# Patient Record
Sex: Female | Born: 1990 | Race: White | Hispanic: No | Marital: Married | Smoking: Never smoker
Health system: Southern US, Community
[De-identification: ages and names within clinical notes are randomized; demographics above are authoritative.]

## PROBLEM LIST (undated history)

## (undated) DIAGNOSIS — E079 Disorder of thyroid, unspecified: Secondary | ICD-10-CM

## (undated) DIAGNOSIS — K219 Gastro-esophageal reflux disease without esophagitis: Secondary | ICD-10-CM

## (undated) DIAGNOSIS — I10 Essential (primary) hypertension: Secondary | ICD-10-CM

## (undated) DIAGNOSIS — J309 Allergic rhinitis, unspecified: Secondary | ICD-10-CM

## (undated) DIAGNOSIS — E039 Hypothyroidism, unspecified: Secondary | ICD-10-CM

## (undated) DIAGNOSIS — O24419 Gestational diabetes mellitus in pregnancy, unspecified control: Secondary | ICD-10-CM

## (undated) HISTORY — PX: NO PAST SURGERIES: SHX2092

## (undated) HISTORY — DX: Gastro-esophageal reflux disease without esophagitis: K21.9

## (undated) HISTORY — DX: Disorder of thyroid, unspecified: E07.9

## (undated) HISTORY — DX: Gestational diabetes mellitus in pregnancy, unspecified control: O24.419

## (undated) HISTORY — DX: Allergic rhinitis, unspecified: J30.9

## (undated) HISTORY — DX: Hypothyroidism, unspecified: E03.9

## (undated) HISTORY — DX: Essential (primary) hypertension: I10

---
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
## 2012-04-29 ENCOUNTER — Emergency Department (HOSPITAL_COMMUNITY): Payer: 59

## 2012-04-29 ENCOUNTER — Encounter (HOSPITAL_COMMUNITY): Payer: Self-pay | Admitting: *Deleted

## 2012-04-29 ENCOUNTER — Emergency Department (HOSPITAL_COMMUNITY)
Admission: EM | Admit: 2012-04-29 | Discharge: 2012-04-29 | Disposition: A | Discharge status: Home / Self Care | Payer: 59 | Attending: Emergency Medicine | Admitting: Emergency Medicine

## 2012-04-29 DIAGNOSIS — J329 Chronic sinusitis, unspecified: Secondary | ICD-10-CM | POA: Insufficient documentation

## 2012-04-29 DIAGNOSIS — J069 Acute upper respiratory infection, unspecified: Secondary | ICD-10-CM | POA: Insufficient documentation

## 2012-04-29 MED ORDER — PHENYLEPHRINE-CHLORPHEN-DM 12.5-4-15 MG/5ML PO SYRP: 5.0000 mL | ORAL_SOLUTION | ORAL | Status: DC

## 2012-04-29 MED ORDER — PREDNISONE 10 MG PO TABS: 20.0000 mg | ORAL_TABLET | Freq: Every day | ORAL | Status: DC

## 2012-04-29 MED ORDER — PSEUDOEPHEDRINE HCL 60 MG PO TABS
60.0000 mg | ORAL_TABLET | Freq: Once | ORAL | Status: AC
  Administered 2012-04-29: 60 mg via ORAL
  Filled 2012-04-29: qty 1

## 2012-04-29 MED ORDER — PREDNISONE 50 MG PO TABS
60.0000 mg | ORAL_TABLET | Freq: Once | ORAL | Status: AC
  Administered 2012-04-29: 60 mg via ORAL
  Filled 2012-04-29: qty 1

## 2012-04-29 NOTE — ED Notes (Signed)
Pt states a cough for about a month but last 2 weeks cough productive of green and brown phlegm, possible fever yesterday per pt., lungs clear

## 2012-04-29 NOTE — ED Provider Notes (Signed)
History     CSN: [id number]  Arrival date & time 04/29/12  1339   First MD Initiated Contact with Patient 04/29/12 1346      Chief Complaint  Patient presents with  . Cough    (Consider location/radiation/quality/duration/timing/severity/associated sxs/prior treatment) Patient is a 22 y.o. female presenting with cough. The history is provided by the patient.  Cough Cough characteristics:  Productive Sputum characteristics:  Nondescript Severity:  Moderate Onset quality:  Gradual   History reviewed. No pertinent past medical history.  History reviewed. No pertinent past surgical history.  No family history on file.  History  Substance Use Topics  . Smoking status: Never Smoker   . Smokeless tobacco: Not on file  . Alcohol Use: No    OB History   Grav Para Term Preterm Abortions TAB SAB Ect Mult Living                  Review of Systems  Respiratory: Positive for cough.     Allergies  Review of patient's allergies indicates no known allergies.  Home Medications   Current Outpatient Rx  Name  Route  Sig  Dispense  Refill  . guaifenesin (ROBITUSSIN) 100 MG/5ML syrup   Oral   Take 200 mg by mouth 3 (three) times daily as needed for cough.         Marland Kitchen OVER THE COUNTER MEDICATION   Oral   Take 1 tablet by mouth daily as needed (for allergies). OTC allergy medication         . chlorpheniramine-phenylephrine-dextromethorphan (RONDEC DM) 12.05-20-13 MG/5ML SYRP   Oral   Take 5 mLs by mouth every 4 (four) hours.   100 mL   0   . predniSONE (DELTASONE) 10 MG tablet   Oral   Take 2 tablets (20 mg total) by mouth daily.   15 tablet   0     BP 123/73  Pulse 95  Temp(Src) 97.8 F (36.6 C) (Oral)  Resp 16  Ht 5\' 6"  (1.676 m)  Wt 250 lb (113.399 kg)  BMI 40.37 kg/m2  SpO2 99%  LMP 04/26/2012  Physical Exam  Nursing note and vitals reviewed. Constitutional: She is oriented to person, place, and time. She appears well-developed and well-nourished.   Non-toxic appearance.  HENT:  Head: Normocephalic.  Right Ear: Tympanic membrane and external ear normal.  Left Ear: Tympanic membrane and external ear normal.  Nasal congestion present. Airway patent.  Eyes: EOM and lids are normal. Pupils are equal, round, and reactive to light.  Neck: Normal range of motion. Neck supple. Carotid bruit is not present.  Cardiovascular: Normal rate, regular rhythm, normal heart sounds, intact distal pulses and normal pulses.   Pulmonary/Chest: Breath sounds normal. No respiratory distress.  Abdominal: Soft. Bowel sounds are normal. There is no tenderness. There is no guarding.  Musculoskeletal: Normal range of motion.  Lymphadenopathy:       Head (right side): No submandibular adenopathy present.       Head (left side): No submandibular adenopathy present.    She has no cervical adenopathy.  Neurological: She is alert and oriented to person, place, and time. She has normal strength. No cranial nerve deficit or sensory deficit.  Skin: Skin is warm and dry.  Psychiatric: She has a normal mood and affect. Her speech is normal.    ED Course  Procedures (including critical care time)  Labs Reviewed - No data to display Dg Chest 2 View  04/29/2012  *RADIOLOGY REPORT*  Clinical Data: Cough and congestion.  CHEST - 2 VIEW  Comparison:  None.  Findings:  The heart size and mediastinal contours are within normal limits.  Both lungs are clear.  The visualized skeletal structures are unremarkable.  IMPRESSION: No active cardiopulmonary disease.   Original Report Authenticated By: Richarda Overlie, M.D.      1. Sinusitis   2. URI (upper respiratory infection)       MDM  I have reviewed nursing notes, vital signs, and all appropriate lab and imaging results for this patient. Chest xray is negative for acute event. Pulse ox 99% on room air. Pt able to speak in complete sentences. Plan- Rx for Rondec DM and prednisone. Pt to return if not improving.  04/30/12 -- Pt  return to ED because pharmacy no longer carries Rondec DM. Rx for sudafed and Promethazine-codeine given to the patient.       Kathie Dike, PA-C 04/30/12 1949

## 2012-04-29 NOTE — ED Notes (Signed)
Cough x ~ 1 mo. Recently seen by PMD.

## 2012-05-01 NOTE — ED Provider Notes (Signed)
Medical screening examination/treatment/procedure(s) were performed by non-physician practitioner and as supervising physician I was immediately available for consultation/collaboration.   Bradley Bostelman W Jakaria Lavergne, MD 05/01/12 0724 

## 2012-05-19 ENCOUNTER — Emergency Department (HOSPITAL_COMMUNITY): Payer: 59

## 2012-05-19 ENCOUNTER — Emergency Department (HOSPITAL_COMMUNITY)
Admission: EM | Admit: 2012-05-19 | Discharge: 2012-05-19 | Disposition: A | Discharge status: Home / Self Care | Payer: 59 | Attending: Emergency Medicine | Admitting: Emergency Medicine

## 2012-05-19 ENCOUNTER — Encounter (HOSPITAL_COMMUNITY): Payer: Self-pay | Admitting: Emergency Medicine

## 2012-05-19 DIAGNOSIS — S2341XA Sprain of ribs, initial encounter: Secondary | ICD-10-CM | POA: Insufficient documentation

## 2012-05-19 DIAGNOSIS — R05 Cough: Secondary | ICD-10-CM | POA: Insufficient documentation

## 2012-05-19 DIAGNOSIS — IMO0002 Reserved for concepts with insufficient information to code with codable children: Secondary | ICD-10-CM | POA: Insufficient documentation

## 2012-05-19 DIAGNOSIS — S46912A Strain of unspecified muscle, fascia and tendon at shoulder and upper arm level, left arm, initial encounter: Secondary | ICD-10-CM

## 2012-05-19 DIAGNOSIS — S29011A Strain of muscle and tendon of front wall of thorax, initial encounter: Secondary | ICD-10-CM

## 2012-05-19 DIAGNOSIS — Y9289 Other specified places as the place of occurrence of the external cause: Secondary | ICD-10-CM | POA: Insufficient documentation

## 2012-05-19 DIAGNOSIS — X503XXA Overexertion from repetitive movements, initial encounter: Secondary | ICD-10-CM | POA: Insufficient documentation

## 2012-05-19 DIAGNOSIS — Y9389 Activity, other specified: Secondary | ICD-10-CM | POA: Insufficient documentation

## 2012-05-19 DIAGNOSIS — R059 Cough, unspecified: Secondary | ICD-10-CM | POA: Insufficient documentation

## 2012-05-19 DIAGNOSIS — X500XXA Overexertion from strenuous movement or load, initial encounter: Secondary | ICD-10-CM | POA: Insufficient documentation

## 2012-05-19 MED ORDER — IBUPROFEN 600 MG PO TABS: 600.0000 mg | ORAL_TABLET | Freq: Four times a day (QID) | ORAL | Status: DC | PRN

## 2012-05-19 NOTE — ED Notes (Signed)
Patient states that she is having left shoulder pain that started 2 days ago.  States that she did have a URI about 2 weeks ago, states that she has had an increased cough over the last several days.

## 2012-05-19 NOTE — ED Notes (Addendum)
Pt c/o left shoulder pain the radiates to upper back and left arm since yesterday.Pt denies injury. No deformity present.

## 2012-05-19 NOTE — ED Provider Notes (Signed)
History     CSN: [id number]  Arrival date & time 05/19/12  1227   First MD Initiated Contact with Patient 05/19/12 1252      Chief Complaint  Patient presents with  . Shoulder Pain    (Consider location/radiation/quality/duration/timing/severity/associated sxs/prior treatment) HPI Comments: Cheyenne Moore is a 22 y.o. Female presenting with a 2 day history of left shoulder pain which is intermittently worse with range of motion.  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  She does report having a URI several weeks ago with significant cough and upper respiratory congestion which had resolved, but she noticed a mild nonproductive cough which returned 2 days ago.  She denies shortness of breath and current nasal congestion, has had no sore throat, facial pain or pressure.  Her cough the past 2 days has been mild and nonproductive.  She has taken ibuprofen with  transient relief of her symptoms.   The history is provided by the patient.    History reviewed. No pertinent past medical history.  History reviewed. No pertinent past surgical history.  No family history on file.  History  Substance Use Topics  . Smoking status: Never Smoker   . Smokeless tobacco: Not on file  . Alcohol Use: No    OB History   Grav Para Term Preterm Abortions TAB SAB Ect Mult Living                  Review of Systems  Constitutional: Negative for fever.  HENT: Negative for congestion, sore throat, rhinorrhea, neck pain and sinus pressure.   Eyes: Negative.   Respiratory: Positive for cough. Negative for chest tightness and shortness of breath.   Cardiovascular: Negative for chest pain.  Gastrointestinal: Negative for nausea and abdominal pain.  Genitourinary: Negative.   Musculoskeletal: Positive for arthralgias. Negative for joint swelling.  Skin: Negative.  Negative for color change, rash and wound.    Neurological: Negative for dizziness, weakness, light-headedness, numbness and headaches.  Psychiatric/Behavioral: Negative.     Allergies  Review of patient's allergies indicates no known allergies.  Home Medications   Current Outpatient Rx  Name  Route  Sig  Dispense  Refill  . chlorpheniramine-phenylephrine-dextromethorphan (RONDEC DM) 12.05-20-13 MG/5ML SYRP   Oral   Take 5 mLs by mouth every 4 (four) hours.   100 mL   0   . ibuprofen (ADVIL,MOTRIN) 200 MG tablet   Oral   Take 400 mg by mouth every 6 (six) hours as needed for pain.         Marland Kitchen ibuprofen (ADVIL,MOTRIN) 600 MG tablet   Oral   Take 1 tablet (600 mg total) by mouth every 6 (six) hours as needed for pain.   30 tablet   0     BP 128/77  Pulse 78  Temp(Src) 97.9 F (36.6 C) (Oral)  Resp 20  Ht 5\' 6"  (1.676 m)  Wt 250 lb (113.399 kg)  BMI 40.37 kg/m2  SpO2 100%  LMP 04/26/2012  Physical Exam  Nursing note and vitals reviewed. Constitutional: She appears well-developed and well-nourished.  HENT:  Head: Normocephalic and atraumatic.  Eyes: Conjunctivae are normal.  Neck: Normal range of motion.  Cardiovascular: Normal rate, regular rhythm, normal heart sounds and intact distal pulses.   Pulses:      Radial pulses are 2+ on the right side, and 2+ on the left side.  Pulmonary/Chest: Effort  normal and breath sounds normal. No respiratory distress. She has no wheezes. She has no rales.    Mild reproducible  left upper chest wall and left humeral head tenderness to palpation.  No crepitus with range of motion of left shoulder which also reproduces pain.    Abdominal: Soft. Bowel sounds are normal. There is no tenderness.  Musculoskeletal: Normal range of motion.       Left shoulder: She exhibits tenderness and pain. She exhibits no swelling, no effusion, no crepitus, no spasm, normal pulse and normal strength.  Neurological: She is alert.  Skin: Skin is warm and dry.  Psychiatric: She has a normal mood  and affect.    ED Course  Procedures (including critical care time)  Labs Reviewed - No data to display Dg Chest 2 View  05/19/2012  *RADIOLOGY REPORT*  Clinical Data: Pain, cough  CHEST - 2 VIEW  Comparison: 04/29/2012  Findings: Cardiomediastinal silhouette is stable.  No acute infiltrate or pleural effusion.  No pulmonary edema.  Bony thorax is stable.  IMPRESSION: No active disease.  No significant change.   Original Report Authenticated By: Natasha Mead, M.D.      1. Strain of chest wall, initial encounter   2. Shoulder strain, left, initial encounter       MDM  Patient was encouraged to continue ibuprofen she was prescribed prescription strength 600 mg strength.  Also encouraged heating pad applied 20 minutes several times daily.  Rest the left shoulder joint.  Followup with PCP for recheck if not improving over the next week.  Patient is PERC negative and also meets Wells criteria to r/o dvt.  There is no exam findings consistent with DVT, doubt PE.  Patients labs and/or radiological studies were viewed and considered during the medical decision making and disposition process.         Burgess Amor, PA-C 05/19/12 1457

## 2012-05-20 NOTE — ED Provider Notes (Signed)
Medical screening examination/treatment/procedure(s) were performed by non-physician practitioner and as supervising physician I was immediately available for consultation/collaboration.  Flint Melter, MD 05/20/12 1105

## 2015-02-18 DIAGNOSIS — H52223 Regular astigmatism, bilateral: Secondary | ICD-10-CM | POA: Diagnosis not present

## 2015-05-27 ENCOUNTER — Encounter (HOSPITAL_COMMUNITY): Payer: Self-pay

## 2015-05-27 ENCOUNTER — Emergency Department (HOSPITAL_COMMUNITY): Payer: 59

## 2015-05-27 ENCOUNTER — Emergency Department (HOSPITAL_COMMUNITY)
Admission: EM | Admit: 2015-05-27 | Discharge: 2015-05-27 | Disposition: A | Discharge status: Home / Self Care | Payer: 59 | Attending: Emergency Medicine | Admitting: Emergency Medicine

## 2015-05-27 DIAGNOSIS — S5002XA Contusion of left elbow, initial encounter: Secondary | ICD-10-CM | POA: Diagnosis not present

## 2015-05-27 DIAGNOSIS — Y999 Unspecified external cause status: Secondary | ICD-10-CM | POA: Diagnosis not present

## 2015-05-27 DIAGNOSIS — Y939 Activity, unspecified: Secondary | ICD-10-CM | POA: Diagnosis not present

## 2015-05-27 DIAGNOSIS — Z79899 Other long term (current) drug therapy: Secondary | ICD-10-CM | POA: Diagnosis not present

## 2015-05-27 DIAGNOSIS — S50312A Abrasion of left elbow, initial encounter: Secondary | ICD-10-CM | POA: Diagnosis not present

## 2015-05-27 DIAGNOSIS — M7989 Other specified soft tissue disorders: Secondary | ICD-10-CM | POA: Diagnosis not present

## 2015-05-27 DIAGNOSIS — S59902A Unspecified injury of left elbow, initial encounter: Secondary | ICD-10-CM | POA: Diagnosis not present

## 2015-05-27 DIAGNOSIS — M79602 Pain in left arm: Secondary | ICD-10-CM | POA: Diagnosis present

## 2015-05-27 DIAGNOSIS — S59912A Unspecified injury of left forearm, initial encounter: Secondary | ICD-10-CM | POA: Diagnosis not present

## 2015-05-27 DIAGNOSIS — Y929 Unspecified place or not applicable: Secondary | ICD-10-CM | POA: Diagnosis not present

## 2015-05-27 DIAGNOSIS — W1839XA Other fall on same level, initial encounter: Secondary | ICD-10-CM | POA: Diagnosis not present

## 2015-05-27 NOTE — ED Notes (Signed)
Pt reports she slipped on a wet ramp today and hit left elbow on deck.

## 2015-05-27 NOTE — ED Provider Notes (Signed)
CSN: [id number]     Arrival date & time 05/27/15  1658 History   First MD Initiated Contact with Patient 05/27/15 1807     Chief Complaint  Patient presents with  . Arm Pain     (Consider location/radiation/quality/duration/timing/severity/associated sxs/prior Treatment) Patient is a 25 y.o. female presenting with arm pain. The history is provided by the patient. No language interpreter was used.  Arm Pain This is a new problem. The current episode started today. The problem occurs constantly. The problem has been unchanged. Nothing aggravates the symptoms. She has tried nothing for the symptoms. The treatment provided moderate relief.  Pt complains of falling on a wet ramp and hitting her elbow this am.  Pt reports some pain relief with tylenol.    History reviewed. No pertinent past medical history. History reviewed. No pertinent past surgical history. No family history on file. Social History  Substance Use Topics  . Smoking status: Never Smoker   . Smokeless tobacco: None  . Alcohol Use: No   OB History    No data available     Review of Systems  All other systems reviewed and are negative.     Allergies  Review of patient's allergies indicates no known allergies.  Home Medications   Prior to Admission medications   Medication Sig Start Date End Date Taking? Authorizing Provider  acetaminophen (TYLENOL) 500 MG tablet Take 500 mg by mouth every 6 (six) hours as needed for mild pain or moderate pain.   Yes Historical Provider, MD   BP 131/87 mmHg  Pulse 71  Temp(Src) 98.1 F (36.7 C) (Oral)  Resp 20  Ht 5\' 6"  (1.676 m)  Wt 113.399 kg  BMI 40.37 kg/m2  SpO2 100%  LMP 05/17/2015 Physical Exam  Constitutional: She is oriented to person, place, and time. She appears well-developed and well-nourished.  HENT:  Head: Normocephalic.  Eyes: EOM are normal.  Neck: Normal range of motion.  Abdominal: She exhibits no distension.  Musculoskeletal: She exhibits  tenderness.  Abrasion left elbow,  Tender diffusely to palpation,  nv and ns intact  Neurological: She is alert and oriented to person, place, and time.  Skin: Skin is warm.  Psychiatric: She has a normal mood and affect.  Nursing note and vitals reviewed.   ED Course  Procedures (including critical care time) Labs Review Labs Reviewed - No data to display  Imaging Review Dg Elbow Complete Left  05/27/2015  CLINICAL DATA:  The patient suffered a slip and fall today on a wet ramp resulting in a blow to the left elbow. Pain and swelling. Initial encounter. EXAM: LEFT ELBOW - COMPLETE 3+ VIEW COMPARISON:  None. FINDINGS: There is no evidence of fracture, dislocation, or joint effusion. There is no evidence of arthropathy or other focal bone abnormality. Soft tissues are unremarkable. IMPRESSION: Negative exam. Electronically Signed   By: Drusilla Kannerhomas  Dalessio M.D.   On: 05/27/2015 17:27   Dg Forearm Left  05/27/2015  CLINICAL DATA:  The patient suffered a slip and fall on a wet ramp today with a blow to the left forearm. Pain and swelling. Initial encounter. EXAM: LEFT FOREARM - 2 VIEW COMPARISON:  None. FINDINGS: There is no evidence of fracture or other focal bone lesions. Soft tissues are unremarkable. IMPRESSION: Negative exam. Electronically Signed   By: Drusilla Kannerhomas  Dalessio M.D.   On: 05/27/2015 17:28   I have personally reviewed and evaluated these images and lab results as part of my medical decision-making.   EKG  Interpretation None      MDM   Final diagnoses:  Contusion of left elbow, initial encounter    An After Visit Summary was printed and given to the patient. Meds ordered this encounter  Medications  . acetaminophen (TYLENOL) 500 MG tablet    Sig: Take 500 mg by mouth every 6 (six) hours as needed for mild pain or moderate pain.     Elson Areas, PA-C 05/27/15 1911  Elson Areas, PA-C 05/27/15 1943  Elson Areas, PA-C 05/27/15 1943  Lonia Skinner Drain,  PA-C 05/27/15 1944

## 2015-05-27 NOTE — Discharge Instructions (Signed)
Elbow Contusion °An elbow contusion is a deep bruise of the elbow. Contusions are the result of an injury that caused bleeding under the skin. The contusion may turn blue, purple, or yellow. Minor injuries will give you a painless contusion, but more severe contusions may stay painful and swollen for a few weeks.  °CAUSES  °An elbow contusion comes from a direct force to that area, such as falling on the elbow. °SYMPTOMS  °· Swelling and redness of the elbow. °· Bruising of the elbow area. °· Tenderness or soreness of the elbow. °DIAGNOSIS  °You will have a physical exam and will be asked about your history. You may need an X-ray of your elbow to look for a broken bone (fracture).  °TREATMENT  °A sling or splint may be needed to support your injury. Resting, elevating, and applying cold compresses to the elbow area are often the best treatments for an elbow contusion. Over-the-counter medicines may also be recommended for pain control. °HOME CARE INSTRUCTIONS  °· Put ice on the injured area. °¨ Put ice in a plastic bag. °¨ Place a towel between your skin and the bag. °¨ Leave the ice on for 15-20 minutes, 03-04 times a day. °· Only take over-the-counter or prescription medicines for pain, discomfort, or fever as directed by your caregiver. °· Rest your injured elbow until the pain and swelling are better. °· Elevate your elbow to reduce swelling. °· Apply a compression wrap as directed by your caregiver. This can help reduce swelling and motion. You may remove the wrap for sleeping, showers, and baths. If your fingers become numb, cold, or blue, take the wrap off and reapply it more loosely. °· Use your elbow only as directed by your caregiver. You may be asked to do range of motion exercises. Do them as directed. °· See your caregiver as directed. It is very important to keep all follow-up appointments in order to avoid any long-term problems with your elbow, including chronic pain or inability to move your elbow  normally. °SEEK IMMEDIATE MEDICAL CARE IF:  °· You have increased redness, swelling, or pain in your elbow. °· Your swelling or pain is not relieved with medicines. °· You have swelling of the hand and fingers. °· You are unable to move your fingers or wrist. °· You begin to lose feeling in your hand or fingers. °· Your fingers or hand become cold or blue. °MAKE SURE YOU:  °· Understand these instructions. °· Will watch your condition. °· Will get help right away if you are not doing well or get worse. °  °This information is not intended to replace advice given to you by your health care provider. Make sure you discuss any questions you have with your health care provider. °  °Document Released: 12/12/2005 Document Revised: 03/28/2011 Document Reviewed: 08/18/2014 °Elsevier Interactive Patient Education ©2016 [employer] ° °

## 2015-08-05 NOTE — ED Provider Notes (Signed)
Medical screening examination/treatment/procedure(s) were performed by non-physician practitioner and as supervising physician I was immediately available for consultation/collaboration.   EKG Interpretation None       Felicia Bloomquist, MD 08/05/15 1604 

## 2016-06-15 ENCOUNTER — Encounter: Payer: Self-pay | Admitting: *Deleted

## 2016-06-15 DIAGNOSIS — K219 Gastro-esophageal reflux disease without esophagitis: Secondary | ICD-10-CM | POA: Diagnosis not present

## 2016-06-15 DIAGNOSIS — Z0000 Encounter for general adult medical examination without abnormal findings: Secondary | ICD-10-CM | POA: Diagnosis not present

## 2016-06-15 DIAGNOSIS — R5383 Other fatigue: Secondary | ICD-10-CM | POA: Diagnosis not present

## 2016-06-15 DIAGNOSIS — Z0389 Encounter for observation for other suspected diseases and conditions ruled out: Secondary | ICD-10-CM | POA: Diagnosis not present

## 2016-06-15 DIAGNOSIS — Z13220 Encounter for screening for lipoid disorders: Secondary | ICD-10-CM | POA: Diagnosis not present

## 2016-06-15 DIAGNOSIS — E559 Vitamin D deficiency, unspecified: Secondary | ICD-10-CM | POA: Diagnosis not present

## 2016-06-15 DIAGNOSIS — J301 Allergic rhinitis due to pollen: Secondary | ICD-10-CM | POA: Diagnosis not present

## 2016-07-04 ENCOUNTER — Encounter: Payer: Self-pay | Admitting: Adult Health

## 2016-07-04 DIAGNOSIS — R5383 Other fatigue: Secondary | ICD-10-CM | POA: Diagnosis not present

## 2016-07-04 DIAGNOSIS — Z713 Dietary counseling and surveillance: Secondary | ICD-10-CM | POA: Diagnosis not present

## 2016-07-04 DIAGNOSIS — H66009 Acute suppurative otitis media without spontaneous rupture of ear drum, unspecified ear: Secondary | ICD-10-CM | POA: Diagnosis not present

## 2016-07-04 DIAGNOSIS — E559 Vitamin D deficiency, unspecified: Secondary | ICD-10-CM | POA: Diagnosis not present

## 2016-07-05 DIAGNOSIS — L940 Localized scleroderma [morphea]: Secondary | ICD-10-CM | POA: Diagnosis not present

## 2016-07-14 ENCOUNTER — Encounter: Payer: Self-pay | Admitting: Adult Health

## 2016-08-04 DIAGNOSIS — R5383 Other fatigue: Secondary | ICD-10-CM | POA: Diagnosis not present

## 2016-08-04 DIAGNOSIS — E559 Vitamin D deficiency, unspecified: Secondary | ICD-10-CM | POA: Diagnosis not present

## 2016-10-26 ENCOUNTER — Encounter (HOSPITAL_COMMUNITY): Payer: Self-pay

## 2016-10-26 ENCOUNTER — Emergency Department (HOSPITAL_COMMUNITY): Payer: No Typology Code available for payment source

## 2016-10-26 ENCOUNTER — Emergency Department (HOSPITAL_COMMUNITY)
Admission: EM | Admit: 2016-10-26 | Discharge: 2016-10-26 | Disposition: A | Discharge status: Home / Self Care | Payer: No Typology Code available for payment source | Attending: Emergency Medicine | Admitting: Emergency Medicine

## 2016-10-26 DIAGNOSIS — S0990XA Unspecified injury of head, initial encounter: Secondary | ICD-10-CM | POA: Diagnosis present

## 2016-10-26 DIAGNOSIS — S3981XA Other specified injuries of abdomen, initial encounter: Secondary | ICD-10-CM | POA: Diagnosis not present

## 2016-10-26 DIAGNOSIS — Y92410 Unspecified street and highway as the place of occurrence of the external cause: Secondary | ICD-10-CM | POA: Insufficient documentation

## 2016-10-26 DIAGNOSIS — Y999 Unspecified external cause status: Secondary | ICD-10-CM | POA: Insufficient documentation

## 2016-10-26 DIAGNOSIS — S0083XA Contusion of other part of head, initial encounter: Secondary | ICD-10-CM | POA: Insufficient documentation

## 2016-10-26 DIAGNOSIS — Y9389 Activity, other specified: Secondary | ICD-10-CM | POA: Insufficient documentation

## 2016-10-26 DIAGNOSIS — S3991XA Unspecified injury of abdomen, initial encounter: Secondary | ICD-10-CM

## 2016-10-26 LAB — PREGNANCY, URINE: Preg Test, Ur: NEGATIVE

## 2016-10-26 MED ORDER — IOPAMIDOL (ISOVUE-300) INJECTION 61%
100.0000 mL | Freq: Once | INTRAVENOUS | Status: AC | PRN
  Administered 2016-10-26: 100 mL via INTRAVENOUS

## 2016-10-26 MED ORDER — SODIUM CHLORIDE 0.9 % IV BOLUS (SEPSIS)
500.0000 mL | Freq: Once | INTRAVENOUS | Status: AC
  Administered 2016-10-26: 500 mL via INTRAVENOUS
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
## 2016-12-09 ENCOUNTER — Emergency Department (HOSPITAL_COMMUNITY)
Admission: EM | Admit: 2016-12-09 | Discharge: 2016-12-09 | Disposition: A | Discharge status: Home / Self Care | Payer: Self-pay | Attending: Emergency Medicine | Admitting: Emergency Medicine

## 2016-12-09 ENCOUNTER — Emergency Department (HOSPITAL_COMMUNITY): Payer: Self-pay

## 2016-12-09 ENCOUNTER — Other Ambulatory Visit: Payer: Self-pay

## 2016-12-09 ENCOUNTER — Encounter (HOSPITAL_COMMUNITY): Payer: Self-pay | Admitting: Emergency Medicine

## 2016-12-09 DIAGNOSIS — Z79899 Other long term (current) drug therapy: Secondary | ICD-10-CM | POA: Insufficient documentation

## 2016-12-09 DIAGNOSIS — M25562 Pain in left knee: Secondary | ICD-10-CM | POA: Insufficient documentation

## 2016-12-09 DIAGNOSIS — X501XXA Overexertion from prolonged static or awkward postures, initial encounter: Secondary | ICD-10-CM | POA: Insufficient documentation

## 2016-12-09 MED ORDER — IBUPROFEN 600 MG PO TABS: 600.0000 mg | ORAL_TABLET | Freq: Four times a day (QID) | ORAL | 0 refills | Status: DC

## 2016-12-09 NOTE — Discharge Instructions (Signed)
As discussed, your x-rays are negative today.  I suspect you may have a mild patellar tendon strain, but cannot rule out the possibility of cartilage injury of your meniscus.  Avoid any activities that worsen your pain, especially kneeling, prolonged standing and avoid flexing your knee more than 90 degrees.  Wear the Ace wrap for comfort.  Apply heat 20 minutes 3 times daily or as needed for symptom relief.  Follow-up with Dr. Romeo AppleHarrison for further management if these simple treatments do not improve your symptoms.

## 2016-12-09 NOTE — ED Triage Notes (Signed)
Pt c/o left knee pain.denies injury. Took ibuprofen with some relief. Pain x 2 weeks. Nad. Pt has mild limp due to pain.
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
## 2016-12-10 NOTE — ED Provider Notes (Signed)
[facility] EMERGENCY DEPARTMENT Provider Note   CSN: [id number] Arrival date & time: 12/09/16  1219     History   Chief Complaint Chief Complaint  Patient presents with  . Knee Pain    HPI Cheyenne Moore is a 26 y.o. female with a history of GERD and allergic rhinitis who is morbidly obese presenting with a 2-week history of left knee pain without injury.  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  She does endorse rather sudden onset of left anterior knee pain which has been persistent despite using ice, rest and ibuprofen.  Her pain is better at rest, worse with movement and especially with extreme flexion.  She has occasional popping in the knee joint.  She denies weakness or collapse of her knee.  She denies prior injury and also denies fevers, swelling, rash or other recent illness.  The history is provided by the patient.    Past Medical History:  Diagnosis Date  . Allergic rhinitis   . GERD (gastroesophageal reflux disease)     There are no active problems to display for this patient.   History reviewed. No pertinent surgical history.  OB History    No data available       Home Medications    Prior to Admission medications   Medication Sig Start Date End Date Taking? Authorizing Provider  cholecalciferol (VITAMIN D) 1000 units tablet Take 1,000 Units by mouth daily.   Yes [provider]  levothyroxine (SYNTHROID, LEVOTHROID) 25 MCG tablet Take 25 mcg by mouth daily. 09/07/16  Yes [provider]  ibuprofen (ADVIL,MOTRIN) 600 MG tablet Take 1 tablet (600 mg total) by mouth every 6 (six) hours. 12/09/16   Burgess AmorIdol, Muriah Harsha, PA-C    Family History Family History  Problem Relation Age of Onset  . Diabetes Father   . Hypertension Father   . Heart failure Father   . Diabetes Mother   . Hypertension Mother      Social History Social History   Tobacco Use  . Smoking status: Never Smoker  . Smokeless tobacco: Never Used  Substance Use Topics  . Alcohol use: No  . Drug use: No     Allergies   Red dye   Review of Systems Review of Systems  Constitutional: Negative for fever.  Musculoskeletal: Positive for arthralgias and joint swelling. Negative for myalgias.  Neurological: Negative for weakness and numbness.     Physical Exam Updated Vital Signs BP 108/67 (BP Location: Right Arm)   Pulse 67   Temp 98.1 F (36.7 C) (Oral)   Resp 18   LMP 12/03/2016   SpO2 100%   Physical Exam  Constitutional: She appears well-developed and well-nourished.  HENT:  Head: Atraumatic.  Neck: Normal range of motion.  Cardiovascular:  Pulses equal bilaterally  Musculoskeletal: She exhibits tenderness.       Left knee: She exhibits abnormal meniscus. She exhibits normal range of motion, no effusion, no ecchymosis, no deformity, no erythema, normal alignment, no LCL laxity, normal patellar mobility and no MCL laxity. Tenderness found. Patellar tendon tenderness noted.  Tenderness along anterior tibial plateau.  There is no deformity.  There is no crepitus with range of motion.  Patient can straight leg raise without increasing knee pain.  There is no tenderness along her quadriceps tendon and there is no defects are no defects to palpation  along her patellar tendon.  Negative drawer test, negative Lachman test, no increased pain with varus and valgus strain.  Neurological: She is alert. She has normal strength. She displays normal reflexes. No sensory deficit.  Skin: Skin is warm and dry.  Psychiatric: She has a normal mood and affect.     ED Treatments / Results  Labs (all labs ordered are listed, but only abnormal results are displayed) Labs Reviewed - No data to display  EKG  EKG Interpretation None       Radiology Dg Knee Complete 4 Views Left  Result Date: 12/09/2016 CLINICAL  DATA:  Left knee pain for 2 weeks without known injury. EXAM: LEFT KNEE - COMPLETE 4+ VIEW COMPARISON:  None. FINDINGS: No evidence of fracture, dislocation, or joint effusion. No evidence of arthropathy or other focal bone abnormality. Soft tissues are unremarkable. IMPRESSION: Normal left knee. Electronically Signed   By: Lupita RaiderJames  Green Jr, M.D.   On: 12/09/2016 13:11    Procedures Procedures (including critical care time)  Medications Ordered in ED Medications - No data to display   Initial Impression / Assessment and Plan / ED Course  I have reviewed the triage vital signs and the nursing notes.  Pertinent labs & imaging results that were available during my care of the patient were reviewed by me and considered in my medical decision making (see chart for details).     Imaging reviewed and discussed with patient.  I suspect she may have either a patellar tendon strain or possible meniscal injury, however she denies any specific injury occurring at the onset of pain.  She was encouraged rest, ice, elevation and anti-inflammatories.  She was given an Ace wrap for compression and referred to orthopedics for further evaluation of her knee pain if it persists.  Final Clinical Impressions(s) / ED Diagnoses   Final diagnoses:  Acute pain of left knee    ED Discharge Orders        Ordered    ibuprofen (ADVIL,MOTRIN) 600 MG tablet  Every 6 hours     12/09/16 1518       Burgess Amordol, Datron Brakebill, PA-C 12/10/16 [id number]    Lavera GuiseLiu, Dana Duo, MD 12/10/16 [phone]

## 2017-06-22 DIAGNOSIS — R5383 Other fatigue: Secondary | ICD-10-CM | POA: Diagnosis not present

## 2017-06-22 DIAGNOSIS — K219 Gastro-esophageal reflux disease without esophagitis: Secondary | ICD-10-CM | POA: Diagnosis not present

## 2017-07-06 ENCOUNTER — Encounter (HOSPITAL_COMMUNITY): Payer: Self-pay | Admitting: Emergency Medicine

## 2017-07-06 ENCOUNTER — Emergency Department (HOSPITAL_COMMUNITY)
Admission: EM | Admit: 2017-07-06 | Discharge: 2017-07-06 | Disposition: A | Discharge status: Home / Self Care | Payer: 59 | Attending: Emergency Medicine | Admitting: Emergency Medicine

## 2017-07-06 DIAGNOSIS — Z79899 Other long term (current) drug therapy: Secondary | ICD-10-CM | POA: Diagnosis not present

## 2017-07-06 DIAGNOSIS — R05 Cough: Secondary | ICD-10-CM | POA: Diagnosis not present

## 2017-07-06 DIAGNOSIS — J40 Bronchitis, not specified as acute or chronic: Secondary | ICD-10-CM | POA: Insufficient documentation

## 2017-07-06 MED ORDER — AZITHROMYCIN 250 MG PO TABS: 250.0000 mg | ORAL_TABLET | Freq: Every day | ORAL | 0 refills | Status: DC

## 2017-07-06 MED ORDER — BENZONATATE 200 MG PO CAPS: 200.0000 mg | ORAL_CAPSULE | Freq: Three times a day (TID) | ORAL | 0 refills | Status: DC | PRN

## 2017-07-06 NOTE — ED Provider Notes (Signed)
[facility] EMERGENCY DEPARTMENT Provider Note   CSN: [id number] Arrival date & time: 07/06/17  1427     History   Chief Complaint Chief Complaint  Patient presents with  . Cough    HPI Cheyenne Moore is a 27 y.o. female who presents to the ED with a cough. The cough started one week ago. Patient reports using OTC medication without relief.   The history is provided by the patient. No language interpreter was used.  Cough  This is a new problem. The current episode started more than 2 days ago. The cough is productive of purulent sputum. The maximum temperature recorded prior to her arrival was 100 to 100.9 F. Associated symptoms include ear congestion, headaches and sore throat. Pertinent negatives include no chest pain, no wheezing and no eye redness. She has tried decongestants for the symptoms. The treatment provided no relief. She is not a smoker.    Past Medical History:  Diagnosis Date  . Allergic rhinitis   . GERD (gastroesophageal reflux disease)     There are no active problems to display for this patient.   History reviewed. No pertinent surgical history.   OB History   None      Home Medications    Prior to Admission medications   Medication Sig Start Date End Date Taking? Authorizing Provider  azithromycin (ZITHROMAX) 250 MG tablet Take 1 tablet (250 mg total) by mouth daily. Take first 2 tablets together, then 1 every day until finished. 07/06/17   Janne Napoleon, NP  benzonatate (TESSALON) 200 MG capsule Take 1 capsule (200 mg total) by mouth 3 (three) times daily as needed for cough. 07/06/17   Janne Napoleon, NP  cholecalciferol (VITAMIN D) 1000 units tablet Take 1,000 Units by mouth daily.    [provider]  ibuprofen (ADVIL,MOTRIN) 600 MG tablet Take 1 tablet (600 mg total) by mouth every 6 (six) hours. 12/09/16   Burgess Amor, PA-C  levothyroxine (SYNTHROID, LEVOTHROID) 25 MCG tablet Take 25 mcg by mouth daily. 09/07/16   [provider]    Family History Family History  Problem Relation Age of Onset  . Diabetes Father   . Hypertension Father   . Heart failure Father   . Diabetes Mother   . Hypertension Mother     Social History Social History   Tobacco Use  . Smoking status: Never Smoker  . Smokeless tobacco: Never Used  Substance Use Topics  . Alcohol use: No  . Drug use: No     Allergies   Red dye   Review of Systems Review of Systems  Constitutional: Positive for fever.  HENT: Positive for congestion, sinus pressure and sore throat.   Eyes: Negative for discharge, redness and itching.  Respiratory: Positive for cough. Negative for wheezing.   Cardiovascular: Negative for chest pain.  Gastrointestinal: Negative for abdominal pain, nausea and vomiting.  Genitourinary: Negative for decreased urine volume, dysuria and urgency.  Musculoskeletal: Negative for back pain and neck stiffness.  Skin: Negative for rash.  Neurological: Positive for headaches. Negative for dizziness.  Hematological: Negative for adenopathy.  Psychiatric/Behavioral: Negative for confusion.     Physical Exam Updated Vital Signs BP 139/90 (BP Location: Right Arm)   Pulse 84   Temp 97.9 F (36.6 C) (Oral)   Resp 18   Ht 5\' 6"  (1.676 m)   Wt 129.3 kg (285 lb)   LMP 06/22/2017   SpO2 100%   BMI 46.00 kg/m   Physical  Exam  Constitutional: She is oriented to person, place, and time. She appears well-developed and well-nourished. No distress.  HENT:  Head: Normocephalic and atraumatic.  Right Ear: Ear canal normal. Tympanic membrane is retracted.  Left Ear: Ear canal normal. Tympanic membrane is retracted.  Nose: Rhinorrhea present.  Mouth/Throat: Uvula is midline and mucous membranes are normal. Posterior oropharyngeal erythema present. No posterior oropharyngeal edema.  Eyes: Pupils are equal, round, and reactive to light. Conjunctivae and EOM are normal.  Neck: Neck supple.  Cardiovascular: Normal  rate and regular rhythm.  Pulmonary/Chest: Effort normal and breath sounds normal. She has no wheezes. She has no rales.  Musculoskeletal: Normal range of motion.  Neurological: She is alert and oriented to person, place, and time. No cranial nerve deficit.  Skin: Skin is warm and dry.  Psychiatric: She has a normal mood and affect. Her behavior is normal.  Nursing note and vitals reviewed.    ED Treatments / Results  Labs (all labs ordered are listed, but only abnormal results are displayed) Labs Reviewed - No data to display Radiology No results found.  Procedures Procedures (including critical care time)  Medications Ordered in ED Medications - No data to display   Initial Impression / Assessment and Plan / ED Course  I have reviewed the triage vital signs and the nursing notes. Patients symptoms are consistent with bronchitis. Will start cough medicationand Z-pak. Patient to use decongestants and cool mist vaporizer. Patient verbalizes understanding and is agreeable with plan. Pt is hemodynamically stable & in NAD prior to dc.  Final Clinical Impressions(s) / ED Diagnoses   Final diagnoses:  Bronchitis    ED Discharge Orders        Ordered    benzonatate (TESSALON) 200 MG capsule  3 times daily PRN     07/06/17 1552    azithromycin (ZITHROMAX) 250 MG tablet  Daily     07/06/17 1553       Kerrie Buffaloeese, Dixie Coppa [place], NP 07/06/17 1556    Donnetta Hutchingook, Brian, MD 07/07/17 [phone]

## 2017-07-06 NOTE — ED Triage Notes (Signed)
Patient complaining of cough and congestion x 1 week.  

## 2017-08-31 MED FILL — NP THYROID 30 MG TABLET: 30 | 30 days supply | Qty: 30 | Fill #0

## 2017-10-18 DIAGNOSIS — R5383 Other fatigue: Secondary | ICD-10-CM | POA: Diagnosis not present

## 2017-10-18 DIAGNOSIS — R590 Localized enlarged lymph nodes: Secondary | ICD-10-CM | POA: Diagnosis not present

## 2017-10-18 MED FILL — NP THYROID 60 MG TABLET: 60 | 30 days supply | Qty: 30 | Fill #0

## 2017-10-26 ENCOUNTER — Other Ambulatory Visit (HOSPITAL_COMMUNITY): Payer: Self-pay | Admitting: Internal Medicine

## 2017-10-26 DIAGNOSIS — R1031 Right lower quadrant pain: Secondary | ICD-10-CM | POA: Diagnosis not present

## 2017-10-26 DIAGNOSIS — R590 Localized enlarged lymph nodes: Secondary | ICD-10-CM | POA: Diagnosis not present

## 2017-10-31 ENCOUNTER — Other Ambulatory Visit: Payer: Self-pay

## 2017-10-31 ENCOUNTER — Encounter: Payer: Self-pay | Admitting: Obstetrics & Gynecology

## 2017-10-31 ENCOUNTER — Other Ambulatory Visit (HOSPITAL_COMMUNITY): Payer: Self-pay | Admitting: Internal Medicine

## 2017-10-31 ENCOUNTER — Other Ambulatory Visit (HOSPITAL_COMMUNITY)
Admission: RE | Admit: 2017-10-31 | Discharge: 2017-10-31 | Disposition: A | Discharge status: Home / Self Care | Payer: 59 | Source: Ambulatory Visit | Attending: Obstetrics & Gynecology | Admitting: Obstetrics & Gynecology

## 2017-10-31 ENCOUNTER — Ambulatory Visit (INDEPENDENT_AMBULATORY_CARE_PROVIDER_SITE_OTHER): Payer: 59 | Admitting: Obstetrics & Gynecology

## 2017-10-31 VITALS — BP 127/79 | HR 64 | Ht 66.0 in | Wt 272.0 lb

## 2017-10-31 DIAGNOSIS — Z3202 Encounter for pregnancy test, result negative: Secondary | ICD-10-CM | POA: Diagnosis not present

## 2017-10-31 DIAGNOSIS — Z01419 Encounter for gynecological examination (general) (routine) without abnormal findings: Secondary | ICD-10-CM

## 2017-10-31 LAB — POCT URINE PREGNANCY: Preg Test, Ur: NEGATIVE

## 2017-10-31 MED ORDER — DESOGESTREL-ETHINYL ESTRADIOL 0.15-30 MG-MCG PO TABS: 1.0000 | ORAL_TABLET | Freq: Every day | ORAL | 11 refills | Status: DC

## 2017-10-31 MED ORDER — DESOGESTREL-ETHINYL ESTRADIOL 0.15-30 MG-MCG PO TABS: 1.0000 | ORAL_TABLET | Freq: Every day | ORAL | 4 refills | Status: DC

## 2017-10-31 NOTE — Progress Notes (Signed)
Subjective:     Cheyenne Moore is a 27 y.o. female here for a routine exam.  Patient's last menstrual period was 10/11/2017. G0P0000 Birth Control Method:  None at present Menstrual Calendar(currently): regular 6-7 days of bleeding  Current complaints: none.   Current acute medical issues:  none   Recent Gynecologic History Patient's last menstrual period was 10/11/2017. Last Pap: never had one before,   Last mammogram: ,    Past Medical History:  Diagnosis Date  . Allergic rhinitis   . GERD (gastroesophageal reflux disease)   . Thyroid disease    hypothyroid    History reviewed. No pertinent surgical history.  OB History    Gravida  0   Para  0   Term  0   Preterm  0   AB  0   Living  0     SAB  0   TAB  0   Ectopic  0   Multiple  0   Live Births  0           Social History   Socioeconomic History  . Marital status: Single    Spouse name: Not on file  . Number of children: Not on file  . Years of education: Not on file  . Highest education level: Not on file  Occupational History  . Not on file  Social Needs  . Financial resource strain: Not on file  . Food insecurity:    Worry: Not on file    Inability: Not on file  . Transportation needs:    Medical: Not on file    Non-medical: Not on file  Tobacco Use  . Smoking status: Never Smoker  . Smokeless tobacco: Never Used  Substance and Sexual Activity  . Alcohol use: No  . Drug use: No  . Sexual activity: Yes    Birth control/protection: None, Condom  Lifestyle  . Physical activity:    Days per week: Not on file    Minutes per session: Not on file  . Stress: Not on file  Relationships  . Social connections:    Talks on phone: Not on file    Gets together: Not on file    Attends religious service: Not on file    Active member of club or organization: Not on file    Attends meetings of clubs or organizations: Not on file    Relationship status: Not on file  Other Topics Concern   . Not on file  Social History Narrative  . Not on file    Family History  Problem Relation Age of Onset  . Diabetes Father   . Hypertension Father   . Heart failure Father   . Diabetes Mother   . Hypertension Mother   . Kidney failure Mother   . Cancer Paternal Grandfather        lung  . Cancer Maternal Grandmother        kidney     Current Outpatient Medications:  .  cholecalciferol (VITAMIN D) 1000 units tablet, Take 1,000 Units by mouth daily., Disp: , Rfl:  .  ciprofloxacin (CIPRO) 500 MG tablet, TK 1 T PO BID, Disp: , Rfl: 0 .  ibuprofen (ADVIL,MOTRIN) 600 MG tablet, Take 1 tablet (600 mg total) by mouth every 6 (six) hours., Disp: 30 tablet, Rfl: 0 .  desogestrel-ethinyl estradiol (APRI,EMOQUETTE,SOLIA) 0.15-30 MG-MCG tablet, Take 1 tablet by mouth daily., Disp: 1 Package, Rfl: 11 .  NP THYROID 60 MG tablet, Take 60  mg by mouth daily., Disp: , Rfl: 3  Review of Systems  Review of Systems  Constitutional: Negative for fever, chills, weight loss, malaise/fatigue and diaphoresis.  HENT: Negative for hearing loss, ear pain, nosebleeds, congestion, sore throat, neck pain, tinnitus and ear discharge.   Eyes: Negative for blurred vision, double vision, photophobia, pain, discharge and redness.  Respiratory: Negative for cough, hemoptysis, sputum production, shortness of breath, wheezing and stridor.   Cardiovascular: Negative for chest pain, palpitations, orthopnea, claudication, leg swelling and PND.  Gastrointestinal: negative for abdominal pain. Negative for heartburn, nausea, vomiting, diarrhea, constipation, blood in stool and melena.  Genitourinary: Negative for dysuria, urgency, frequency, hematuria and flank pain.  Musculoskeletal: Negative for myalgias, back pain, joint pain and falls.  Skin: Negative for itching and rash.  Neurological: Negative for dizziness, tingling, tremors, sensory change, speech change, focal weakness, seizures, loss of consciousness, weakness  and headaches.  Endo/Heme/Allergies: Negative for environmental allergies and polydipsia. Does not bruise/bleed easily.  Psychiatric/Behavioral: Negative for depression, suicidal ideas, hallucinations, memory loss and substance abuse. The patient is not nervous/anxious and does not have insomnia.        Objective:  Blood pressure 127/79, pulse 64, height 5\' 6"  (1.676 m), weight 272 lb (123.4 kg), last menstrual period 10/11/2017.   Physical Exam  Vitals reviewed. Constitutional: She is oriented to person, place, and time. She appears well-developed and well-nourished.  HENT:  Head: Normocephalic and atraumatic.        Right Ear: External ear normal.  Left Ear: External ear normal.  Nose: Nose normal.  Mouth/Throat: Oropharynx is clear and moist.  Eyes: Conjunctivae and EOM are normal. Pupils are equal, round, and reactive to light. Right eye exhibits no discharge. Left eye exhibits no discharge. No scleral icterus.  Neck: Normal range of motion. Neck supple. No tracheal deviation present. No thyromegaly present.  Cardiovascular: Normal rate, regular rhythm, normal heart sounds and intact distal pulses.  Exam reveals no gallop and no friction rub.   No murmur heard. Respiratory: Effort normal and breath sounds normal. No respiratory distress. She has no wheezes. She has no rales. She exhibits no tenderness.  GI: Soft. Bowel sounds are normal. She exhibits no distension and no mass. There is no tenderness. There is no rebound and no guarding.  Genitourinary:  Breasts no masses skin changes or nipple changes bilaterally      Vulva is normal without lesions Vagina is pink moist without discharge Cervix normal in appearance and pap is done Uterus is normal size shape and contour Adnexa is negative with normal sized ovaries   Musculoskeletal: Normal range of motion. She exhibits no edema and no tenderness.  Neurological: She is alert and oriented to person, place, and time. She has normal  reflexes. She displays normal reflexes. No cranial nerve deficit. She exhibits normal muscle tone. Coordination normal.  Skin: Skin is warm and dry. No rash noted. No erythema. No pallor.  Psychiatric: She has a normal mood and affect. Her behavior is normal. Judgment and thought content normal.       Medications Ordered at today's visit: Meds ordered this encounter  Medications  . desogestrel-ethinyl estradiol (APRI,EMOQUETTE,SOLIA) 0.15-30 MG-MCG tablet    Sig: Take 1 tablet by mouth daily.    Dispense:  1 Package    Refill:  11    Other orders placed at today's visit: Orders Placed This Encounter  Procedures  . POCT urine pregnancy      Assessment:    Healthy female exam.  Plan:    Contraception: OCP (estrogen/progesterone). Follow up in: 1 year.     Return in about 1 year (around 11/01/2018) for yearly.

## 2017-11-02 LAB — CYTOLOGY - PAP
Diagnosis: NEGATIVE
HPV: NOT DETECTED

## 2017-11-14 ENCOUNTER — Ambulatory Visit (HOSPITAL_COMMUNITY)
Admission: RE | Admit: 2017-11-14 | Discharge: 2017-11-14 | Disposition: A | Discharge status: Home / Self Care | Payer: 59 | Source: Ambulatory Visit | Attending: Internal Medicine | Admitting: Internal Medicine

## 2017-11-14 DIAGNOSIS — R1031 Right lower quadrant pain: Secondary | ICD-10-CM | POA: Insufficient documentation

## 2017-11-14 DIAGNOSIS — K439 Ventral hernia without obstruction or gangrene: Secondary | ICD-10-CM | POA: Insufficient documentation

## 2017-11-14 DIAGNOSIS — R590 Localized enlarged lymph nodes: Secondary | ICD-10-CM | POA: Diagnosis not present

## 2017-11-14 MED ORDER — IOPAMIDOL (ISOVUE-300) INJECTION 61%
100.0000 mL | Freq: Once | INTRAVENOUS | Status: AC | PRN
  Administered 2017-11-14: 100 mL via INTRAVENOUS

## 2017-11-20 DIAGNOSIS — M9903 Segmental and somatic dysfunction of lumbar region: Secondary | ICD-10-CM | POA: Diagnosis not present

## 2017-11-20 DIAGNOSIS — M545 Low back pain: Secondary | ICD-10-CM | POA: Diagnosis not present

## 2017-11-20 DIAGNOSIS — M9902 Segmental and somatic dysfunction of thoracic region: Secondary | ICD-10-CM | POA: Diagnosis not present

## 2017-11-20 DIAGNOSIS — M9905 Segmental and somatic dysfunction of pelvic region: Secondary | ICD-10-CM | POA: Diagnosis not present

## 2017-11-28 MED FILL — NP THYROID 60 MG TABLET: 60 | 30 days supply | Qty: 30 | Fill #1

## 2017-11-28 MED FILL — JULEBER 0.15-30 MG-MCG TABS: 0.15-30 | 84 days supply | Qty: 84 | Fill #0

## 2017-12-19 DIAGNOSIS — M9903 Segmental and somatic dysfunction of lumbar region: Secondary | ICD-10-CM | POA: Diagnosis not present

## 2017-12-19 DIAGNOSIS — M9905 Segmental and somatic dysfunction of pelvic region: Secondary | ICD-10-CM | POA: Diagnosis not present

## 2017-12-19 DIAGNOSIS — M9902 Segmental and somatic dysfunction of thoracic region: Secondary | ICD-10-CM | POA: Diagnosis not present

## 2017-12-19 DIAGNOSIS — M545 Low back pain: Secondary | ICD-10-CM | POA: Diagnosis not present

## 2017-12-21 DIAGNOSIS — K219 Gastro-esophageal reflux disease without esophagitis: Secondary | ICD-10-CM | POA: Diagnosis not present

## 2017-12-21 DIAGNOSIS — R590 Localized enlarged lymph nodes: Secondary | ICD-10-CM | POA: Diagnosis not present

## 2018-01-18 MED FILL — NP THYROID 60 MG TABLET: 60 | 30 days supply | Qty: 30 | Fill #2

## 2018-03-20 MED FILL — ENSKYCE 0.15-30 MG-MCG TABS: 0.15-30 | 84 days supply | Qty: 84 | Fill #1

## 2018-03-20 MED FILL — NP THYROID 60 MG TABLET: 60 | 30 days supply | Qty: 30 | Fill #3

## 2018-05-02 ENCOUNTER — Encounter: Payer: Self-pay | Admitting: *Deleted

## 2018-05-03 ENCOUNTER — Other Ambulatory Visit: Payer: Self-pay

## 2018-05-03 ENCOUNTER — Ambulatory Visit (INDEPENDENT_AMBULATORY_CARE_PROVIDER_SITE_OTHER): Payer: 59 | Admitting: Adult Health

## 2018-05-03 ENCOUNTER — Encounter: Payer: Self-pay | Admitting: Adult Health

## 2018-05-03 DIAGNOSIS — Z3201 Encounter for pregnancy test, result positive: Secondary | ICD-10-CM | POA: Diagnosis not present

## 2018-05-03 DIAGNOSIS — Z3A01 Less than 8 weeks gestation of pregnancy: Secondary | ICD-10-CM | POA: Insufficient documentation

## 2018-05-03 DIAGNOSIS — O3680X0 Pregnancy with inconclusive fetal viability, not applicable or unspecified: Secondary | ICD-10-CM

## 2018-05-03 NOTE — Progress Notes (Signed)
Patient ID: Cheyenne Moore, female   DOB: 23-Jul-1990, 28 y.o.   MRN: [id number]   TELEHEALTH VIRTUAL GYNECOLOGY VISIT ENCOUNTER NOTE  I connected with Cheyenne Moore on 05/03/18 at 10:00 AM EDT by telephone at home and verified that I am speaking with the correct person using two identifiers.   I discussed the limitations, risks, security and privacy concerns of performing an evaluation and management service by telephone and the availability of in person appointments. I also discussed with the patient that there may be a patient responsible charge related to this service. The patient expressed understanding and agreed to proceed.   History:  Cheyenne Moore is a 28 y.o. G1P0000 female,married being evaluated today for having missed a period and had 5+HPTs, so she is about 5+5 weeks by LMP with EDD 01/04/2019. She is a Psychologist, sport and exercise at [employer]. She denies any abnormal vaginal discharge, bleeding, nausea or other concerns.  She has had some cramping.      Past Medical History:  Diagnosis Date  . Allergic rhinitis   . GERD (gastroesophageal reflux disease)   . Thyroid disease    hypothyroid   History reviewed. No pertinent surgical history. The following portions of the patient's history were reviewed and updated as appropriate: allergies, current medications, past family history, past medical history, past social history, past surgical history and problem list.   Health Maintenance:  Normal pap and negative HRHPV on 10/31/17.  Review of Systems:  Pertinent items noted in HPI and remainder of comprehensive ROS otherwise negative.  Physical Exam:   General:  Alert, oriented and cooperative.   Mental Status: Normal mood and affect perceived. Normal judgment and thought content.  Physical exam deferred due to nature of the encounter Fall risk is low. PHQ 2 score 0.   Labs and Imaging No results found for this or any previous visit (from the past 336 hour(s)). No results found.     Assessment and Plan:     1. Pregnancy test positive -continue OTC PNV  2. Less than [redacted] weeks gestation of pregnancy -stay hydrated   3. Encounter to determine fetal viability of pregnancy, single or unspecified fetus - US OB Comp Less 14 Wks; Future       I discussed the assessment and treatment plan with the patient. The patient was provided an opportunity to ask questions and all were answered. The patient agreed with the plan and demonstrated an understanding of the instructions.   The patient was advised to call back or seek an in-person evaluation/go to the ED if the symptoms worsen or if the condition fails to improve as anticipated.  I provided 10 minutes of non-face-to-face time during this encounter.   Cyril Mourning, NP [facility] [employer], [facility]

## 2018-05-15 DIAGNOSIS — E559 Vitamin D deficiency, unspecified: Secondary | ICD-10-CM | POA: Diagnosis not present

## 2018-05-15 DIAGNOSIS — R5383 Other fatigue: Secondary | ICD-10-CM | POA: Diagnosis not present

## 2018-05-24 ENCOUNTER — Other Ambulatory Visit: Payer: Self-pay

## 2018-05-24 ENCOUNTER — Other Ambulatory Visit: Payer: Self-pay | Admitting: Adult Health

## 2018-05-24 ENCOUNTER — Ambulatory Visit (INDEPENDENT_AMBULATORY_CARE_PROVIDER_SITE_OTHER): Payer: 59

## 2018-05-24 DIAGNOSIS — Z3A01 Less than 8 weeks gestation of pregnancy: Secondary | ICD-10-CM

## 2018-05-24 DIAGNOSIS — O3680X0 Pregnancy with inconclusive fetal viability, not applicable or unspecified: Secondary | ICD-10-CM

## 2018-05-24 NOTE — Progress Notes (Signed)
[place] 7+6 wks,single IUP w/ys,fhr 142 bpm,normal ovaries bilat,crl 10.7 mm

## 2018-06-12 MED FILL — NP THYROID 60 MG TABLET: 60 | 30 days supply | Qty: 30 | Fill #0

## 2018-06-29 ENCOUNTER — Ambulatory Visit (INDEPENDENT_AMBULATORY_CARE_PROVIDER_SITE_OTHER): Payer: 59 | Admitting: Obstetrics & Gynecology

## 2018-06-29 ENCOUNTER — Ambulatory Visit: Payer: 59 | Admitting: *Deleted

## 2018-06-29 ENCOUNTER — Other Ambulatory Visit (INDEPENDENT_AMBULATORY_CARE_PROVIDER_SITE_OTHER): Payer: 59

## 2018-06-29 ENCOUNTER — Telehealth: Payer: Self-pay | Admitting: *Deleted

## 2018-06-29 ENCOUNTER — Other Ambulatory Visit: Payer: Self-pay | Admitting: Obstetrics & Gynecology

## 2018-06-29 ENCOUNTER — Other Ambulatory Visit: Payer: Self-pay

## 2018-06-29 DIAGNOSIS — O021 Missed abortion: Secondary | ICD-10-CM

## 2018-06-29 DIAGNOSIS — O3680X0 Pregnancy with inconclusive fetal viability, not applicable or unspecified: Secondary | ICD-10-CM

## 2018-06-29 DIAGNOSIS — Z3A09 9 weeks gestation of pregnancy: Secondary | ICD-10-CM | POA: Diagnosis not present

## 2018-06-29 MED ORDER — MISOPROSTOL 200 MCG PO TABS: ORAL_TABLET | ORAL | 1 refills | Status: DC

## 2018-06-29 MED ORDER — HYDROCODONE-ACETAMINOPHEN 5-325 MG PO TABS: 1.0000 | ORAL_TABLET | Freq: Four times a day (QID) | ORAL | 0 refills | Status: DC | PRN

## 2018-06-29 NOTE — Progress Notes (Signed)
Follow up appointment for results  No chief complaint on file.   Last menstrual period 03/30/2018.   FOLLOW UP SONOGRAM   Cheyenne Moore is in the office for a follow up sonogram for viability.  She is a 28 y.o. year old G1P0000 with Estimated Date of Delivery: 01/04/19 by LMP now at  [redacted]w[redacted]d weeks gestation. Thus far the pregnancy has been complicated by spotting.  GESTATION: SINGLETON   FETAL ACTIVITY:          Heart rate         No fht          The fetus is inactive.  AMNIOTIC FLUID: The amniotic fluid volume is  normal   CERVIX: Appears closed   ADNEXA: The ovaries are normal.   GESTATIONAL AGE AND  BIOMETRICS:  Gestational criteria: Estimated Date of Delivery: 01/04/19 by LMP now at [redacted]w[redacted]d  Previous Scans:1  GESTATIONAL SAC           53.6 mm         11+1 weeks  CROWN RUMP LENGTH           28.26 mm         9+4 weeks                                                                               AVERAGE EGA(BY THIS SCAN):  9+4 weeks                                               SUSPECTED ABNORMALITIES:  yes,no fht   QUALITY OF SCAN: satisfactory  TECHNICIAN COMMENTS:  US TV: 9+4 wks IUP,no fht,crl 28.26 mm,GS 53.6 mm,normal ovaries bilat,results discussed w/Dr.Eure    Chaperone: Amanda    A copy of this report including all images has been saved and backed up to a second source for retrieval if needed. All measures and details of the anatomical scan, placentation, fluid volume and pelvic anatomy are contained in that report.  Amber Heide Guile 06/29/2018 12:14 PM  Clinical Impression and recommendations:  I have reviewed the sonogram results above, combined with the patient's current clinical course, below are my impressions and any appropriate recommendations for management based on the sonographic findings.  Missed Ab in the first trimester, [redacted]w[redacted]d size G1P0000 Estimated Date of Delivery:   Normal general sonographic findings  Recommend routine care unless otherwise clinically indicated  [place] ordered this encounter: Meds ordered this encounter  Medications  . DISCONTD: misoprostol (CYTOTEC) 200 MCG tablet    Sig: Take 4 tablets at once orally or bucally    Dispense:  4 tablet    Refill:  1  . HYDROcodone-acetaminophen (NORCO/VICODIN) 5-325 MG tablet    Sig: Take 1 tablet by mouth every 6 (six) hours as needed.    Dispense:  10 tablet    Refill:  0    Orders for this encounter: No orders of the defined types were placed in this encounter.   Impression: 1st trimester missed AB  Plan: Conservative management  vs cytotec, will try to avoid D&C  Follow Up: No follow-ups on file.       Face to face time:  15 minutes  Greater than 50% of the visit time was spent in counseling and coordination of care with the patient.  The summary and outline of the counseling and care coordination is summarized in the note above.   All questions were answered.  Past Medical History:  Diagnosis Date  . Allergic rhinitis   . GERD (gastroesophageal reflux disease)   . Thyroid disease    hypothyroid    No past surgical history on file.  OB History    Gravida  1   Para  0   Term  0   Preterm  0   AB  0   Living  0     SAB  0   TAB  0   Ectopic  0   Multiple  0   Live Births  0           Allergies  Allergen Reactions  . Red Dye Other (See Comments)    GI upset    Social History   Socioeconomic History  . Marital status: Married    Spouse name: Not on file  . Number of children: Not on file  . Years of education: Not on file  . Highest education level: Not on file  Occupational History  . Occupation: Curatornurse tech    Employer: [employer]  Social Needs  . Financial resource strain: Not on file  . Food insecurity    Worry: Not on file    Inability: Not on file  . Transportation needs    Medical: Not on file     Non-medical: Not on file  Tobacco Use  . Smoking status: Never Smoker  . Smokeless tobacco: Never Used  Substance and Sexual Activity  . Alcohol use: No  . Drug use: No  . Sexual activity: Yes    Birth control/protection: None, Condom  Lifestyle  . Physical activity    Days per week: Not on file    Minutes per session: Not on file  . Stress: Not on file  Relationships  . Social Musicianconnections    Talks on phone: Not on file    Gets together: Not on file    Attends religious service: Not on file    Active member of club or organization: Not on file    Attends meetings of clubs or organizations: Not on file    Relationship status: Not on file  Other Topics Concern  . Not on file  Social History Narrative  . Not on file    Family History  Problem Relation Age of Onset  . Diabetes Father   . Hypertension Father   . Heart failure Father   . Diabetes Mother   . Hypertension Mother   . Kidney failure Mother   . Cancer Paternal Grandfather        lung  . Cancer Maternal Grandmother        kidney

## 2018-06-29 NOTE — Telephone Encounter (Signed)
Patient called stating she has been having some spotting for the last couple of days.  Last intercourse was last Thursday and has been spotting since.  Cramping is intermittent and mild.  U/S was normal.  Advised spotting can occur in early pregnancy which usually resolves with no problems.  Offered for patient to come for fetal heart tones, pt agreeable.  Will come in a little while.

## 2018-06-29 NOTE — Progress Notes (Signed)
US TV: 9+4 wks IUP,no fht,crl 28.26 mm,GS 53.6 mm,normal ovaries bilat,results discussed w/Dr.Eure

## 2018-07-01 ENCOUNTER — Emergency Department (HOSPITAL_COMMUNITY)
Admission: EM | Admit: 2018-07-01 | Discharge: 2018-07-01 | Disposition: A | Discharge status: Home / Self Care | Payer: 59 | Attending: Emergency Medicine | Admitting: Emergency Medicine

## 2018-07-01 ENCOUNTER — Other Ambulatory Visit: Payer: Self-pay

## 2018-07-01 DIAGNOSIS — N939 Abnormal uterine and vaginal bleeding, unspecified: Secondary | ICD-10-CM | POA: Diagnosis present

## 2018-07-01 DIAGNOSIS — E039 Hypothyroidism, unspecified: Secondary | ICD-10-CM | POA: Insufficient documentation

## 2018-07-01 DIAGNOSIS — R102 Pelvic and perineal pain: Secondary | ICD-10-CM | POA: Insufficient documentation

## 2018-07-01 DIAGNOSIS — O039 Complete or unspecified spontaneous abortion without complication: Secondary | ICD-10-CM | POA: Diagnosis not present

## 2018-07-01 LAB — BASIC METABOLIC PANEL
Anion gap: 8 (ref 5–15)
BUN: 10 mg/dL (ref 6–20)
CO2: 20 mmol/L — ABNORMAL LOW (ref 22–32)
Calcium: 9.6 mg/dL (ref 8.9–10.3)
Chloride: 107 mmol/L (ref 98–111)
Creatinine, Ser: 0.48 mg/dL (ref 0.44–1.00)
GFR calc Af Amer: >60 mL/min (ref >60)
GFR calc non Af Amer: >60 mL/min (ref >60)
Glucose, Bld: 105 mg/dL — ABNORMAL HIGH (ref 70–99)
Potassium: 3.7 mmol/L (ref 3.5–5.1)
Sodium: 135 mmol/L (ref 135–145)

## 2018-07-01 LAB — CBC WITH DIFFERENTIAL/PLATELET
Abs Immature Granulocytes: 0.03 10*3/uL (ref 0.00–0.07)
Basophils Absolute: 0 10*3/uL (ref 0.0–0.1)
Basophils Relative: 0 %
Eosinophils Absolute: 0.1 10*3/uL (ref 0.0–0.5)
Eosinophils Relative: 1 %
HCT: 36.8 % (ref 36.0–46.0)
Hemoglobin: 12.4 g/dL (ref 12.0–15.0)
Immature Granulocytes: 0 %
Lymphocytes Relative: 21 %
Lymphs Abs: 2.1 10*3/uL (ref 0.7–4.0)
MCH: 29.2 pg (ref 26.0–34.0)
MCHC: 33.7 g/dL (ref 30.0–36.0)
MCV: 86.8 fL (ref 80.0–100.0)
Monocytes Absolute: 0.6 10*3/uL (ref 0.1–1.0)
Monocytes Relative: 6 %
Neutro Abs: 7.2 10*3/uL (ref 1.7–7.7)
Neutrophils Relative %: 72 %
Platelets: 293 10*3/uL (ref 150–400)
RBC: 4.24 MIL/uL (ref 3.87–5.11)
RDW: 12.8 % (ref 11.5–15.5)
WBC: 10.2 10*3/uL (ref 4.0–10.5)
nRBC: 0 % (ref 0.0–0.2)

## 2018-07-01 LAB — HCG, QUANTITATIVE, PREGNANCY: hCG, Beta Chain, Quant, S: 2477 m[IU]/mL — ABNORMAL HIGH (ref <5)

## 2018-07-01 LAB — ABO/RH: ABO/RH(D): O POS

## 2018-07-01 MED ORDER — MISOPROSTOL 200 MCG PO TABS: 200.0000 ug | ORAL_TABLET | Freq: Three times a day (TID) | ORAL | 0 refills | Status: DC

## 2018-07-01 NOTE — ED Provider Notes (Signed)
[facility] EMERGENCY DEPARTMENT Provider Note   CSN: [id number] Arrival date & time: 07/01/18  2027     History   Chief Complaint Chief Complaint  Patient presents with  . Vaginal Bleeding    HPI Cheyenne Moore is a 28 y.o. female.     HPI   Cheyenne Moore is a 28 y.o. female who presents to the Emergency Department complaining of lower abdominal cramping and intermittent heavy vaginal bleeding.  She was evaluated by Dr. Elonda Husky on Friday and determined to have a non viable pregnancy. She was given Cytotec, but states she did not take it.  This morning she developed lower abdominal cramping and states she passed what she felt was the fetus.  She reports intermittent episodes today of severe cramping and heavy vaginal bleeding.  Symptoms improved this afternoon, but she woke from a nap around 6 pm and symptoms returned.  She reports the bleeding was heavy and she was changing her sanitary pad every hour.  She contacted the nurse line and was advised to come here for evaluation.  At present, she states the cramping and bleeding have greatly subsided.  She denies dizziness, vomiting, dysuria and fever  Past Medical History:  Diagnosis Date  . Allergic rhinitis   . GERD (gastroesophageal reflux disease)   . Thyroid disease    hypothyroid    Patient Active Problem List   Diagnosis Date Noted  . Encounter to determine fetal viability of pregnancy 05/03/2018  . Less than [redacted] weeks gestation of pregnancy 05/03/2018  . Pregnancy test positive 05/03/2018    No past surgical history on file.   OB History    Gravida  1   Para  0   Term  0   Preterm  0   AB  0   Living  0     SAB  0   TAB  0   Ectopic  0   Multiple  0   Live Births  0            Home Medications    Prior to Admission medications   Medication Sig Start Date End Date Taking? Authorizing Provider  cholecalciferol (VITAMIN D) 1000 units tablet Take 5,000 Units by mouth daily.     [provider]  HYDROcodone-acetaminophen (NORCO/VICODIN) 5-325 MG tablet Take 1 tablet by mouth every 6 (six) hours as needed. 06/29/18   Florian Buff, MD  misoprostol (CYTOTEC) 200 MCG tablet Take 4 tablets at once orally or bucally 06/29/18   Florian Buff, MD  NP THYROID 60 MG tablet Take 60 mg by mouth daily. 10/18/17   [provider]    Family History Family History  Problem Relation Age of Onset  . Diabetes Father   . Hypertension Father   . Heart failure Father   . Diabetes Mother   . Hypertension Mother   . Kidney failure Mother   . Cancer Paternal Grandfather        lung  . Cancer Maternal Grandmother        kidney    Social History Social History   Tobacco Use  . Smoking status: Never Smoker  . Smokeless tobacco: Never Used  Substance Use Topics  . Alcohol use: No  . Drug use: No     Allergies   Red dye   Review of Systems Review of Systems  Constitutional: Negative for activity change, appetite change, chills and fever.  Respiratory: Negative for chest tightness and  shortness of breath.   Cardiovascular: Negative for chest pain.  Gastrointestinal: Negative for abdominal pain, nausea and vomiting.  Genitourinary: Positive for pelvic pain and vaginal bleeding. Negative for decreased urine volume, dysuria, flank pain, frequency and hematuria.  Musculoskeletal: Negative for back pain.  Skin: Negative for rash.  Neurological: Negative for dizziness, weakness and numbness.  Hematological: Negative for adenopathy.  Psychiatric/Behavioral: Negative for confusion.     Physical Exam Updated Vital Signs BP 136/89 (BP Location: Right Arm)   Pulse 88   Temp 98.1 F (36.7 C) (Oral)   Resp 16   Ht 5\' 6"  (1.676 m)   LMP 03/30/2018   SpO2 100%   BMI 43.90 kg/m   Physical Exam Vitals signs and nursing note reviewed.  Constitutional:      Appearance: Normal appearance. She is not ill-appearing or toxic-appearing.  HENT:     Mouth/Throat:      Mouth: Mucous membranes are moist.  Cardiovascular:     Rate and Rhythm: Normal rate and regular rhythm.  Pulmonary:     Effort: Pulmonary effort is normal.  Chest:     Chest wall: No tenderness.  Abdominal:     General: There is no distension.     Palpations: Abdomen is soft.     Tenderness: There is no abdominal tenderness. There is no guarding.  Musculoskeletal: Normal range of motion.     Right lower leg: No edema.     Left lower leg: No edema.  Skin:    General: Skin is warm.     Findings: No rash.  Neurological:     General: No focal deficit present.     Mental Status: She is alert.     Sensory: Sensation is intact. No sensory deficit.     Motor: Motor function is intact. No weakness.     Comments: CN II-XII grossly intact      ED Treatments / Results  Labs (all labs ordered are listed, but only abnormal results are displayed) Labs Reviewed  BASIC METABOLIC PANEL - Abnormal; Notable for the following components:      Result Value   CO2 20 (*)    Glucose, Bld 105 (*)    All other components within normal limits  CBC WITH DIFFERENTIAL/PLATELET  HCG, QUANTITATIVE, PREGNANCY  ABO/RH    EKG    Radiology No results found.  Procedures Procedures (including critical care time)  Medications Ordered in ED Medications - No data to display   Initial Impression / Assessment and Plan / ED Course  I have reviewed the triage vital signs and the nursing notes.  Pertinent labs & imaging results that were available during my care of the patient were reviewed by me and considered in my medical decision making (see chart for details).       pt seen by Dr. Despina HiddenEure on Friday for miscarriage.  Woke this morning with abdominal cramping and heavy vaginal bleeding.  Passed products of conception this morning per patient.  Woke from a nap this evening with recurrence of bleeding and cramping prompting her to ER for evaluation.  At this time pain and bleeding have subsided. Hgb and  Hct are reassuring.    2230  Consulted Dr. Despina HiddenEure and discussed findings.  He recommends Cytotec 200 mcg TID x 3 days.  Have pt f/u in his office in 2 weeks.      Final Clinical Impressions(s) / ED Diagnoses   Final diagnoses:  Miscarriage    ED Discharge Orders  None       Pauline Ausriplett, Cheyenne Moore, Cordelia Poche-C 07/01/18 2320    Raeford RazorKohut, Stephen, MD 07/02/18 1615

## 2018-07-01 NOTE — Discharge Instructions (Addendum)
Take the medication as directed.  Call Dr. Brynda Greathouse office to arrange a follow-up appt in about 2 weeks.

## 2018-07-01 NOTE — ED Triage Notes (Addendum)
Pt with vaginal bleeding, pt states she had passed the baby earlier today. Pt states she was 13 weeks +cramping.  Spotting started last week. Had [place] 2 days ago and no heartbeat noted and baby had measured 9 weeks.  + clots per pt with continued bleeding and going through multiple pads;

## 2018-07-03 ENCOUNTER — Ambulatory Visit: Payer: 59 | Admitting: *Deleted

## 2018-07-03 ENCOUNTER — Other Ambulatory Visit: Payer: 59

## 2018-07-03 ENCOUNTER — Encounter: Payer: 59 | Admitting: Women's Health

## 2018-07-04 ENCOUNTER — Encounter: Payer: Self-pay | Admitting: *Deleted

## 2018-07-05 DIAGNOSIS — R030 Elevated blood-pressure reading, without diagnosis of hypertension: Secondary | ICD-10-CM | POA: Diagnosis not present

## 2018-07-05 DIAGNOSIS — E039 Hypothyroidism, unspecified: Secondary | ICD-10-CM | POA: Diagnosis not present

## 2018-07-05 DIAGNOSIS — E559 Vitamin D deficiency, unspecified: Secondary | ICD-10-CM | POA: Diagnosis not present

## 2018-07-10 DIAGNOSIS — Z029 Encounter for administrative examinations, unspecified: Secondary | ICD-10-CM

## 2018-07-13 ENCOUNTER — Ambulatory Visit: Payer: 59 | Admitting: Women's Health

## 2018-07-13 DIAGNOSIS — E039 Hypothyroidism, unspecified: Secondary | ICD-10-CM | POA: Diagnosis not present

## 2018-07-13 DIAGNOSIS — E559 Vitamin D deficiency, unspecified: Secondary | ICD-10-CM | POA: Diagnosis not present

## 2018-07-24 ENCOUNTER — Other Ambulatory Visit: Payer: Self-pay

## 2018-07-24 ENCOUNTER — Encounter: Payer: Self-pay | Admitting: Women's Health

## 2018-07-24 ENCOUNTER — Ambulatory Visit (INDEPENDENT_AMBULATORY_CARE_PROVIDER_SITE_OTHER): Payer: 59 | Admitting: Women's Health

## 2018-07-24 VITALS — BP 123/84 | HR 81 | Ht 66.0 in | Wt 284.0 lb

## 2018-07-24 DIAGNOSIS — O039 Complete or unspecified spontaneous abortion without complication: Secondary | ICD-10-CM | POA: Diagnosis not present

## 2018-07-24 DIAGNOSIS — Z3202 Encounter for pregnancy test, result negative: Secondary | ICD-10-CM | POA: Diagnosis not present

## 2018-07-24 LAB — POCT URINE PREGNANCY: Preg Test, Ur: NEGATIVE

## 2018-07-24 NOTE — Progress Notes (Signed)
   GYN VISIT Patient name: DERIONNA SALVADOR MRN [id number]  Date of birth: [birth year] Chief Complaint:   2 weeks follow-up (miscarriage)  History of Present Illness:   NAVEAH BRAVE is a 28 y.o. G75P0010 Caucasian female being seen today for f/u after recent completed SAB on 07/01/18. Bleeding/cramping has completely resolved. Taking pnv. Does want to try again for another pregnancy     Patient's last menstrual period was 03/30/2018. The current method of family planning is none. Last pap 10/31/17. Results were:  normal Review of Systems:   Pertinent items are noted in HPI Denies fever/chills, dizziness, headaches, visual disturbances, fatigue, shortness of breath, chest pain, abdominal pain, vomiting, abnormal vaginal discharge/itching/odor/irritation, problems with periods, bowel movements, urination, or intercourse unless otherwise stated above.  Pertinent History Reviewed:  Reviewed past medical,surgical, social, obstetrical and family history.  Reviewed problem list, medications and allergies. Physical Assessment:   Vitals:   07/24/18 1449  BP: 123/84  Pulse: 81  Weight: 284 lb (128.8 kg)  Height: 5\' 6"  (1.676 m)  Body mass index is 45.84 kg/m.       Physical Examination:   General appearance: alert, well appearing, and in no distress  Mental status: alert, oriented to person, place, and time  Skin: warm & dry   Cardiovascular: normal heart rate noted  Respiratory: normal respiratory effort, no distress  Abdomen: soft, non-tender   Pelvic: examination not indicated  Extremities: no edema   Results for orders placed or performed in visit on 07/24/18 (from the past 24 hour(s))  POCT urine pregnancy   Collection Time: 07/24/18  3:02 PM  Result Value Ref Range   Preg Test, Ur Negative Negative    Assessment & Plan:  1) Recent SAB> will check HCG today, if <5, ok to try to conceive, continue pnv daily, let us know as soon as gets +HPT  Meds: No orders of the defined  types were placed in this encounter.   Orders Placed This Encounter  Procedures  . Beta hCG quant (ref lab)  . POCT urine pregnancy    Return for as needed, after 10/15 for physical.  Roma Schanz CNM, [facility] 07/24/2018 3:21 PM

## 2018-07-25 LAB — BETA HCG QUANT (REF LAB): hCG Quant: 3 m[IU]/mL

## 2018-07-31 DIAGNOSIS — Z131 Encounter for screening for diabetes mellitus: Secondary | ICD-10-CM | POA: Diagnosis not present

## 2018-07-31 DIAGNOSIS — E559 Vitamin D deficiency, unspecified: Secondary | ICD-10-CM | POA: Diagnosis not present

## 2018-07-31 DIAGNOSIS — Z139 Encounter for screening, unspecified: Secondary | ICD-10-CM | POA: Diagnosis not present

## 2018-07-31 DIAGNOSIS — Z0000 Encounter for general adult medical examination without abnormal findings: Secondary | ICD-10-CM | POA: Diagnosis not present

## 2018-07-31 DIAGNOSIS — Z13220 Encounter for screening for lipoid disorders: Secondary | ICD-10-CM | POA: Diagnosis not present

## 2018-07-31 DIAGNOSIS — Z23 Encounter for immunization: Secondary | ICD-10-CM | POA: Diagnosis not present

## 2018-07-31 DIAGNOSIS — E039 Hypothyroidism, unspecified: Secondary | ICD-10-CM | POA: Diagnosis not present

## 2018-08-14 MED FILL — NP THYROID 90 MG TABLET: 90 | 30 days supply | Qty: 30 | Fill #0

## 2018-09-17 MED FILL — NP THYROID 90 MG TABLET: 90 | 30 days supply | Qty: 30 | Fill #1

## 2018-11-05 MED FILL — NP THYROID 90 MG TABLET: 90 | 30 days supply | Qty: 30 | Fill #0

## 2018-11-12 ENCOUNTER — Ambulatory Visit (INDEPENDENT_AMBULATORY_CARE_PROVIDER_SITE_OTHER): Payer: 59 | Admitting: Internal Medicine

## 2018-12-03 ENCOUNTER — Other Ambulatory Visit (INDEPENDENT_AMBULATORY_CARE_PROVIDER_SITE_OTHER): Payer: Self-pay | Admitting: Internal Medicine

## 2018-12-03 MED FILL — NP THYROID 90 MG TABLET: 90 | 30 days supply | Qty: 30 | Fill #0

## 2018-12-22 DIAGNOSIS — H52223 Regular astigmatism, bilateral: Secondary | ICD-10-CM | POA: Diagnosis not present

## 2019-01-18 NOTE — L&D Delivery Note (Addendum)
OB/GYN Faculty Practice Delivery Note  Cheyenne Moore is a 29 y.o. G2P0010 s/p VD after IOL for A2GDM at [redacted]w[redacted]d.  ROM: 17h 15m with clear fluid GBS Status: Positive/-- (09/16 1405) (adequate abx prophylaxis) Maximum Maternal Temperature: 100.9 F  Labor Progress: Patient presented to L&D for IOL on 10/17/19. Augmented with FB, cytotec, pitocin. Labor course was protracted, but FHT was reassuring throughout. She progressed to complete.   Delivery Date/Time: 10/21/19 @ 03:26 Delivery: Called to room and patient was complete and pushing. Head position was LOA and delivered with ease over the perineum. No nuchal cord present. Shoulder and body delivered in usual fashion. Infant with spontaneous cry, placed on mother's abdomen, dried and stimulated. Cord clamped x 2 after 1-minute delay, and cut by FOB. Cord blood drawn. Placenta delivered spontaneously with gentle cord traction. Fundus firm with massage and pitocin started. Labia, perineum, vagina, and cervix inspected and significant for 2nd degree perineal tear.  Baby Weight: per chart  Cord: central insertion, 3 vessel Placenta: Sent to L&D, intact Complications: None Lacerations: 2nd degree perineal tear, and was repaired in the standard fashion EBL: 400 cc Analgesia: Epidural   Infant: APGAR (1 MIN): 9 APGAR (5 MINS): 9  Herby Abraham MD, PGY-1 OBGYN Faculty Teaching Service  10/21/2019, 4:21 AM   I was gloved and present for delivery in its entirety. At delivery, a maternal temp was identified and ampicillin and gentamycin were ordered. Prophylactic TXA was given approximately 20 minutes prior to delivery due to long induction of labor and risk factors for PPH.  Rolm Bookbinder, CNM 4:33 AM

## 2019-01-28 ENCOUNTER — Other Ambulatory Visit (INDEPENDENT_AMBULATORY_CARE_PROVIDER_SITE_OTHER): Payer: Self-pay | Admitting: Internal Medicine

## 2019-01-30 ENCOUNTER — Other Ambulatory Visit (INDEPENDENT_AMBULATORY_CARE_PROVIDER_SITE_OTHER): Payer: Self-pay

## 2019-02-07 ENCOUNTER — Encounter (INDEPENDENT_AMBULATORY_CARE_PROVIDER_SITE_OTHER): Payer: Self-pay | Admitting: Nurse Practitioner

## 2019-02-07 ENCOUNTER — Other Ambulatory Visit: Payer: Self-pay

## 2019-02-07 ENCOUNTER — Ambulatory Visit (INDEPENDENT_AMBULATORY_CARE_PROVIDER_SITE_OTHER): Payer: 59 | Admitting: Nurse Practitioner

## 2019-02-07 VITALS — BP 134/87 | HR 67 | Temp 97.4°F | Resp 14 | Ht 67.0 in | Wt 296.0 lb

## 2019-02-07 DIAGNOSIS — M542 Cervicalgia: Secondary | ICD-10-CM | POA: Insufficient documentation

## 2019-02-07 DIAGNOSIS — E559 Vitamin D deficiency, unspecified: Secondary | ICD-10-CM | POA: Diagnosis not present

## 2019-02-07 DIAGNOSIS — E039 Hypothyroidism, unspecified: Secondary | ICD-10-CM | POA: Insufficient documentation

## 2019-02-07 DIAGNOSIS — Z6841 Body Mass Index (BMI) 40.0 and over, adult: Secondary | ICD-10-CM

## 2019-02-07 DIAGNOSIS — E669 Obesity, unspecified: Secondary | ICD-10-CM | POA: Insufficient documentation

## 2019-02-07 HISTORY — DX: Cervicalgia: M54.2

## 2019-02-07 MED ORDER — THYROID 90 MG PO TABS: 90.0000 mg | ORAL_TABLET | Freq: Every day | ORAL | 0 refills | Status: DC

## 2019-02-07 NOTE — Patient Instructions (Signed)
Thank you for choosing [facility] as your medical provider! If you have any questions or concerns regarding your health care, please do not hesitate to call our office.  Continue medications as prescribed.  Watch FORKS OVER KNIVES  Please follow-up as scheduled in 4 weeks. We look forward to seeing you again soon!   At [facility] we value your feedback. You may receive a survey about your visit today. Please share your experience as we strive to create trusting relationships with our patients to provide genuine, compassionate, quality care.  We appreciate your understanding and patience as we review any laboratory studies, imaging, and other diagnostic tests that are ordered as we care for you. We do our best to address any and all results in a timely manner. If you do not hear about test results within 1 week, please do not hesitate to contact us. If we referred you to a specialist during your visit or ordered imaging testing, contact the office if you have not been contacted to be scheduled within 1 weeks.  We also encourage the use of MyChart, which contains your medical information for your review as well. If you are not enrolled in this feature, an access code is on this after visit summary for your convenience. Thank you for allowing [place] to be involved in your care.     [place] Optimal [facility] Dietary Recommendations for Weight Loss What to Avoid . Avoid added sugars o Often added sugar can be found in processed foods such as many condiments, dry cereals, cakes, cookies, chips, crisps, crackers, candies, sweetened drinks, etc.  o Read labels and AVOID/DECREASE use of foods with the following in their ingredient list: Sugar, fructose, high fructose corn syrup, sucrose, glucose, maltose, dextrose, molasses, cane sugar, brown sugar, any type of syrup, agave nectar, etc.   . Avoid snacking in between meals . Avoid foods made with flour o If you are going to eat food  made with flour, choose those made with whole-grains; and, minimize your consumption as much as is tolerable . Avoid processed foods o These foods are generally stocked in the middle of the grocery store. Focus on shopping on the perimeter of the grocery.  . Avoid Meat  o We recommend following a plant-based diet at [facility]. Thus, we recommend avoiding meat as a general rule. Consider eating beans, legumes, eggs, and/or dairy products for regular protein sources o If you plan on eating meat limit to 4 ounces of meat at a time and choose lean options such as Fish, chicken, [place]. Avoid red meat intake such as pork and/or steak What to Include . Vegetables o GREEN LEAFY VEGETABLES: Kale, spinach, mustard greens, collard greens, cabbage, broccoli, etc. o OTHER: Asparagus, cauliflower, eggplant, carrots, peas, Brussel sprouts, tomatoes, bell peppers, zucchini, beets, cucumbers, etc. . Grains, seeds, and legumes o Beans: kidney beans, black eyed peas, garbanzo beans, black beans, pinto beans, etc. o Whole, unrefined grains: brown rice, barley, bulgur, oatmeal, etc. . Healthy fats  o Avoid highly processed fats such as vegetable oil o Examples of healthy fats: avocado, olives, virgin olive oil, dark chocolate (?72% Cocoa), nuts (peanuts, almonds, walnuts, cashews, pecans, etc.) . None to Low Intake of Animal Sources of Protein o Meat sources: chicken, [place], salmon, tuna. Limit to 4 ounces of meat at one time. o Consider limiting dairy sources, but when choosing dairy focus on: PLAIN [place] yogurt, cottage cheese, high-protein milk . Fruit o Choose berries  When to Eat .  Intermittent Fasting: o Choosing not to eat for a specific time period, but DO FOCUS ON HYDRATION when fasting o Multiple Techniques: - Time Restricted Eating: eat 3 meals in a day, each meal lasting no more than 60 minutes, no snacks between meals - 16-18 hour fast: fast for 16 to 18 hours up to 7 days a week.  Often suggested to start with 2-3 nonconsecutive days per week.  . Remember the time you sleep is counted as fasting.  . Examples of eating schedule: Fast from 7:00pm-11:00am. Eat between 11:00am-7:00pm.  - 24-hour fast: fast for 24 hours up to every other day. Often suggested to start with 1 day per week . Remember the time you sleep is counted as fasting . Examples of eating schedule:  o Eating day: eat 2-3 meals on your eating day. If doing 2 meals, each meal should last no more than 90 minutes. If doing 3 meals, each meal should last no more than 60 minutes. Finish last meal by 7:00pm. o Fasting day: Fast until 7:00pm.  o IF YOU FEEL UNWELL FOR ANY REASON/IN ANY WAY WHEN FASTING, STOP FASTING BY EATING A NUTRITIOUS SNACK OR LIGHT MEAL o ALWAYS FOCUS ON HYDRATION DURING FASTS - Acceptable Hydration sources: water, broths, tea/coffee (black tea/coffee is best but using a small amount of whole-fat dairy products in coffee/tea is acceptable).  - Poor Hydration Sources: anything with sugar or artificial sweeteners added to it  These recommendations have been developed for patients that are actively receiving medical care from either Dr. Karilyn Cota or Jiles Prows, DNP, NP-C at [facility]. These recommendations are developed for patients with specific medical conditions and are not meant to be distributed or used by others that are not actively receiving care from either provider listed above at [facility]. It is not appropriate to participate in the above eating plans without proper medical supervision.   Reference: Lawrence Santiago. The obesity code. [place]; 2016.

## 2019-02-07 NOTE — Assessment & Plan Note (Signed)
I will refill her NP thyroid today.  She will follow-up in 4 weeks for blood work for further evaluation.

## 2019-02-07 NOTE — Progress Notes (Signed)
Subjective:  Patient ID: Cheyenne Moore, female    DOB: 02/19/1990  Age: 29 y.o. MRN: [id number]  CC:  Chief Complaint  Patient presents with  . Follow-up    Hypothyroidism, vitamin D deficiency, obesity      HPI  This patient arrives today for follow-up of chronic conditions as listed above.  Hypothyroidism: She is on NP thyroid 90 mg daily.  She tells me she has been out of her medication for the past few days.  Last thyroid panel was collected in July 2020 and was within normal limits.  She is requesting a refill today.  Vitamin D deficiency: She is on 10,000 IUs of vitamin D3 daily.  Last serum level was collected in July 2020 and it was 46.  Obesity: She does continue to have challenges regarding losing weight.  She often will participate in intermittent fasting especially days that she works.  She usually does not eat breakfast on those days.  She has gained a little bit more weight and this is bothersome to her.  Neck pain: She also mentions to me that she has had left-sided neck pain intermittently for the past 2 to 3 weeks.  She tells me certain movements will make it worse.  She denies any sensation changes or weakness changes to either arm.  She is planning on seeing a chiropractor within the next couple days.   Past Medical History:  Diagnosis Date  . Allergic rhinitis   . GERD (gastroesophageal reflux disease)   . Thyroid disease    hypothyroid      Family History  Problem Relation Age of Onset  . Diabetes Father   . Hypertension Father   . Heart failure Father   . Diabetes Mother   . Hypertension Mother   . Kidney failure Mother   . Cancer Paternal Grandfather        lung  . Cancer Maternal Grandmother        kidney    Social History   Social History Narrative  . Not on file   Social History   Tobacco Use  . Smoking status: Never Smoker  . Smokeless tobacco: Never Used  Substance Use Topics  . Alcohol use: No     Current Meds    Medication Sig  . cholecalciferol (VITAMIN D) 1000 units tablet Take 10,000 Units by mouth daily.   . Prenatal Vit-Fe Fumarate-FA (PRENATAL MULTIVITAMIN) TABS tablet Take 1 tablet by mouth daily at 12 noon.  . thyroid (NP THYROID) 90 MG tablet Take 1 tablet (90 mg total) by mouth daily.  . [DISCONTINUED] NP THYROID 90 MG tablet TAKE 1 TABLET BY MOUTH ONCE A DAY    ROS:  Review of Systems  Constitutional: Negative for fever and malaise/fatigue.  Eyes: Negative for blurred vision.  Respiratory: Negative for cough, shortness of breath and wheezing.   Cardiovascular: Negative for chest pain and palpitations.  Gastrointestinal: Negative for constipation and diarrhea.  Neurological: Negative for dizziness, sensory change and weakness.  Endo/Heme/Allergies:       (-) Hair loss     Objective:   Today's Vitals: BP 134/87   Pulse 67   Temp (!) 97.4 F (36.3 C)   Resp 14   Ht 5' 7"  (1.702 m)   Wt 296 lb (134.3 kg)   LMP 01/18/2019 (Exact Date)   SpO2 99%   BMI 46.36 kg/m  Vitals with BMI 02/07/2019 07/24/2018 07/01/2018  Height 5' 7"  5' 6"  -  Weight  296 lbs 284 lbs -  BMI 82.95 62.13 -  Systolic 086 578 469  Diastolic 87 84 66  Pulse 67 81 77     Physical Exam Vitals reviewed.  Constitutional:      General: She is not in acute distress.    Appearance: Normal appearance.  HENT:     Head: Normocephalic and atraumatic.  Neck:     Vascular: No carotid bruit.  Cardiovascular:     Rate and Rhythm: Normal rate and regular rhythm.     Pulses: Normal pulses.     Heart sounds: Normal heart sounds.  Pulmonary:     Effort: Pulmonary effort is normal.     Breath sounds: Normal breath sounds.  Skin:    General: Skin is warm and dry.  Neurological:     General: No focal deficit present.     Mental Status: She is alert and oriented to person, place, and time.  Psychiatric:        Mood and Affect: Mood normal.        Behavior: Behavior normal.        Judgment: Judgment normal.           Assessment   1. Hypothyroidism, unspecified type   2. Vitamin D deficiency       Tests ordered Orders Placed This Encounter  Procedures  . TSH  . T3, Free  . T4, Free  . Vitamin D, 25-hydroxy  . CMP with eGFR([facility])     Plan: Please see assessment and plan per problem list below.   Meds ordered this encounter  Medications  . thyroid (NP THYROID) 90 MG tablet    Sig: Take 1 tablet (90 mg total) by mouth daily.    Dispense:  90 tablet    Refill:  0    Order Specific Question:   Supervising Provider    Answer:   Doree Albee [[id number]]    Patient to follow-up in 3 months.  Ailene Ards, NP

## 2019-02-07 NOTE — Assessment & Plan Note (Signed)
She will continue taking her vitamin D supplement as directed.  We will collect a vitamin D serum level at her next visit for lab draw.

## 2019-02-07 NOTE — Assessment & Plan Note (Signed)
I encouraged her to continue fasting as she is able to.  I also gave her a handout with recommendations regarding which foods to eat which foods to avoid.  I encouraged her to try a plant-based diet.  I encouraged her to watch the documentary Forks over knives to help educate her on plant-based diet as well as to hopefully help motivate her.  She is encouraged to call us with any questions or concerns.  She will follow-up in approximately 3 months.

## 2019-02-07 NOTE — Assessment & Plan Note (Signed)
No worrisome red flag signs associated with her neck pain at this time.  She has seen a chiropractor before and found this helpful.  I encouraged her to follow-up with chiropractor as needed.  I told her that if she would like a mild muscle relaxer I can also prescribe this, but she would first prefer to see the chiropractor.
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
## 2019-02-14 MED FILL — NP THYROID 90 MG TABLET: 90 | 90 days supply | Qty: 90 | Fill #0

## 2019-02-15 DIAGNOSIS — M9901 Segmental and somatic dysfunction of cervical region: Secondary | ICD-10-CM | POA: Diagnosis not present

## 2019-02-15 DIAGNOSIS — M542 Cervicalgia: Secondary | ICD-10-CM | POA: Diagnosis not present

## 2019-02-19 ENCOUNTER — Encounter (INDEPENDENT_AMBULATORY_CARE_PROVIDER_SITE_OTHER): Payer: Self-pay | Admitting: Nurse Practitioner

## 2019-02-21 ENCOUNTER — Ambulatory Visit (INDEPENDENT_AMBULATORY_CARE_PROVIDER_SITE_OTHER): Payer: 59 | Admitting: *Deleted

## 2019-02-21 ENCOUNTER — Encounter: Payer: Self-pay | Admitting: *Deleted

## 2019-02-21 ENCOUNTER — Other Ambulatory Visit: Payer: Self-pay

## 2019-02-21 VITALS — BP 138/87 | HR 71 | Ht 66.0 in | Wt 295.0 lb

## 2019-02-21 DIAGNOSIS — Z3201 Encounter for pregnancy test, result positive: Secondary | ICD-10-CM | POA: Diagnosis not present

## 2019-02-21 LAB — POCT URINE PREGNANCY: Preg Test, Ur: POSITIVE — AB

## 2019-02-21 NOTE — Progress Notes (Signed)
Chart reviewed for nurse visit. Agree with plan of care.  Adline Potter, NP 02/21/2019 11:33 AM

## 2019-02-21 NOTE — Progress Notes (Signed)
   NURSE VISIT- PREGNANCY CONFIRMATION   SUBJECTIVE:  Cheyenne Moore is a 29 y.o. G2P0010 female at [redacted]w[redacted]d by certain LMP of Patient's last menstrual period was 01/18/2019. Here for pregnancy confirmation.  Home pregnancy test: positive x 4  She reports nausea.  She is taking prenatal vitamins.    OBJECTIVE:  BP 138/87 (BP Location: Left Arm, Patient Position: Sitting, Cuff Size: Large)   Pulse 71   Ht 5\' 6"  (1.676 m)   Wt 295 lb (133.8 kg)   LMP 01/18/2019   Breastfeeding No   BMI 47.61 kg/m   Appears well, in no apparent distress OB History  Gravida Para Term Preterm AB Living  2 0 0 0 1 0  SAB TAB Ectopic Multiple Live Births  1 0 0 0 0    # Outcome Date GA Lbr Len/2nd Weight Sex Delivery Anes PTL Lv  2 Current           1 SAB 07/01/18 [redacted]w[redacted]d           Results for orders placed or performed in visit on 02/21/19 (from the past 24 hour(s))  POCT urine pregnancy   Collection Time: 02/21/19 10:40 AM  Result Value Ref Range   Preg Test, Ur Positive (A) Negative    ASSESSMENT: Positive pregnancy test, [redacted]w[redacted]d by LMP    PLAN: Schedule for dating ultrasound after labs are back.  Prenatal vitamins: continue   Nausea medicines: not currently needed   OB packet given: Yes  [redacted]w[redacted]d  02/21/2019 10:49 AM

## 2019-02-22 LAB — PROGESTERONE: Progesterone: 14.6 ng/mL

## 2019-02-22 LAB — BETA HCG QUANT (REF LAB): hCG Quant: 972 m[IU]/mL

## 2019-02-25 ENCOUNTER — Encounter: Payer: Self-pay | Admitting: *Deleted

## 2019-02-25 ENCOUNTER — Telehealth (INDEPENDENT_AMBULATORY_CARE_PROVIDER_SITE_OTHER): Payer: Self-pay | Admitting: Nurse Practitioner

## 2019-02-25 ENCOUNTER — Encounter (INDEPENDENT_AMBULATORY_CARE_PROVIDER_SITE_OTHER): Payer: Self-pay | Admitting: Nurse Practitioner

## 2019-02-25 NOTE — Progress Notes (Signed)
This individual is a patient under my care, and recently found out that she was pregnant.  According to the [facility] pregnant individuals are considered to be at increased risk for severe illness or death related to the COVID-19 virus.  In addition contracting the COVID-19 virus while pregnant has been found to increased risks of adverse outcomes in pregnancy such as preterm delivery.  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  Especially if those patients are found to be or are presumed positive for COVID-19.  The patient tells me she has discussed this with her work, but they told her that she would need a note from her doctor stating that the recommended restrictions.  Note will be written and provided to patient.

## 2019-02-25 NOTE — Telephone Encounter (Signed)
Cheyenne Moore, please go under this patient's chart review and print out the letter that I wrote for the patient today (02/25/19).  This is a work note.  Please call this patient and let her know that she can pick this up at the office at her leisure.  As far as I know, the patient is not Covid positive nor is she having symptoms of Covid.  Thus, she should be able to physically come into the office to pick it up.  It may be prudent to screen her for any Covid symptoms prior to her coming into the office.  So consider asking her the normal screenings questions you ask any patient comes into the building prior to letting her come to pick up the letter.  She also may be able to access it via her MyChart but I did send the patient a message stating that I would have you print the letter out and call her once you can have it ready for her up front.  Thank you

## 2019-03-13 ENCOUNTER — Other Ambulatory Visit: Payer: Self-pay

## 2019-03-13 ENCOUNTER — Other Ambulatory Visit (INDEPENDENT_AMBULATORY_CARE_PROVIDER_SITE_OTHER): Payer: 59

## 2019-03-13 DIAGNOSIS — E559 Vitamin D deficiency, unspecified: Secondary | ICD-10-CM | POA: Diagnosis not present

## 2019-03-13 DIAGNOSIS — E039 Hypothyroidism, unspecified: Secondary | ICD-10-CM | POA: Diagnosis not present

## 2019-03-13 LAB — COMPLETE METABOLIC PANEL WITH GFR
AG Ratio: 1.8 (calc) (ref 1.0–2.5)
ALT: 21 U/L (ref 6–29)
AST: 20 U/L (ref 10–30)
Albumin: 4.2 g/dL (ref 3.6–5.1)
Alkaline phosphatase (APISO): 57 U/L (ref 31–125)
BUN: 11 mg/dL (ref 7–25)
CO2: 23 mmol/L (ref 20–32)
Calcium: 10 mg/dL (ref 8.6–10.2)
Chloride: 105 mmol/L (ref 98–110)
Creat: 0.61 mg/dL (ref 0.50–1.10)
GFR, Est African American: 145 mL/min/{1.73_m2} (ref >=60)
GFR, Est Non African American: 125 mL/min/{1.73_m2} (ref >=60)
Globulin: 2.4 g/dL (calc) (ref 1.9–3.7)
Glucose, Bld: 131 mg/dL (ref 65–139)
Potassium: 4.3 mmol/L (ref 3.5–5.3)
Sodium: 137 mmol/L (ref 135–146)
Total Bilirubin: 0.8 mg/dL (ref 0.2–1.2)
Total Protein: 6.6 g/dL (ref 6.1–8.1)

## 2019-03-13 LAB — TSH: TSH: 3.15 mIU/L

## 2019-03-13 LAB — T3, FREE: T3, Free: 3.9 pg/mL (ref 2.3–4.2)

## 2019-03-13 LAB — T4, FREE: Free T4: 0.7 ng/dL — ABNORMAL LOW (ref 0.8–1.8)

## 2019-03-13 LAB — VITAMIN D 25 HYDROXY (VIT D DEFICIENCY, FRACTURES): Vit D, 25-Hydroxy: 35 ng/mL (ref 30–100)

## 2019-03-14 ENCOUNTER — Ambulatory Visit (INDEPENDENT_AMBULATORY_CARE_PROVIDER_SITE_OTHER): Payer: 59

## 2019-03-14 ENCOUNTER — Other Ambulatory Visit: Payer: Self-pay

## 2019-03-14 ENCOUNTER — Other Ambulatory Visit: Payer: Self-pay | Admitting: Obstetrics and Gynecology

## 2019-03-14 ENCOUNTER — Other Ambulatory Visit: Payer: 59

## 2019-03-14 DIAGNOSIS — O3680X0 Pregnancy with inconclusive fetal viability, not applicable or unspecified: Secondary | ICD-10-CM

## 2019-03-14 DIAGNOSIS — Z3A01 Less than 8 weeks gestation of pregnancy: Secondary | ICD-10-CM

## 2019-03-14 NOTE — Progress Notes (Signed)
[place] TA/TV:7+6 wks,single IUP w/ys,positive fht 164 bpm,normal left ovary,hemorrhagic right corpus luteal cyst 1.8 x 1.7 x 1.6 cm

## 2019-03-19 ENCOUNTER — Other Ambulatory Visit (INDEPENDENT_AMBULATORY_CARE_PROVIDER_SITE_OTHER): Payer: Self-pay | Admitting: Nurse Practitioner

## 2019-03-19 DIAGNOSIS — E039 Hypothyroidism, unspecified: Secondary | ICD-10-CM

## 2019-03-26 ENCOUNTER — Telehealth: Payer: Self-pay | Admitting: *Deleted

## 2019-03-26 MED ORDER — BONJESTA 20-20 MG PO TBCR: 1.0000 | EXTENDED_RELEASE_TABLET | Freq: Every day | ORAL | 8 refills | Status: DC

## 2019-03-26 NOTE — Telephone Encounter (Signed)
Pt requesting rx for nausea

## 2019-04-10 ENCOUNTER — Other Ambulatory Visit: Payer: Self-pay | Admitting: Obstetrics and Gynecology

## 2019-04-10 DIAGNOSIS — Z3682 Encounter for antenatal screening for nuchal translucency: Secondary | ICD-10-CM

## 2019-04-11 ENCOUNTER — Ambulatory Visit: Payer: 59 | Admitting: *Deleted

## 2019-04-11 ENCOUNTER — Ambulatory Visit (INDEPENDENT_AMBULATORY_CARE_PROVIDER_SITE_OTHER): Payer: 59

## 2019-04-11 ENCOUNTER — Other Ambulatory Visit: Payer: Self-pay

## 2019-04-11 ENCOUNTER — Ambulatory Visit (INDEPENDENT_AMBULATORY_CARE_PROVIDER_SITE_OTHER): Payer: 59 | Admitting: Advanced Practice Midwife

## 2019-04-11 ENCOUNTER — Encounter: Payer: Self-pay | Admitting: Advanced Practice Midwife

## 2019-04-11 VITALS — BP 128/88 | HR 102 | Wt 297.0 lb

## 2019-04-11 DIAGNOSIS — Z3A12 12 weeks gestation of pregnancy: Secondary | ICD-10-CM

## 2019-04-11 DIAGNOSIS — Z3682 Encounter for antenatal screening for nuchal translucency: Secondary | ICD-10-CM | POA: Diagnosis not present

## 2019-04-11 DIAGNOSIS — E039 Hypothyroidism, unspecified: Secondary | ICD-10-CM

## 2019-04-11 DIAGNOSIS — Z3480 Encounter for supervision of other normal pregnancy, unspecified trimester: Secondary | ICD-10-CM

## 2019-04-11 DIAGNOSIS — Z3A11 11 weeks gestation of pregnancy: Secondary | ICD-10-CM | POA: Diagnosis not present

## 2019-04-11 DIAGNOSIS — O0990 Supervision of high risk pregnancy, unspecified, unspecified trimester: Secondary | ICD-10-CM | POA: Insufficient documentation

## 2019-04-11 LAB — POCT URINALYSIS DIPSTICK OB
Blood, UA: NEGATIVE
Glucose, UA: NEGATIVE
Ketones, UA: NEGATIVE
Leukocytes, UA: NEGATIVE
Nitrite, UA: NEGATIVE
POC,PROTEIN,UA: NEGATIVE

## 2019-04-11 MED ORDER — THYROID 120 MG PO TABS: 120.0000 mg | ORAL_TABLET | Freq: Every day | ORAL | 6 refills | Status: DC

## 2019-04-11 MED ORDER — ONDANSETRON 4 MG PO TBDP: 4.0000 mg | ORAL_TABLET | Freq: Four times a day (QID) | ORAL | 2 refills | Status: DC | PRN

## 2019-04-11 NOTE — Progress Notes (Signed)
INITIAL OBSTETRICAL VISIT Patient name: Cheyenne Moore MRN [id number]  Date of birth: Sep 12, 1990 Chief Complaint:   Initial Prenatal Visit  History of Present Illness:   Cheyenne Moore is a 29 y.o. G67P0010 Caucasian female at [redacted]w[redacted]d by LMP/7 week [place] with an Estimated Date of Delivery: 10/25/19 being seen today for her initial obstetrical visit.   Her obstetrical history is significant for SAB last year (measured 9+ weeks). .   Today she reports nausea is better, but still has it sometimes.  Never got bonjesta, will rx zofran prn.  Patient's last menstrual period was 01/18/2019. Last pap 10/31/17. Results were: normal Review of Systems:   Pertinent items are noted in HPI Denies cramping/contractions, leakage of fluid, vaginal bleeding, abnormal vaginal discharge w/ itching/odor/irritation, headaches, visual changes, shortness of breath, chest pain, abdominal pain, severe nausea/vomiting, or problems with urination or bowel movements unless otherwise stated above.  Pertinent History Reviewed:  Reviewed past medical,surgical, social, obstetrical and family history.  Reviewed problem list, medications and allergies. OB History  Gravida Para Term Preterm AB Living  2 0 0 0 1 0  SAB TAB Ectopic Multiple Live Births  1 0 0 0 0    # Outcome Date GA Lbr Len/2nd Weight Sex Delivery Anes PTL Lv  2 Current           1 SAB 07/01/18 [redacted]w[redacted]d            Birth Comments: 9.4 week sized fetus   Physical Assessment:   Vitals:   04/11/19 0907  BP: 128/88  Pulse: (!) 102  Weight: 297 lb (134.7 kg)  Body mass index is 47.94 kg/m.       Physical Examination:  General appearance - well appearing, and in no distress  Mental status - alert, oriented to person, place, and time  Psych:  She has a normal mood and affect  Skin - warm and dry, normal color, no suspicious lesions noted  Chest - effort normal, all lung fields clear to auscultation bilaterally  Heart - normal rate and regular rhythm  Abdomen  - soft, nontender  Extremities:  No swelling or varicosities noted      via [place] WC:HENIDPOEUMP uterus,11+6 wks,measurements c/w dates,crl 53.84 mm,NB present,NT 1 mm,normal ovaries,fhr 171 bpm,anterior placenta  Results for orders placed or performed in visit on 04/11/19 (from the past 24 hour(s))  POC Urinalysis Dipstick OB   Collection Time: 04/11/19  9:30 AM  Result Value Ref Range   Color, UA     Clarity, UA     Glucose, UA Negative Negative   Bilirubin, UA     Ketones, UA neg    Spec Grav, UA     Blood, UA neg    pH, UA     POC,PROTEIN,UA Negative Negative, Trace, Small (1+), Moderate (2+), Large (3+), 4+   Urobilinogen, UA     Nitrite, UA neg    Leukocytes, UA Negative Negative   Appearance     Odor      Assessment & Plan:  1) Low-Risk Pregnancy G2P0010 at [redacted]w[redacted]d with an Estimated Date of Delivery: 10/25/19   2) Initial OB visit  3)  Hypothyroid--increase 90>120mg  NP thyroid   Meds:  Meds ordered this encounter  Medications  . thyroid (NP THYROID) 120 MG tablet    Sig: Take 1 tablet (120 mg total) by mouth daily before breakfast.    Dispense:  30 tablet    Refill:  6    Order Specific Question:  Supervising Provider    Answer:   Duane Lope H [[id number]]  . ondansetron (ZOFRAN ODT) 4 MG disintegrating tablet    Sig: Take 1 tablet (4 mg total) by mouth every 6 (six) hours as needed for nausea.    Dispense:  30 tablet    Refill:  2    Order Specific Question:   Supervising Provider    Answer:   Duane Lope H [[id number]]    Initial labs obtained Continue prenatal vitamins Reviewed n/v relief measures and warning s/s to report Reviewed recommended weight gain based on pre-gravid BMI Encouraged well-balanced diet Watched video for carrier screening/genetic testing:  Genetic Screening discussed First Screen and Integrated Screen: requested Cystic fibrosis screening requested SMA screening requested Fragile X screening requested Ultrasound discussed; fetal survey:  requested CCNC completed  Follow-up: Return in about 4 weeks (around 05/09/2019) for OB Mychart visit.   Orders Placed This Encounter  Procedures  . Urine Culture  . GC/Chlamydia Probe Amp  . Integrated 1  . Obstetric Panel, Including HIV  . Hepatitis C antibody  . Hgb Fractionation Cascade  . MaterniT 21 plus Core, Blood  . Inheritest Core(CF97,SMA,FraX)  . Pain Management Screening Profile (10S)  . POC Urinalysis Dipstick OB    Jacklyn Shell DNP, CNM 04/11/2019 10:19 AM

## 2019-04-11 NOTE — Progress Notes (Signed)
[place] TG:[id number] uterus,11+6 wks,measurements c/w dates,crl 53.84 mm,NB present,NT 1 mm,normal ovaries,fhr 171 bpm,anterior placenta

## 2019-04-11 NOTE — Patient Instructions (Signed)
Cheyenne Moore, I greatly value your feedback.  If you receive a survey following your visit with [place] today, we appreciate you taking the time to fill it out.  Thanks, Cathie Beams, DNP, CNM  [facility] HAS MOVED!!! It is now [facility] ([address] [place], [place] [address]) Entrance located off of [address] Free 24/7 valet parking   Nausea & Vomiting  Have saltine crackers or pretzels by your bed and eat a few bites before you raise your head out of bed in the morning  Eat small frequent meals throughout the day instead of large meals  Drink plenty of fluids throughout the day to stay hydrated, just don't drink a lot of fluids with your meals.  This can make your stomach fill up faster making you feel sick  Do not brush your teeth right after you eat  Products with real ginger are good for nausea, like ginger ale and ginger hard candy Make sure it says made with real ginger!  Sucking on sour candy like lemon heads is also good for nausea  If your prenatal vitamins make you nauseated, take them at night so you will sleep through the nausea  Sea Bands  If you feel like you need medicine for the nausea & vomiting please let us know  If you are unable to keep any fluids or food down please let us know   Constipation  Drink plenty of fluid, preferably water, throughout the day  Eat foods high in fiber such as fruits, vegetables, and grains  Exercise, such as walking, is a good way to keep your bowels regular  Drink warm fluids, especially warm prune juice, or decaf coffee  Eat a 1/2 cup of real oatmeal (not instant), 1/2 cup applesauce, and 1/2-1 cup warm prune juice every day  If needed, you may take Colace (docusate sodium) stool softener once or twice a day to help keep the stool soft.   If you still are having problems with constipation, you may take Miralax once daily as needed to help keep your bowels regular.    Home Blood Pressure Monitoring for Patients   Your provider has recommended that you check your blood pressure (BP) at least once a week at home. If you do not have a blood pressure cuff at home, one will be provided for you. Contact your provider if you have not received your monitor within 1 week.   Helpful Tips for Accurate Home Blood Pressure Checks  . Don't smoke, exercise, or drink caffeine 30 minutes before checking your BP . Use the restroom before checking your BP (a full bladder can raise your pressure) . Relax in a comfortable upright chair . Feet on the ground . Left arm resting comfortably on a flat surface at the level of your heart . Legs uncrossed . Back supported . Sit quietly and don't talk . Place the cuff on your bare arm . Adjust snuggly, so that only two fingertips can fit between your skin and the top of the cuff . Check 2 readings separated by at least one minute . Keep a log of your BP readings . For a visual, please reference this diagram: http://ccnc.care/bpdiagram  Provider Name: [facility]     Phone: [phone]  Zone 1: ALL CLEAR  Continue to monitor your symptoms:  . BP reading is less than 140 (top number) or less than 90 (bottom number)  . No right upper stomach  pain . No headaches or seeing spots . No feeling nauseated or throwing up . No swelling in face and hands  Zone 2: CAUTION Call your doctor's office for any of the following:  . BP reading is greater than 140 (top number) or greater than 90 (bottom number)  . Stomach pain under your ribs in the middle or right side . Headaches or seeing spots . Feeling nauseated or throwing up . Swelling in face and hands  Zone 3: EMERGENCY  Seek immediate medical care if you have any of the following:  . BP reading is greater than160 (top number) or greater than 110 (bottom number) . Severe headaches not improving with Tylenol . Serious difficulty catching your breath . Any worsening  symptoms from Zone 2    First Trimester of Pregnancy The first trimester of pregnancy is from week 1 until the end of week 12 (months 1 through 3). A week after a sperm fertilizes an egg, the egg will implant on the wall of the uterus. This embryo will begin to develop into a baby. Genes from you and your partner are forming the baby. The female genes determine whether the baby is a boy or a girl. At 6-8 weeks, the eyes and face are formed, and the heartbeat can be seen on ultrasound. At the end of 12 weeks, all the baby's organs are formed.  Now that you are pregnant, you will want to do everything you can to have a healthy baby. Two of the most important things are to get good prenatal care and to follow your health care provider's instructions. Prenatal care is all the medical care you receive before the baby's birth. This care will help prevent, find, and treat any problems during the pregnancy and childbirth. BODY CHANGES Your body goes through many changes during pregnancy. The changes vary from woman to woman.   You may gain or lose a couple of pounds at first.  You may feel sick to your stomach (nauseous) and throw up (vomit). If the vomiting is uncontrollable, call your health care provider.  You may tire easily.  You may develop headaches that can be relieved by medicines approved by your health care provider.  You may urinate more often. Painful urination may mean you have a bladder infection.  You may develop heartburn as a result of your pregnancy.  You may develop constipation because certain hormones are causing the muscles that push waste through your intestines to slow down.  You may develop hemorrhoids or swollen, bulging veins (varicose veins).  Your breasts may begin to grow larger and become tender. Your nipples may stick out more, and the tissue that surrounds them (areola) may become darker.  Your gums may bleed and may be sensitive to brushing and flossing.  Dark  spots or blotches (chloasma, mask of pregnancy) may develop on your face. This will likely fade after the baby is born.  Your menstrual periods will stop.  You may have a loss of appetite.  You may develop cravings for certain kinds of food.  You may have changes in your emotions from day to day, such as being excited to be pregnant or being concerned that something may go wrong with the pregnancy and baby.  You may have more vivid and strange dreams.  You may have changes in your hair. These can include thickening of your hair, rapid growth, and changes in texture. Some women also have hair loss during or after pregnancy, or hair that  feels dry or thin. Your hair will most likely return to normal after your baby is born. WHAT TO EXPECT AT YOUR PRENATAL VISITS During a routine prenatal visit:  You will be weighed to make sure you and the baby are growing normally.  Your blood pressure will be taken.  Your abdomen will be measured to track your baby's growth.  The fetal heartbeat will be listened to starting around week 10 or 12 of your pregnancy.  Test results from any previous visits will be discussed. Your health care provider may ask you:  How you are feeling.  If you are feeling the baby move.  If you have had any abnormal symptoms, such as leaking fluid, bleeding, severe headaches, or abdominal cramping.  If you have any questions. Other tests that may be performed during your first trimester include:  Blood tests to find your blood type and to check for the presence of any previous infections. They will also be used to check for low iron levels (anemia) and Rh antibodies. Later in the pregnancy, blood tests for diabetes will be done along with other tests if problems develop.  Urine tests to check for infections, diabetes, or protein in the urine.  An ultrasound to confirm the proper growth and development of the baby.  An amniocentesis to check for possible genetic  problems.  Fetal screens for spina bifida and Down syndrome.  You may need other tests to make sure you and the baby are doing well. HOME CARE INSTRUCTIONS  Medicines  Follow your health care provider's instructions regarding medicine use. Specific medicines may be either safe or unsafe to take during pregnancy.  Take your prenatal vitamins as directed.  If you develop constipation, try taking a stool softener if your health care provider approves. Diet  Eat regular, well-balanced meals. Choose a variety of foods, such as meat or vegetable-based protein, fish, milk and low-fat dairy products, vegetables, fruits, and whole grain breads and cereals. Your health care provider will help you determine the amount of weight gain that is right for you.  Avoid raw meat and uncooked cheese. These carry germs that can cause birth defects in the baby.  Eating four or five small meals rather than three large meals a day may help relieve nausea and vomiting. If you start to feel nauseous, eating a few soda crackers can be helpful. Drinking liquids between meals instead of during meals also seems to help nausea and vomiting.  If you develop constipation, eat more high-fiber foods, such as fresh vegetables or fruit and whole grains. Drink enough fluids to keep your urine clear or pale yellow. Activity and Exercise  Exercise only as directed by your health care provider. Exercising will help you:  Control your weight.  Stay in shape.  Be prepared for labor and delivery.  Experiencing pain or cramping in the lower abdomen or low back is a good sign that you should stop exercising. Check with your health care provider before continuing normal exercises.  Try to avoid standing for long periods of time. Move your legs often if you must stand in one place for a long time.  Avoid heavy lifting.  Wear low-heeled shoes, and practice good posture.  You may continue to have sex unless your health care  provider directs you otherwise. Relief of Pain or Discomfort  Wear a good support bra for breast tenderness.    Take warm sitz baths to soothe any pain or discomfort caused by hemorrhoids. Use hemorrhoid cream  if your health care provider approves.    Rest with your legs elevated if you have leg cramps or low back pain.  If you develop varicose veins in your legs, wear support hose. Elevate your feet for 15 minutes, 3-4 times a day. Limit salt in your diet. Prenatal Care  Schedule your prenatal visits by the twelfth week of pregnancy. They are usually scheduled monthly at first, then more often in the last 2 months before delivery.  Write down your questions. Take them to your prenatal visits.  Keep all your prenatal visits as directed by your health care provider. Safety  Wear your seat belt at all times when driving.  Make a list of emergency phone numbers, including numbers for family, friends, the hospital, and police and fire departments. General Tips  Ask your health care provider for a referral to a local prenatal education class. Begin classes no later than at the beginning of month 6 of your pregnancy.  Ask for help if you have counseling or nutritional needs during pregnancy. Your health care provider can offer advice or refer you to specialists for help with various needs.  Do not use hot tubs, steam rooms, or saunas.  Do not douche or use tampons or scented sanitary pads.  Do not cross your legs for long periods of time.  Avoid cat litter boxes and soil used by cats. These carry germs that can cause birth defects in the baby and possibly loss of the fetus by miscarriage or stillbirth.  Avoid all smoking, herbs, alcohol, and medicines not prescribed by your health care provider. Chemicals in these affect the formation and growth of the baby.  Schedule a dentist appointment. At home, brush your teeth with a soft toothbrush and be gentle when you floss. SEEK MEDICAL  CARE IF:   You have dizziness.  You have mild pelvic cramps, pelvic pressure, or nagging pain in the abdominal area.  You have persistent nausea, vomiting, or diarrhea.  You have a bad smelling vaginal discharge.  You have pain with urination.  You notice increased swelling in your face, hands, legs, or ankles. SEEK IMMEDIATE MEDICAL CARE IF:   You have a fever.  You are leaking fluid from your vagina.  You have spotting or bleeding from your vagina.  You have severe abdominal cramping or pain.  You have rapid weight gain or loss.  You vomit blood or material that looks like coffee grounds.  You are exposed to [place] measles and have never had them.  You are exposed to fifth disease or chickenpox.  You develop a severe headache.  You have shortness of breath.  You have any kind of trauma, such as from a fall or a car accident. Document Released: 12/28/2000 Document Revised: 05/20/2013 Document Reviewed: 11/13/2012 [facility] Patient Information 2015 [place], [place]. This information is not intended to replace advice given to you by your health care provider. Make sure you discuss any questions you have with your health care provider.  Coronavirus (COVID-19) Are you at risk?  Are you at risk for the Coronavirus (COVID-19)?  To be considered HIGH RISK for Coronavirus (COVID-19), you have to meet the following criteria:  . Traveled to [place], [place], [place], [place] or [place]; or in the [place] to [place], [place], [place], or [place]; and have fever, cough, and shortness of breath within the last 2 weeks of travel OR . Been in close contact with a person diagnosed with COVID-19 within the last 2  weeks and have fever, cough, and shortness of breath . IF YOU DO NOT MEET THESE CRITERIA, YOU ARE CONSIDERED LOW RISK FOR COVID-19.  What to do if you are HIGH RISK for COVID-19?  Marland Kitchen If you are having a medical emergency, call 911. . Seek medical care right away.  Before you go to a doctor's office, urgent care or emergency department, call ahead and tell them about your recent travel, contact with someone diagnosed with COVID-19, and your symptoms. You should receive instructions from your physician's office regarding next steps of care.  . When you arrive at healthcare provider, tell the healthcare staff immediately you have returned from visiting [place], [place], [place], [place] or [place]; or traveled in the [place] to [place], [place], [place], or [place]; in the last two weeks or you have been in close contact with a person diagnosed with COVID-19 in the last 2 weeks.   . Tell the health care staff about your symptoms: fever, cough and shortness of breath. . After you have been seen by a medical provider, you will be either: o Tested for (COVID-19) and discharged home on quarantine except to seek medical care if symptoms worsen, and asked to  - Stay home and avoid contact with others until you get your results (4-5 days)  - Avoid travel on public transportation if possible (such as bus, train, or airplane) or o Sent to the Emergency Department by EMS for evaluation, COVID-19 testing, and possible admission depending on your condition and test results.  What to do if you are LOW RISK for COVID-19?  Reduce your risk of any infection by using the same precautions used for avoiding the common cold or flu:  Marland Kitchen Wash your hands often with soap and warm water for at least 20 seconds.  If soap and water are not readily available, use an alcohol-based hand sanitizer with at least 60% alcohol.  . If coughing or sneezing, cover your mouth and nose by coughing or sneezing into the elbow areas of your shirt or coat, into a tissue or into your sleeve (not your hands). . Avoid shaking hands with others and consider head nods or verbal greetings only. . Avoid touching your eyes, nose, or mouth with unwashed hands.  . Avoid close contact with people who are  sick. . Avoid places or events with large numbers of people in one location, like concerts or sporting events. . Carefully consider travel plans you have or are making. . If you are planning any travel outside or inside the [place], visit the CDC's Travelers' Health webpage for the latest health notices. . If you have some symptoms but not all symptoms, continue to monitor at home and seek medical attention if your symptoms worsen. . If you are having a medical emergency, call 911.   [place] / e-Visit: [email or web address]         [facility]: [facility]: [phone]                   [facility]: [phone]     Safe Medications in Pregnancy   Acne: Benzoyl Peroxide Salicylic Acid  Backache/Headache: Tylenol: 2 regular strength every 4 hours OR              2 Extra strength every 6 hours  Colds/Coughs/Allergies: Benadryl (alcohol free) 25 mg every 6 hours as needed Breath right strips Claritin  Cepacol throat lozenges Chloraseptic throat spray Cold-Eeze- up to three times per day Cough drops, alcohol free Flonase (by prescription only) Guaifenesin Mucinex Robitussin DM (plain only, alcohol free) Saline nasal spray/drops Sudafed (pseudoephedrine) & Actifed ** use only after [redacted] weeks gestation and if you do not have high blood pressure Tylenol Vicks Vaporub Zinc lozenges Zyrtec   Constipation: Colace Ducolax suppositories Fleet enema Glycerin suppositories Metamucil Milk of magnesia Miralax Senokot Smooth move tea  Diarrhea: Kaopectate Imodium A-D  *NO pepto Bismol  Hemorrhoids: Anusol Anusol HC Preparation H Tucks  Indigestion: Tums Maalox Mylanta Zantac  Pepcid  Insomnia: Benadryl (alcohol free) 69m every 6 hours as needed Tylenol PM Unisom, no Gelcaps  Leg  Cramps: Tums MagGel  Nausea/Vomiting:  Bonine Dramamine Emetrol Ginger extract Sea bands Meclizine  Nausea medication to take during pregnancy:  Unisom (doxylamine succinate 25 mg tablets) Take one tablet daily at bedtime. If symptoms are not adequately controlled, the dose can be increased to a maximum recommended dose of two tablets daily (1/2 tablet in the morning, 1/2 tablet mid-afternoon and one at bedtime). Vitamin B6 1035mtablets. Take one tablet twice a day (up to 200 mg per day).  Skin Rashes: Aveeno products Benadryl cream or 2582mvery 6 hours as needed Calamine Lotion 1% cortisone cream  Yeast infection: Gyne-lotrimin 7 Monistat 7   **If taking multiple medications, please check labels to avoid duplicating the same active ingredients **take medication as directed on the label ** Do not exceed 4000 mg of tylenol in 24 hours **Do not take medications that contain aspirin or ibuprofen

## 2019-04-12 LAB — PMP SCREEN PROFILE (10S), URINE
Amphetamine Scrn, Ur: NEGATIVE ng/mL
BARBITURATE SCREEN URINE: NEGATIVE ng/mL
BENZODIAZEPINE SCREEN, URINE: NEGATIVE ng/mL
CANNABINOIDS UR QL SCN: NEGATIVE ng/mL
Cocaine (Metab) Scrn, Ur: NEGATIVE ng/mL
Creatinine(Crt), U: 185.9 mg/dL (ref 20.0–300.0)
Methadone Screen, Urine: NEGATIVE ng/mL
OXYCODONE+OXYMORPHONE UR QL SCN: NEGATIVE ng/mL
Opiate Scrn, Ur: NEGATIVE ng/mL
Ph of Urine: 5.3 (ref 4.5–8.9)
Phencyclidine Qn, Ur: NEGATIVE ng/mL
Propoxyphene Scrn, Ur: NEGATIVE ng/mL

## 2019-04-13 LAB — URINE CULTURE: Organism ID, Bacteria: NO GROWTH

## 2019-04-14 LAB — GC/CHLAMYDIA PROBE AMP
Chlamydia trachomatis, NAA: NEGATIVE
Neisseria Gonorrhoeae by PCR: NEGATIVE

## 2019-04-18 ENCOUNTER — Telehealth: Payer: Self-pay | Admitting: *Deleted

## 2019-04-18 ENCOUNTER — Telehealth: Payer: Self-pay | Admitting: Obstetrics & Gynecology

## 2019-04-18 NOTE — Telephone Encounter (Signed)
Patient called the after hours nurse line last night, and she let them know that she has been having pressure. Pt is 12 weeks and 6 days pregnant. The after hours nurse has advised her to try and get an appointment today. Please contact pt

## 2019-04-18 NOTE — Telephone Encounter (Signed)
Patient states she is continuing to have intermittent pressure in her pelvis when she walks.  Denies bleeding, burning with urination, urgency or frequency and only occasional cramping. Informed the pressure is most likely due to her growing uterus but to continue to monitor.  Encouraged to push fluids and if she noticed any bleeding or urinary changes to let us know or [facility]  Pt verbalized understanding with no further questions.

## 2019-04-19 LAB — MATERNIT 21 PLUS CORE, BLOOD
Fetal Fraction: 5
Result (T21): NEGATIVE
Trisomy 13 (Patau syndrome): NEGATIVE
Trisomy 18 (Edwards syndrome): NEGATIVE
Trisomy 21 (Down syndrome): NEGATIVE

## 2019-04-19 LAB — OBSTETRIC PANEL, INCLUDING HIV
Antibody Screen: NEGATIVE
Basophils Absolute: 0 10*3/uL (ref 0.0–0.2)
Basos: 0 %
EOS (ABSOLUTE): 0.1 10*3/uL (ref 0.0–0.4)
Eos: 1 %
HIV Screen 4th Generation wRfx: NONREACTIVE
Hematocrit: 38.7 % (ref 34.0–46.6)
Hemoglobin: 13.6 g/dL (ref 11.1–15.9)
Hepatitis B Surface Ag: NEGATIVE
Immature Grans (Abs): 0 10*3/uL (ref 0.0–0.1)
Immature Granulocytes: 0 %
Lymphocytes Absolute: 1.5 10*3/uL (ref 0.7–3.1)
Lymphs: 16 %
MCH: 30.1 pg (ref 26.6–33.0)
MCHC: 35.1 g/dL (ref 31.5–35.7)
MCV: 86 fL (ref 79–97)
Monocytes Absolute: 0.5 10*3/uL (ref 0.1–0.9)
Monocytes: 5 %
Neutrophils Absolute: 7.4 10*3/uL — ABNORMAL HIGH (ref 1.4–7.0)
Neutrophils: 78 %
Platelets: 274 10*3/uL (ref 150–450)
RBC: 4.52 x10E6/uL (ref 3.77–5.28)
RDW: 12.8 % (ref 11.7–15.4)
RPR Ser Ql: NONREACTIVE
Rh Factor: POSITIVE
Rubella Antibodies, IGG: 3.32 index (ref >0.99)
WBC: 9.5 10*3/uL (ref 3.4–10.8)

## 2019-04-19 LAB — INTEGRATED 1
Crown Rump Length: 53.8 mm
Gest. Age on Collection Date: 11.9 weeks
Maternal Age at EDD: 27.6 yr
Nuchal Translucency (NT): 1 mm
Number of Fetuses: 1
PAPP-A Value: 258.3 ng/mL
Weight: 291 [lb_av]

## 2019-04-19 LAB — HGB FRACTIONATION CASCADE
Hgb A2: 2.6 % (ref 1.8–3.2)
Hgb A: 97.4 % (ref 96.4–98.8)
Hgb F: 0 % (ref 0.0–2.0)
Hgb S: 0 %

## 2019-04-19 LAB — INHERITEST CORE(CF97,SMA,FRAX)

## 2019-04-19 LAB — HEPATITIS C ANTIBODY: Hep C Virus Ab: <0.1 s/co ratio (ref 0.0–0.9)

## 2019-05-09 ENCOUNTER — Telehealth (INDEPENDENT_AMBULATORY_CARE_PROVIDER_SITE_OTHER): Payer: 59 | Admitting: Obstetrics and Gynecology

## 2019-05-09 ENCOUNTER — Encounter: Payer: Self-pay | Admitting: Obstetrics and Gynecology

## 2019-05-09 DIAGNOSIS — Z3A15 15 weeks gestation of pregnancy: Secondary | ICD-10-CM | POA: Diagnosis not present

## 2019-05-09 DIAGNOSIS — Z3482 Encounter for supervision of other normal pregnancy, second trimester: Secondary | ICD-10-CM

## 2019-05-09 MED ORDER — OMEPRAZOLE 20 MG PO CPDR: 20.0000 mg | DELAYED_RELEASE_CAPSULE | Freq: Every day | ORAL | 3 refills | Status: DC

## 2019-05-09 NOTE — Progress Notes (Signed)
Patient ID: Cheyenne Moore, female   DOB: 06-15-1990, 29 y.o.   MRN: [id number]    TELEHEALTH VIRTUAL OBSTETRICS VISIT ENCOUNTER NOTE Patient name: Cheyenne Moore MRN [id number]  Date of birth: 1990/08/22  I connected with patient on 05/09/19 at  9:50 AM EDT by telehea and verified that I am speaking with the correct person using two identifiers. Due to COVID-19 recommendations, pt is not currently in our office.    I discussed the limitations, risks, security and privacy concerns of performing an evaluation and management service by telephone and the availability of in person appointments. I also discussed with the patient that there may be a patient responsible charge related to this service. The patient expressed understanding and agreed to proceed.  Chief Complaint:   No chief complaint on file.  History of Present Illness:   Cheyenne Moore is a 29 y.o. G2P0010 female at [redacted]w[redacted]d with an Estimated Date of Delivery: 10/25/19 being evaluated today for ongoing management of a low-risk pregnancy.   She declines any medicate to treat acid reflux. She is only experiencing occasional heartburn at this point.   Depression screen [facility] 2/9 04/11/2019 05/03/2018 10/31/2017  Decreased Interest 0 0 0  Down, Depressed, Hopeless 0 0 0  PHQ - 2 Score 0 0 0  Altered sleeping 0 - -  Tired, decreased energy 0 - -  Change in appetite 0 - -  Feeling bad or failure about yourself  0 - -  Trouble concentrating 0 - -  Moving slowly or fidgety/restless 0 - -  Suicidal thoughts 0 - -  PHQ-9 Score 0 - -   Today she reports vomiting and dizziness, with reports of occasional low blood sugar in the 60's and 70's.  .  .   . denies leaking of fluid.  Review of Systems:   Pertinent items are noted in HPI Denies abnormal vaginal discharge w/ itching/odor/irritation, headaches, visual changes, shortness of breath, chest pain, abdominal pain, severe nausea/vomiting, or problems with urination or bowel movements  unless otherwise stated above.  Pertinent History Reviewed:  Reviewed past medical,surgical, social, obstetrical and family history.  Reviewed problem list, medications and allergies.  Physical Assessment:  There were no vitals filed for this visit.There is no height or weight on file to calculate BMI.        Physical Examination:   General:  Alert, oriented and cooperative.   Mental Status: Normal mood and affect perceived. Normal judgment and thought content.  Rest of physical exam deferred due to type of encounter  No results found for this or any previous visit (from the past 24 hour(s)).   Assessment & Plan:  1) Pregnancy G2P0010 at [redacted]w[redacted]d with an Estimated Date of Delivery: 10/25/19   2) U/S Anatomy as scheduled in 4 weeks  3) Recommended routine exercise , minimize weight gain in pregnancy, please snack between even sized meals.   Meds:  Meds ordered this encounter  Medications  . omeprazole (PRILOSEC) 20 MG capsule    Sig: Take 1 capsule (20 mg total) by mouth daily.    Dispense:  30 capsule    Refill:  3   Labs/procedures today: None  Plan:  Continue routine obstetrical care , has   home bp cuff.  Check bp weekly, let us know if >140/90.  Next visit: prefers in person    Reviewed:  labor symptoms and general obstetric precautions including but not limited to vaginal bleeding, contractions, leaking of fluid and fetal movement were  reviewed in detail with the patient. The patient was advised to call back or seek an in-person office evaluation/go to [facility] at [facility] for any urgent or concerning symptoms. All questions were answered. Please refer to After Visit Summary for other counseling recommendations.    I provided 15 minutes of non-face-to-face time during this encounter.  Follow-up: Return in about 4 weeks (around 06/06/2019) for As Scheduled, U/S: ANATOMY.  No orders of the defined types were placed in this encounter.  By signing my name below,  I, [employer], attest that this documentation has been prepared under the direction and in the presence of Jonnie Kind, MD. Electronically Signed: [place]. 05/09/19. 10:08 AM.  I personally performed the services described in this documentation, which was SCRIBED in my presence. The recorded information has been reviewed and considered accurate. It has been edited as necessary during review. Jonnie Kind, MD

## 2019-05-20 ENCOUNTER — Other Ambulatory Visit (INDEPENDENT_AMBULATORY_CARE_PROVIDER_SITE_OTHER): Payer: 59

## 2019-05-20 ENCOUNTER — Other Ambulatory Visit: Payer: Self-pay

## 2019-05-20 ENCOUNTER — Telehealth: Payer: Self-pay | Admitting: Advanced Practice Midwife

## 2019-05-20 ENCOUNTER — Telehealth: Payer: Self-pay | Admitting: *Deleted

## 2019-05-20 DIAGNOSIS — Z3482 Encounter for supervision of other normal pregnancy, second trimester: Secondary | ICD-10-CM

## 2019-05-20 DIAGNOSIS — R3915 Urgency of urination: Secondary | ICD-10-CM | POA: Diagnosis not present

## 2019-05-20 LAB — POCT URINALYSIS DIPSTICK OB
Blood, UA: NEGATIVE
Glucose, UA: NEGATIVE
Leukocytes, UA: NEGATIVE
Nitrite, UA: NEGATIVE
POC,PROTEIN,UA: NEGATIVE

## 2019-05-20 MED FILL — NP THYROID 120 MG TABLET: 120 | 30 days supply | Qty: 30 | Fill #0

## 2019-05-20 NOTE — Telephone Encounter (Signed)
Patient states she is having the urge to urinate but is having difficulty.  Will have patient come in for urine dip.

## 2019-05-20 NOTE — Telephone Encounter (Signed)
Patient called stating that she is pregnant and she is having a hard time urinating. Pt states that she has this urgency to pee but nothing comes out. Please contact pt

## 2019-05-20 NOTE — Progress Notes (Signed)
   NURSE VISIT- UTI SYMPTOMS   SUBJECTIVE:  Cheyenne Moore is a 29 y.o. G56P0010 female here for UTI symptoms. She is [redacted]w[redacted]d pregnant. She reports urinary urgency.  OBJECTIVE:  LMP 01/18/2019   Appears well, in no apparent distress  Results for orders placed or performed in visit on 05/20/19 (from the past 24 hour(s))  POC Urinalysis Dipstick OB   Collection Time: 05/20/19  3:33 PM  Result Value Ref Range   Color, UA     Clarity, UA     Glucose, UA Negative Negative   Bilirubin, UA     Ketones, UA large    Spec Grav, UA     Blood, UA neg    pH, UA     POC,PROTEIN,UA Negative Negative, Trace, Small (1+), Moderate (2+), Large (3+), 4+   Urobilinogen, UA     Nitrite, UA neg    Leukocytes, UA Negative Negative   Appearance     Odor      ASSESSMENT: Pregnancy [redacted]w[redacted]d with UTI symptoms and negative nitrites  PLAN: Discussed with Dr. Despina Hidden   Rx sent by provider today: No Urine culture sent Call or return to clinic prn if these symptoms worsen or fail to improve as anticipated. Follow-up: as scheduled   Annamarie Dawley  05/20/2019 3:34 PM

## 2019-05-22 ENCOUNTER — Inpatient Hospital Stay (HOSPITAL_COMMUNITY)
Admission: AD | Admit: 2019-05-22 | Discharge: 2019-05-22 | Disposition: A | Discharge status: Home / Self Care | Payer: 59 | Attending: Obstetrics and Gynecology | Admitting: Obstetrics and Gynecology

## 2019-05-22 ENCOUNTER — Encounter (HOSPITAL_COMMUNITY): Payer: Self-pay | Admitting: Obstetrics and Gynecology

## 2019-05-22 ENCOUNTER — Other Ambulatory Visit: Payer: Self-pay

## 2019-05-22 DIAGNOSIS — R1030 Lower abdominal pain, unspecified: Secondary | ICD-10-CM | POA: Insufficient documentation

## 2019-05-22 DIAGNOSIS — Z3A17 17 weeks gestation of pregnancy: Secondary | ICD-10-CM

## 2019-05-22 DIAGNOSIS — R109 Unspecified abdominal pain: Secondary | ICD-10-CM | POA: Diagnosis not present

## 2019-05-22 DIAGNOSIS — O26892 Other specified pregnancy related conditions, second trimester: Secondary | ICD-10-CM

## 2019-05-22 DIAGNOSIS — R350 Frequency of micturition: Secondary | ICD-10-CM

## 2019-05-22 LAB — URINALYSIS, ROUTINE W REFLEX MICROSCOPIC
Bilirubin Urine: NEGATIVE
Glucose, UA: NEGATIVE mg/dL
Hgb urine dipstick: NEGATIVE
Ketones, ur: NEGATIVE mg/dL
Leukocytes,Ua: NEGATIVE
Nitrite: NEGATIVE
Protein, ur: NEGATIVE mg/dL
Specific Gravity, Urine: 1.011 (ref 1.005–1.030)
pH: 6 (ref 5.0–8.0)

## 2019-05-22 LAB — URINE CULTURE

## 2019-05-22 NOTE — MAU Note (Signed)
.   Cheyenne Moore is a 29 y.o. at [redacted]w[redacted]d here in MAU reporting: urine frequency that started Saturday, went to Dr on Monday and was told her urine was ok. Pt continues to have urine frequency with lower abdominal discomfort at times LMP: 01/18/19 Onset of complaint: Saturday  Pain score: 0 Vitals:   05/22/19 1636 05/22/19 1637  BP: (!) 158/92   Pulse: (!) 105   Resp: 16   Temp: 98.7 F (37.1 C)   SpO2:  99%     FHT:  Lab orders placed from triage: UA

## 2019-05-22 NOTE — Discharge Instructions (Signed)
Abdominal Pain During Pregnancy  Abdominal pain is common during pregnancy, and has many possible causes. Some causes are more serious than others, and sometimes the cause is not known. Abdominal pain can be a sign that labor is starting. It can also be caused by normal growth and stretching of muscles and ligaments during pregnancy. Always tell your health care provider if you have any abdominal pain. Follow these instructions at home:  Do not have sex or put anything in your vagina until your pain goes away completely.  Get plenty of rest until your pain improves.  Drink enough fluid to keep your urine pale yellow.  Take over-the-counter and prescription medicines only as told by your health care provider.  Keep all follow-up visits as told by your health care provider. This is important. Contact a health care provider if:  Your pain continues or gets worse after resting.  You have lower abdominal pain that: ? Comes and goes at regular intervals. ? Spreads to your back. ? Is similar to menstrual cramps.  You have pain or burning when you urinate. Get help right away if:  You have a fever or chills.  You have vaginal bleeding.  You are leaking fluid from your vagina.  You are passing tissue from your vagina.  You have vomiting or diarrhea that lasts for more than 24 hours.  Your baby is moving less than usual.  You feel very weak or faint.  You have shortness of breath.  You develop severe pain in your upper abdomen. Summary  Abdominal pain is common during pregnancy, and has many possible causes.  If you experience abdominal pain during pregnancy, tell your health care provider right away.  Follow your health care provider's home care instructions and keep all follow-up visits as directed. This information is not intended to replace advice given to you by your health care provider. Make sure you discuss any questions you have with your health care  provider. Document Revised: 04/23/2018 Document Reviewed: 04/07/2016 Elsevier Patient Education  2020 [employer] Inc[employer]  

## 2019-05-22 NOTE — MAU Provider Note (Signed)
Chief Complaint: Urinary Frequency   First Provider Initiated Contact with Patient 05/22/19 1655     SUBJECTIVE HPI: Cheyenne Moore is a 29 y.o. G2P0010 at [redacted]w[redacted]d who presents to [facility] reporting urinary frequency & abdominal pain.  Has noticed increase urinary frequency for the last week. Had a negative urine dip in the office earlier this week. Denies dysuria, hematuria, fever, flank pain. Also has had intermittent lower abdominal pain & are sporadic & infrequent. No LOF or vaginal bleeding.    Past Medical History:  Diagnosis Date  . Allergic rhinitis   . GERD (gastroesophageal reflux disease)   . Thyroid disease    hypothyroid   OB History  Gravida Para Term Preterm AB Living  2 0 0 0 1 0  SAB TAB Ectopic Multiple Live Births  1 0 0 0 0    # Outcome Date GA Lbr Len/2nd Weight Sex Delivery Anes PTL Lv  2 Current           1 SAB 07/01/18 [redacted]w[redacted]d            Birth Comments: 9.4 week sized fetus   Past Surgical History:  Procedure Laterality Date  . NO PAST SURGERIES     Social History   Socioeconomic History  . Marital status: Married    Spouse name: Onalee Hua   . Number of children: Not on file  . Years of education: Not on file  . Highest education level: Not on file  Occupational History  . Occupation: Curator: [employer]  Tobacco Use  . Smoking status: Never Smoker  . Smokeless tobacco: Never Used  Substance and Sexual Activity  . Alcohol use: No  . Drug use: No  . Sexual activity: Yes    Birth control/protection: None  Other Topics Concern  . Not on file  Social History Narrative  . Not on file   Social Determinants of Health   Financial Resource Strain: Low Risk   . Difficulty of Paying Living Expenses: Not hard at all  Food Insecurity: No Food Insecurity  . Worried About Programme researcher, broadcasting/film/video in the Last Year: Never true  . Ran Out of Food in the Last Year: Never true  Transportation Needs: No Transportation Needs  . Lack of  Transportation (Medical): No  . Lack of Transportation (Non-Medical): No  Physical Activity: Insufficiently Active  . Days of Exercise per Week: 1 day  . Minutes of Exercise per Session: 30 min  Stress: No Stress Concern Present  . Feeling of Stress : Not at all  Social Connections: Not Isolated  . Frequency of Communication with Friends and Family: More than three times a week  . Frequency of Social Gatherings with Friends and Family: More than three times a week  . Attends Religious Services: More than 4 times per year  . Active Member of Clubs or Organizations: Yes  . Attends Banker Meetings: More than 4 times per year  . Marital Status: Married  Catering manager Violence: Not At Risk  . Fear of Current or Ex-Partner: No  . Emotionally Abused: No  . Physically Abused: No  . Sexually Abused: No   Family History  Problem Relation Age of Onset  . Diabetes Father   . Hypertension Father   . Heart failure Father   . Diabetes Mother   . Hypertension Mother   . Kidney failure Mother   . Cancer Paternal Grandfather  lung  . Cancer Maternal Grandmother        kidney   No current facility-administered medications on file prior to encounter.   Current Outpatient Medications on File Prior to Encounter  Medication Sig Dispense Refill  . cholecalciferol (VITAMIN D) 1000 units tablet Take 10,000 Units by mouth daily.     . ondansetron (ZOFRAN ODT) 4 MG disintegrating tablet Take 1 tablet (4 mg total) by mouth every 6 (six) hours as needed for nausea. 30 tablet 2  . Prenatal Vit-Fe Fumarate-FA (PRENATAL MULTIVITAMIN) TABS tablet Take 1 tablet by mouth daily at 12 noon.    . thyroid (NP THYROID) 120 MG tablet Take 1 tablet (120 mg total) by mouth daily before breakfast. 30 tablet 6   Allergies  Allergen Reactions  . Red Dye Other (See Comments)    GI upset    I have reviewed patient's Past Medical Hx, Surgical Hx, Family Hx, Social Hx, medications and allergies.    Review of Systems  Constitutional: Negative.   Gastrointestinal: Positive for abdominal pain. Negative for constipation, diarrhea, nausea, rectal pain and vomiting.  Genitourinary: Positive for frequency. Negative for dysuria, flank pain, hematuria, urgency, vaginal bleeding and vaginal discharge.    OBJECTIVE Patient Vitals for the past 24 hrs:  BP Temp Pulse Resp SpO2 Height Weight  05/22/19 1750 135/83 -- (!) 105 15 -- -- --  05/22/19 1652 135/83 -- -- -- -- -- --  05/22/19 1637 -- -- -- -- 99 % -- --  05/22/19 1636 (!) 158/92 98.7 F (37.1 C) (!) 105 16 -- 5\' 6"  (1.676 m) 134.3 kg   Constitutional: Well-developed, well-nourished female in no acute distress.  Cardiovascular: normal rate & rhythm, no murmur Respiratory: normal rate and effort. Lung sounds clear throughout GI:No CVA tenderness. Abd soft, non-tender, Pos BS x 4. No guarding or rebound tenderness MS: Extremities nontender, no edema, normal ROM Neurologic: Alert and oriented x 4.  GU:    Cervix closed/thick  LAB RESULTS Results for orders placed or performed during the hospital encounter of 05/22/19 (from the past 24 hour(s))  Urinalysis, Routine w reflex microscopic     Status: Abnormal   Collection Time: 05/22/19  4:43 PM  Result Value Ref Range   Color, Urine YELLOW YELLOW   APPearance HAZY (A) CLEAR   Specific Gravity, Urine 1.011 1.005 - 1.030   pH 6.0 5.0 - 8.0   Glucose, UA NEGATIVE NEGATIVE mg/dL   Hgb urine dipstick NEGATIVE NEGATIVE   Bilirubin Urine NEGATIVE NEGATIVE   Ketones, ur NEGATIVE NEGATIVE mg/dL   Protein, ur NEGATIVE NEGATIVE mg/dL   Nitrite NEGATIVE NEGATIVE   Leukocytes,Ua NEGATIVE NEGATIVE    IMAGING No results found.  MAU COURSE Orders Placed This Encounter  Procedures  . Urinalysis, Routine w reflex microscopic  . Discharge patient   No orders of the defined types were placed in this encounter.   MDM RN unable to doppler FHT. BSUS performed  Pt informed that the  ultrasound is considered a limited OB ultrasound and is not intended to be a complete ultrasound exam.  Patient also informed that the ultrasound is not being completed with the intent of assessing for fetal or placental anomalies or any pelvic abnormalities.  Explained that the purpose of today's ultrasound is to assess for  viability.  Patient acknowledges the purpose of the exam and the limitations of the study.  Active fetus with FHR 156 bpm  U/a negative for signs of infection. Urine culture sent due to  patient's symptoms & for reassurance.   Cervix closed/thick. No abdominal pain here in MAU. Pt reassured.    ASSESSMENT 1. Abdominal pain during pregnancy in second trimester   2. [redacted] weeks gestation of pregnancy     PLAN Discharge home in stable condition. Discussed reasons to return to MAU Urine culture pending   Allergies as of 05/22/2019      Reactions   Red Dye Other (See Comments)   GI upset      Medication List    STOP taking these medications   Bonjesta 20-20 MG Tbcr Generic drug: Doxylamine-Pyridoxine ER   omeprazole 20 MG capsule Commonly known as: PriLOSEC     TAKE these medications   cholecalciferol 1000 units tablet Commonly known as: VITAMIN D Take 10,000 Units by mouth daily.   ondansetron 4 MG disintegrating tablet Commonly known as: Zofran ODT Take 1 tablet (4 mg total) by mouth every 6 (six) hours as needed for nausea.   prenatal multivitamin Tabs tablet Take 1 tablet by mouth daily at 12 noon.   thyroid 120 MG tablet Commonly known as: NP Thyroid Take 1 tablet (120 mg total) by mouth daily before breakfast.        Judeth Horn, NP 05/22/2019  6:47 PM

## 2019-06-04 ENCOUNTER — Other Ambulatory Visit: Payer: Self-pay | Admitting: Obstetrics and Gynecology

## 2019-06-04 DIAGNOSIS — Z363 Encounter for antenatal screening for malformations: Secondary | ICD-10-CM

## 2019-06-05 ENCOUNTER — Ambulatory Visit (INDEPENDENT_AMBULATORY_CARE_PROVIDER_SITE_OTHER): Payer: 59

## 2019-06-05 ENCOUNTER — Other Ambulatory Visit: Payer: Self-pay

## 2019-06-05 ENCOUNTER — Ambulatory Visit (INDEPENDENT_AMBULATORY_CARE_PROVIDER_SITE_OTHER): Payer: 59 | Admitting: Advanced Practice Midwife

## 2019-06-05 VITALS — BP 131/91 | HR 91 | Wt 299.2 lb

## 2019-06-05 DIAGNOSIS — Z363 Encounter for antenatal screening for malformations: Secondary | ICD-10-CM | POA: Diagnosis not present

## 2019-06-05 DIAGNOSIS — Z3480 Encounter for supervision of other normal pregnancy, unspecified trimester: Secondary | ICD-10-CM

## 2019-06-05 DIAGNOSIS — E039 Hypothyroidism, unspecified: Secondary | ICD-10-CM

## 2019-06-05 DIAGNOSIS — Z331 Pregnant state, incidental: Secondary | ICD-10-CM

## 2019-06-05 DIAGNOSIS — Z3A19 19 weeks gestation of pregnancy: Secondary | ICD-10-CM

## 2019-06-05 DIAGNOSIS — O99282 Endocrine, nutritional and metabolic diseases complicating pregnancy, second trimester: Secondary | ICD-10-CM

## 2019-06-05 DIAGNOSIS — Z1379 Encounter for other screening for genetic and chromosomal anomalies: Secondary | ICD-10-CM

## 2019-06-05 DIAGNOSIS — Z1389 Encounter for screening for other disorder: Secondary | ICD-10-CM

## 2019-06-05 LAB — POCT URINALYSIS DIPSTICK OB
Blood, UA: NEGATIVE
Glucose, UA: NEGATIVE
Ketones, UA: NEGATIVE
Leukocytes, UA: NEGATIVE
Nitrite, UA: NEGATIVE
POC,PROTEIN,UA: NEGATIVE

## 2019-06-05 NOTE — Progress Notes (Signed)
LOW-RISK PREGNANCY VISIT Patient name: MARGARETANN ABATE MRN [id number]  Date of birth: 1990/02/15 Chief Complaint:   Routine Prenatal Visit (Ultrasound)  History of Present Illness:   Cheyenne Moore is a 29 y.o. G71P0010 female at [redacted]w[redacted]d with an Estimated Date of Delivery: 10/25/19 being seen today for ongoing management of a low-risk pregnancy. Today she reports doing well- may be dropping hours to prn soon; denies H/A or symptoms of elevated BP. Contractions: Not present. Vag. Bleeding: None.   . denies leaking of fluid. Review of Systems:   Pertinent items are noted in HPI Denies abnormal vaginal discharge w/ itching/odor/irritation, headaches, visual changes, shortness of breath, chest pain, abdominal pain, severe nausea/vomiting, or problems with urination or bowel movements unless otherwise stated above. Pertinent History Reviewed:  Reviewed past medical,surgical, social, obstetrical and family history.  Reviewed problem list, medications and allergies. Physical Assessment:   Vitals:   06/05/19 1002  BP: (!) 131/91  Pulse: 91  Weight: 299 lb 3.2 oz (135.7 kg)  Body mass index is 48.29 kg/m.        Physical Examination:   General appearance: Well appearing, and in no distress  Mental status: Alert, oriented to person, place, and time  Skin: Warm & dry  Cardiovascular: Normal heart rate noted  Respiratory: Normal respiratory effort, no distress  Abdomen: Soft, gravid, nontender  Pelvic: Cervical exam deferred         Extremities: Edema: Trace  Fetal Status: Fetal Heart Rate (bpm): 157 u/s         Anatomy u/s: [place] 19+6 wks,breech,anterior placenta gr 0,normal ovaries,cx 4.3 cm,svp of fluid 5.6 cm,fhr 157 bpm,efw 305 g 41%,limited view of heart because of pt body habitus,please have pt come back for additional images   Results for orders placed or performed in visit on 06/05/19 (from the past 24 hour(s))  POC Urinalysis Dipstick OB   Collection Time: 06/05/19 10:00 AM    Result Value Ref Range   Color, UA     Clarity, UA     Glucose, UA Negative Negative   Bilirubin, UA     Ketones, UA n    Spec Grav, UA     Blood, UA n    pH, UA     POC,PROTEIN,UA Negative Negative, Trace, Small (1+), Moderate (2+), Large (3+), 4+   Urobilinogen, UA     Nitrite, UA n    Leukocytes, UA Negative Negative   Appearance     Odor      Assessment & Plan:  1) Low-risk pregnancy G2P0010 at [redacted]w[redacted]d with an Estimated Date of Delivery: 10/25/19   2) Hypothyroid, check TSH today and q trimester  3) Elevated BP, will check bid x 1wk at home, then call values in; suspicious for cHTN; start bASA 162mg  daily  4) Limited heart views on fetal U/S, will repeat at next visit   Meds: No orders of the defined types were placed in this encounter.  Labs/procedures today: 2nd IT & TSH  Plan:  Continue routine obstetrical care- pt to call in BP values next week to determine need for antihypertensives  Reviewed: Preterm labor symptoms and general obstetric precautions including but not limited to vaginal bleeding, contractions, leaking of fluid and fetal movement were reviewed in detail with the patient.  All questions were answered. Has home bp cuff. Check bp weekly, let us know if >140/90.   Follow-up: Return in about 4 weeks (around 07/03/2019) for George, in person, [place]: OB F/U heart images.  Orders Placed This Encounter  Procedures  . INTEGRATED 2  . TSH  . POC Urinalysis Dipstick OB   Arabella Merles [facility] 06/05/2019 1:04 PM

## 2019-06-05 NOTE — Progress Notes (Signed)
[place] 19+6 wks,breech,anterior placenta gr 0,normal ovaries,cx 4.3 cm,svp of fluid 5.6 cm,fhr 157 bpm,efw 305 g 41%,limited view of heart because of pt body habitus,please have pt come back for additional images

## 2019-06-05 NOTE — Patient Instructions (Signed)
Cheyenne Moore, I greatly value your feedback.  If you receive a survey following your visit with [place] today, we appreciate you taking the time to fill it out.  Thanks, Philipp Deputy CNM  [facility] ([address] [place], [place] [address]) Entrance C, located off of [address] valet parking  Go to [email or web address] to register for FREE online childbirth classes  [place] Pediatricians/Family Doctors:  [facility] [phone]            [facility] [phone]                 [facility] [phone] (usually not accepting new patients unless you have family there already, you are always welcome to call and ask)       [facility] [phone]       [facility] Pediatricians/Family Doctors:   [facility]: [phone]  [facility]: [phone]  [facility]: [phone]  [facility] Doctors:   [facility]: [phone]   [facility]: [phone]  [facility] Doctors:  [facility]: [phone]    Home Blood Pressure Monitoring for Patients   Your provider has recommended that you check your blood pressure (BP) at least once a week at home. If you do not have a blood pressure cuff at home, one will be provided for you. Contact your provider if you have not received your monitor within 1 week.   Helpful Tips for Accurate Home Blood Pressure Checks  . Don't smoke, exercise, or drink caffeine 30 minutes before checking your BP . Use the restroom before checking your BP (a full bladder can raise your pressure) . Relax in a comfortable upright chair . Feet on the ground . Left arm resting comfortably on a flat surface at the level of your heart . Legs uncrossed . Back supported . Sit quietly and don't talk . Place the cuff on your bare arm . Adjust snuggly, so that only  two fingertips can fit between your skin and the top of the cuff . Check 2 readings separated by at least one minute . Keep a log of your BP readings . For a visual, please reference this diagram: http://ccnc.care/bpdiagram  Provider Name: [facility]     Phone: [phone]  Zone 1: ALL CLEAR  Continue to monitor your symptoms:  . BP reading is less than 140 (top number) or less than 90 (bottom number)  . No right upper stomach pain . No headaches or seeing spots . No feeling nauseated or throwing up . No swelling in face and hands  Zone 2: CAUTION Call your doctor's office for any of the following:  . BP reading is greater than 140 (top number) or greater than 90 (bottom number)  . Stomach pain under your ribs in the middle or right side . Headaches or seeing spots . Feeling nauseated or throwing up . Swelling in face and hands  Zone 3: EMERGENCY  Seek immediate medical care if you have any of the following:  . BP reading is greater than160 (top number) or greater than 110 (bottom number) . Severe headaches not improving with Tylenol . Serious difficulty catching your breath . Any worsening symptoms from Zone 2     Second Trimester of Pregnancy The second trimester is from week 14 through week 27 (months 4 through 6). The second trimester is often a time when you feel your best.  Your body has adjusted to being pregnant, and you begin to feel better physically. Usually, morning sickness has lessened or quit completely, you may have more energy, and you may have an increase in appetite. The second trimester is also a time when the fetus is growing rapidly. At the end of the sixth month, the fetus is about 9 inches long and weighs about 1 pounds. You will likely begin to feel the baby move (quickening) between 16 and 20 weeks of pregnancy. Body changes during your second trimester Your body continues to go through many changes during your second trimester. The changes vary  from woman to woman.  Your weight will continue to increase. You will notice your lower abdomen bulging out.  You may begin to get stretch marks on your hips, abdomen, and breasts.  You may develop headaches that can be relieved by medicines. The medicines should be approved by your health care provider.  You may urinate more often because the fetus is pressing on your bladder.  You may develop or continue to have heartburn as a result of your pregnancy.  You may develop constipation because certain hormones are causing the muscles that push waste through your intestines to slow down.  You may develop hemorrhoids or swollen, bulging veins (varicose veins).  You may have back pain. This is caused by: ? Weight gain. ? Pregnancy hormones that are relaxing the joints in your pelvis. ? A shift in weight and the muscles that support your balance.  Your breasts will continue to grow and they will continue to become tender.  Your gums may bleed and may be sensitive to brushing and flossing.  Dark spots or blotches (chloasma, mask of pregnancy) may develop on your face. This will likely fade after the baby is born.  A dark line from your belly button to the pubic area (linea nigra) may appear. This will likely fade after the baby is born.  You may have changes in your hair. These can include thickening of your hair, rapid growth, and changes in texture. Some women also have hair loss during or after pregnancy, or hair that feels dry or thin. Your hair will most likely return to normal after your baby is born.  What to expect at prenatal visits During a routine prenatal visit:  You will be weighed to make sure you and the fetus are growing normally.  Your blood pressure will be taken.  Your abdomen will be measured to track your baby's growth.  The fetal heartbeat will be listened to.  Any test results from the previous visit will be discussed.  Your health care provider may ask  you:  How you are feeling.  If you are feeling the baby move.  If you have had any abnormal symptoms, such as leaking fluid, bleeding, severe headaches, or abdominal cramping.  If you are using any tobacco products, including cigarettes, chewing tobacco, and electronic cigarettes.  If you have any questions.  Other tests that may be performed during your second trimester include:  Blood tests that check for: ? Low iron levels (anemia). ? High blood sugar that affects pregnant women (gestational diabetes) between 14 and 28 weeks. ? Rh antibodies. This is to check for a protein on red blood cells (Rh factor).  Urine tests to check for infections, diabetes, or protein in the urine.  An ultrasound to confirm the proper growth and development of the baby.  An amniocentesis to check for possible genetic problems.  Fetal screens  for spina bifida and Down syndrome.  HIV (human immunodeficiency virus) testing. Routine prenatal testing includes screening for HIV, unless you choose not to have this test.  Follow these instructions at home: Medicines  Follow your health care provider's instructions regarding medicine use. Specific medicines may be either safe or unsafe to take during pregnancy.  Take a prenatal vitamin that contains at least 600 micrograms (mcg) of folic acid.  If you develop constipation, try taking a stool softener if your health care provider approves. Eating and drinking  Eat a balanced diet that includes fresh fruits and vegetables, whole grains, good sources of protein such as meat, eggs, or tofu, and low-fat dairy. Your health care provider will help you determine the amount of weight gain that is right for you.  Avoid raw meat and uncooked cheese. These carry germs that can cause birth defects in the baby.  If you have low calcium intake from food, talk to your health care provider about whether you should take a daily calcium supplement.  Limit foods that  are high in fat and processed sugars, such as fried and sweet foods.  To prevent constipation: ? Drink enough fluid to keep your urine clear or pale yellow. ? Eat foods that are high in fiber, such as fresh fruits and vegetables, whole grains, and beans. Activity  Exercise only as directed by your health care provider. Most women can continue their usual exercise routine during pregnancy. Try to exercise for 30 minutes at least 5 days a week. Stop exercising if you experience uterine contractions.  Avoid heavy lifting, wear low heel shoes, and practice good posture.  A sexual relationship may be continued unless your health care provider directs you otherwise. Relieving pain and discomfort  Wear a good support bra to prevent discomfort from breast tenderness.  Take warm sitz baths to soothe any pain or discomfort caused by hemorrhoids. Use hemorrhoid cream if your health care provider approves.  Rest with your legs elevated if you have leg cramps or low back pain.  If you develop varicose veins, wear support hose. Elevate your feet for 15 minutes, 3-4 times a day. Limit salt in your diet. Prenatal Care  Write down your questions. Take them to your prenatal visits.  Keep all your prenatal visits as told by your health care provider. This is important. Safety  Wear your seat belt at all times when driving.  Make a list of emergency phone numbers, including numbers for family, friends, the hospital, and police and fire departments. General instructions  Ask your health care provider for a referral to a local prenatal education class. Begin classes no later than the beginning of month 6 of your pregnancy.  Ask for help if you have counseling or nutritional needs during pregnancy. Your health care provider can offer advice or refer you to specialists for help with various needs.  Do not use hot tubs, steam rooms, or saunas.  Do not douche or use tampons or scented sanitary  pads.  Do not cross your legs for long periods of time.  Avoid cat litter boxes and soil used by cats. These carry germs that can cause birth defects in the baby and possibly loss of the fetus by miscarriage or stillbirth.  Avoid all smoking, herbs, alcohol, and unprescribed drugs. Chemicals in these products can affect the formation and growth of the baby.  Do not use any products that contain nicotine or tobacco, such as cigarettes and e-cigarettes. If you need help quitting,  ask your health care provider.  Visit your dentist if you have not gone yet during your pregnancy. Use a soft toothbrush to brush your teeth and be gentle when you floss. Contact a health care provider if:  You have dizziness.  You have mild pelvic cramps, pelvic pressure, or nagging pain in the abdominal area.  You have persistent nausea, vomiting, or diarrhea.  You have a bad smelling vaginal discharge.  You have pain when you urinate. Get help right away if:  You have a fever.  You are leaking fluid from your vagina.  You have spotting or bleeding from your vagina.  You have severe abdominal cramping or pain.  You have rapid weight gain or weight loss.  You have shortness of breath with chest pain.  You notice sudden or extreme swelling of your face, hands, ankles, feet, or legs.  You have not felt your baby move in over an hour.  You have severe headaches that do not go away when you take medicine.  You have vision changes. Summary  The second trimester is from week 14 through week 27 (months 4 through 6). It is also a time when the fetus is growing rapidly.  Your body goes through many changes during pregnancy. The changes vary from woman to woman.  Avoid all smoking, herbs, alcohol, and unprescribed drugs. These chemicals affect the formation and growth your baby.  Do not use any tobacco products, such as cigarettes, chewing tobacco, and e-cigarettes. If you need help quitting, ask your  health care provider.  Contact your health care provider if you have any questions. Keep all prenatal visits as told by your health care provider. This is important. This information is not intended to replace advice given to you by your health care provider. Make sure you discuss any questions you have with your health care provider. Document Released: 12/28/2000 Document Revised: 06/11/2015 Document Reviewed: 03/06/2012 Elsevier Interactive Patient Education  2017 [place] FLU! Because you are pregnant, we at [facility], along with the Centers for Disease Control (CDC), recommend that you receive the flu vaccine to protect yourself and your baby from the flu. The flu is more likely to cause severe illness in pregnant women than in women of reproductive age who are not pregnant. Changes in the immune system, heart, and lungs during pregnancy make pregnant women (and women up to two weeks postpartum) more prone to severe illness from flu, including illness resulting in hospitalization. Flu also may be harmful for a pregnant woman's developing baby. A common flu symptom is fever, which may be associated with neural tube defects and other adverse outcomes for a developing baby. Getting vaccinated can also help protect a baby after birth from flu. (Mom passes antibodies onto the developing baby during her pregnancy.)  A Flu Vaccine is the Best Protection Against Flu Getting a flu vaccine is the first and most important step in protecting against flu. Pregnant women should get a flu shot and not the live attenuated influenza vaccine (LAIV), also known as nasal spray flu vaccine. Flu vaccines given during pregnancy help protect both the mother and her baby from flu. Vaccination has been shown to reduce the risk of flu-associated acute respiratory infection in pregnant women by up to one-half. A 2018 study showed that getting a flu shot reduced a pregnant woman's  risk of being hospitalized with flu by an average of 40 percent. Pregnant women who get a flu  vaccine are also helping to protect their babies from flu illness for the first several months after their birth, when they are too young to get vaccinated.   A Long Record of Safety for Flu Shots in Pregnant Women Flu shots have been given to millions of pregnant women over many years with a good safety record. There is a lot of evidence that flu vaccines can be given safely during pregnancy; though these data are limited for the first trimester. The CDC recommends that pregnant women get vaccinated during any trimester of their pregnancy. It is very important for pregnant women to get the flu shot.   Other Preventive Actions In addition to getting a flu shot, pregnant women should take the same everyday preventive actions the CDC recommends of everyone, including covering coughs, washing hands often, and avoiding people who are sick.  Symptoms and Treatment If you get sick with flu symptoms call your doctor right away. There are antiviral drugs that can treat flu illness and prevent serious flu complications. The CDC recommends prompt treatment for people who have influenza infection or suspected influenza infection and who are at high risk of serious flu complications, such as people with asthma, diabetes (including gestational diabetes), or heart disease. Early treatment of influenza in hospitalized pregnant women has been shown to reduce the length of the hospital stay.  Symptoms Flu symptoms include fever, cough, sore throat, runny or stuffy nose, body aches, headache, chills and fatigue. Some people may also have vomiting and diarrhea. People may be infected with the flu and have respiratory symptoms without a fever.  Early Treatment is Important for Pregnant Women Treatment should begin as soon as possible because antiviral drugs work best when started early (within 48 hours after symptoms  start). Antiviral drugs can make your flu illness milder and make you feel better faster. They may also prevent serious health problems that can result from flu illness. Oral oseltamivir (Tamiflu) is the preferred treatment for pregnant women because it has the most studies available to suggest that it is safe and beneficial. Antiviral drugs require a prescription from your provider. Having a fever caused by flu infection or other infections early in pregnancy may be linked to birth defects in a baby. In addition to taking antiviral drugs, pregnant women who get a fever should treat their fever with Tylenol (acetaminophen) and contact their provider immediately.  When to [place] If you are pregnant and have any of these signs, seek care immediately:  Difficulty breathing or shortness of breath  Pain or pressure in the chest or abdomen  Sudden dizziness  Confusion  Severe or persistent vomiting  High fever that is not responding to Tylenol (or store brand equivalent)  Decreased or no movement of your baby  [email or web address]

## 2019-06-06 LAB — TSH: TSH: 1.44 u[IU]/mL (ref 0.450–4.500)

## 2019-06-07 LAB — INTEGRATED 2
AFP MoM: 1.59
Alpha-Fetoprotein: 45.8 ng/mL
Crown Rump Length: 53.8 mm
DIA MoM: 1.3
DIA Value: 160.2 pg/mL
Estriol, Unconjugated: 1.33 ng/mL
Gest. Age on Collection Date: 11.9 weeks
Gestational Age: 19.7 weeks
Maternal Age at EDD: 27.6 yr
Nuchal Translucency (NT): 1 mm
Nuchal Translucency MoM: 0.83
Number of Fetuses: 1
PAPP-A MoM: 0.77
PAPP-A Value: 258.3 ng/mL
Test Results:: NEGATIVE
Weight: 291 [lb_av]
Weight: 291 [lb_av]
hCG MoM: 2
hCG Value: 25.3 IU/mL
uE3 MoM: 0.81

## 2019-06-10 ENCOUNTER — Encounter (INDEPENDENT_AMBULATORY_CARE_PROVIDER_SITE_OTHER): Payer: Self-pay | Admitting: Internal Medicine

## 2019-06-10 ENCOUNTER — Other Ambulatory Visit: Payer: Self-pay

## 2019-06-10 ENCOUNTER — Ambulatory Visit (INDEPENDENT_AMBULATORY_CARE_PROVIDER_SITE_OTHER): Payer: 59 | Admitting: Internal Medicine

## 2019-06-10 VITALS — BP 142/76 | HR 76 | Temp 96.7°F | Resp 18 | Ht 66.0 in | Wt 305.6 lb

## 2019-06-10 DIAGNOSIS — E039 Hypothyroidism, unspecified: Secondary | ICD-10-CM

## 2019-06-10 LAB — T3, FREE: T3, Free: 4.2 pg/mL (ref 2.3–4.2)

## 2019-06-10 NOTE — Progress Notes (Signed)
Metrics: Intervention Frequency ACO  Documented Smoking Status Yearly  Screened one or more times in 24 months  Cessation Counseling or  Active cessation medication Past 24 months  Past 24 months   Guideline developer: UpToDate (See UpToDate for funding source) Date Released: 2014       Wellness Office Visit  Subjective:  Patient ID: Cheyenne Moore, female    DOB: September 08, 1990  Age: 29 y.o. MRN: [id number]  CC: This lady comes in for follow-up of hypothyroidism. HPI  She is currently just over [redacted] weeks pregnant.  The pregnancy seems to be going well. Her NP thyroid dose was increased by OB/GYN about a month ago.  She has tolerated this higher dose.  TSH was checked post increase but the T3 was not checked. Past Medical History:  Diagnosis Date  . Allergic rhinitis   . GERD (gastroesophageal reflux disease)   . Thyroid disease    hypothyroid      Family History  Problem Relation Age of Onset  . Diabetes Father   . Hypertension Father   . Heart failure Father   . Diabetes Mother   . Hypertension Mother   . Kidney failure Mother   . Cancer Paternal Grandfather        lung  . Cancer Maternal Grandmother        kidney    Social History   Social History Narrative  . Not on file   Social History   Tobacco Use  . Smoking status: Never Smoker  . Smokeless tobacco: Never Used  Substance Use Topics  . Alcohol use: No    Current Meds  Medication Sig  . aspirin 81 MG chewable tablet Chew 162 mg by mouth daily.  . cholecalciferol (VITAMIN D) 1000 units tablet Take 10,000 Units by mouth daily.   Marland Kitchen loratadine (CLARITIN) 10 MG tablet Take 10 mg by mouth daily.  Marland Kitchen omeprazole (PRILOSEC) 20 MG capsule Take 20 mg by mouth daily.  . ondansetron (ZOFRAN ODT) 4 MG disintegrating tablet Take 1 tablet (4 mg total) by mouth every 6 (six) hours as needed for nausea.  . Prenatal Vit-Fe Fumarate-FA (PRENATAL MULTIVITAMIN) TABS tablet Take 1 tablet by mouth daily at 12 noon.  .  thyroid (NP THYROID) 120 MG tablet Take 1 tablet (120 mg total) by mouth daily before breakfast.       Depression screen [facility] 2/9 04/11/2019 05/03/2018 10/31/2017  Decreased Interest 0 0 0  Down, Depressed, Hopeless 0 0 0  PHQ - 2 Score 0 0 0  Altered sleeping 0 - -  Tired, decreased energy 0 - -  Change in appetite 0 - -  Feeling bad or failure about yourself  0 - -  Trouble concentrating 0 - -  Moving slowly or fidgety/restless 0 - -  Suicidal thoughts 0 - -  PHQ-9 Score 0 - -     Objective:   Today's Vitals: BP (!) 142/76 (BP Location: Right Arm, Patient Position: Sitting, Cuff Size: Normal)   Pulse 76   Temp (!) 96.7 F (35.9 C) (Temporal)   Resp 18   Ht 5\' 6"  (1.676 m)   Wt (!) 305 lb 9.6 oz (138.6 kg)   LMP 01/18/2019   SpO2 98%   BMI 49.33 kg/m  Vitals with BMI 06/10/2019 06/05/2019 05/22/2019  Height 5\' 6"  - -  Weight 305 lbs 10 oz 299 lbs 3 oz -  BMI 49.35 48.32 -  Systolic 142 131 07/22/2019  Diastolic 76 91 83  Pulse 76 91 105     Physical Exam  She looks systemically well.  She has probably gained appropriately weight due to the pregnancy.  Blood pressure slightly elevated compared to last time.  This will be monitored by OB/GYN.     Assessment   1. Hypothyroidism, unspecified type       Tests ordered Orders Placed This Encounter  Procedures  . T3, free     Plan: 1. I will check a free T3 to make sure the NP thyroid dose increase is appropriate.  I think it will be. 2. Follow-up in 3 months.   No orders of the defined types were placed in this encounter.   Doree Albee, MD

## 2019-07-02 ENCOUNTER — Other Ambulatory Visit: Payer: Self-pay | Admitting: Advanced Practice Midwife

## 2019-07-02 DIAGNOSIS — IMO0002 Reserved for concepts with insufficient information to code with codable children: Secondary | ICD-10-CM

## 2019-07-03 ENCOUNTER — Other Ambulatory Visit: Payer: Self-pay

## 2019-07-03 ENCOUNTER — Ambulatory Visit (INDEPENDENT_AMBULATORY_CARE_PROVIDER_SITE_OTHER): Payer: 59 | Admitting: Advanced Practice Midwife

## 2019-07-03 ENCOUNTER — Encounter: Payer: Self-pay | Admitting: Advanced Practice Midwife

## 2019-07-03 ENCOUNTER — Ambulatory Visit (INDEPENDENT_AMBULATORY_CARE_PROVIDER_SITE_OTHER): Payer: 59

## 2019-07-03 VITALS — BP 138/86 | HR 100 | Wt 309.0 lb

## 2019-07-03 DIAGNOSIS — IMO0002 Reserved for concepts with insufficient information to code with codable children: Secondary | ICD-10-CM

## 2019-07-03 DIAGNOSIS — Z3480 Encounter for supervision of other normal pregnancy, unspecified trimester: Secondary | ICD-10-CM

## 2019-07-03 DIAGNOSIS — E039 Hypothyroidism, unspecified: Secondary | ICD-10-CM

## 2019-07-03 DIAGNOSIS — Z3A23 23 weeks gestation of pregnancy: Secondary | ICD-10-CM | POA: Diagnosis not present

## 2019-07-03 DIAGNOSIS — Z0489 Encounter for examination and observation for other specified reasons: Secondary | ICD-10-CM | POA: Diagnosis not present

## 2019-07-03 DIAGNOSIS — M5431 Sciatica, right side: Secondary | ICD-10-CM

## 2019-07-03 DIAGNOSIS — O99282 Endocrine, nutritional and metabolic diseases complicating pregnancy, second trimester: Secondary | ICD-10-CM

## 2019-07-03 DIAGNOSIS — Z1389 Encounter for screening for other disorder: Secondary | ICD-10-CM

## 2019-07-03 DIAGNOSIS — Z3482 Encounter for supervision of other normal pregnancy, second trimester: Secondary | ICD-10-CM | POA: Insufficient documentation

## 2019-07-03 DIAGNOSIS — Z331 Pregnant state, incidental: Secondary | ICD-10-CM

## 2019-07-03 DIAGNOSIS — O26892 Other specified pregnancy related conditions, second trimester: Secondary | ICD-10-CM

## 2019-07-03 LAB — POCT URINALYSIS DIPSTICK OB
Glucose, UA: NEGATIVE
Leukocytes, UA: NEGATIVE
Nitrite, UA: NEGATIVE
POC,PROTEIN,UA: NEGATIVE

## 2019-07-03 NOTE — Progress Notes (Signed)
[place] 23+5 wks,frank breech,anterior placenta gr 0,normal left ovary,right ovary not visualized,svp of fluid 5.5 cm,cx 3.1 cm,fhr 148 bpm,EFW 668 g 62%,anatomy of the heart complete,limited ultrasound because of fetal position and pt body habitus,no obvious abnormalities

## 2019-07-03 NOTE — Patient Instructions (Signed)
Cheyenne Moore, I greatly value your feedback.  If you receive a survey following your visit with [place] today, we appreciate you taking the time to fill it out.  Thanks, Philipp Deputy, CNM   You will have your sugar test next visit.  Please do not eat or drink anything after midnight the night before you come, not even water.  You will be here for at least two hours.  Please make an appointment online for the bloodwork at [email or web address] for 8:30am (or as close to this as possible). Make sure you select the [facility] service center. The day of the appointment, check in with our office first, then you will go to [facility] to start the sugar test.    [facility] HAS MOVED!!! It is now [facility] at [facility] ([address] [place], [place] [address]) Entrance C, located off of [address] valet parking  Go to [email or web address] to register for FREE online childbirth classes   Call the office [phone]) or go to [facility] if:  You begin to have strong, frequent contractions  Your water breaks.  Sometimes it is a big gush of fluid, sometimes it is just a trickle that keeps getting your panties wet or running down your legs  You have vaginal bleeding.  It is normal to have a small amount of spotting if your cervix was checked.   You don't feel your baby moving like normal.  If you don't, get you something to eat and drink and lay down and focus on feeling your baby move.   If your baby is still not moving like normal, you should call the office or go to [facility]  [place] Pediatricians/Family Doctors:  [facility] [phone]            [facility] [phone]                 [facility] [phone] (usually not accepting new patients unless you have family there already, you are always welcome to call and ask)       [facility] Department [phone]       [facility] Pediatricians/Family Doctors:    [facility]: [phone]  [facility]: [phone]  [facility]: [phone]  [facility] Doctors:   [facility]: [phone]   [facility]: [phone]  [facility] Doctors:  [facility]: [phone]   Home Blood Pressure Monitoring for Patients   Your provider has recommended that you check your blood pressure (BP) at least once a week at home. If you do not have a blood pressure cuff at home, one will be provided for you. Contact your provider if you have not received your monitor within 1 week.   Helpful Tips for Accurate Home Blood Pressure Checks  . Don't smoke, exercise, or drink caffeine 30 minutes before checking your BP . Use the restroom before checking your BP (a full bladder can raise your pressure) . Relax in a comfortable upright chair . Feet on the ground . Left arm resting comfortably on a flat surface at the level of your heart . Legs uncrossed . Back supported . Sit quietly and don't talk . Place the cuff on your bare arm . Adjust snuggly, so that only two fingertips can fit between your skin and the top of the cuff . Check 2 readings separated by at least one minute . Keep a log of your BP  readings . For a visual, please reference this diagram: http://ccnc.care/bpdiagram  Provider Name: [facility]     Phone: [phone]  Zone 1: ALL CLEAR  Continue to monitor your symptoms:  . BP reading is less than 140 (top number) or less than 90 (bottom number)  . No right upper stomach pain . No headaches or seeing spots . No feeling nauseated or throwing up . No swelling in face and hands  Zone 2: CAUTION Call your doctor's office for any of the following:  . BP reading is greater than 140 (top number) or greater than 90 (bottom number)  . Stomach pain under your ribs in the middle or right side . Headaches or seeing spots . Feeling  nauseated or throwing up . Swelling in face and hands  Zone 3: EMERGENCY  Seek immediate medical care if you have any of the following:  . BP reading is greater than160 (top number) or greater than 110 (bottom number) . Severe headaches not improving with Tylenol . Serious difficulty catching your breath . Any worsening symptoms from Zone 2   Second Trimester of Pregnancy The second trimester is from week 13 through week 28, months 4 through 6. The second trimester is often a time when you feel your best. Your body has also adjusted to being pregnant, and you begin to feel better physically. Usually, morning sickness has lessened or quit completely, you may have more energy, and you may have an increase in appetite. The second trimester is also a time when the fetus is growing rapidly. At the end of the sixth month, the fetus is about 9 inches long and weighs about 1 pounds. You will likely begin to feel the baby move (quickening) between 18 and 20 weeks of the pregnancy. BODY CHANGES Your body goes through many changes during pregnancy. The changes vary from woman to woman.   Your weight will continue to increase. You will notice your lower abdomen bulging out.  You may begin to get stretch marks on your hips, abdomen, and breasts.  You may develop headaches that can be relieved by medicines approved by your health care provider.  You may urinate more often because the fetus is pressing on your bladder.  You may develop or continue to have heartburn as a result of your pregnancy.  You may develop constipation because certain hormones are causing the muscles that push waste through your intestines to slow down.  You may develop hemorrhoids or swollen, bulging veins (varicose veins).  You may have back pain because of the weight gain and pregnancy hormones relaxing your joints between the bones in your pelvis and as a result of a shift in weight and the muscles that support your  balance.  Your breasts will continue to grow and be tender.  Your gums may bleed and may be sensitive to brushing and flossing.  Dark spots or blotches (chloasma, mask of pregnancy) may develop on your face. This will likely fade after the baby is born.  A dark line from your belly button to the pubic area (linea nigra) may appear. This will likely fade after the baby is born.  You may have changes in your hair. These can include thickening of your hair, rapid growth, and changes in texture. Some women also have hair loss during or after pregnancy, or hair that feels dry or thin. Your hair will most likely return to normal after your baby is born. WHAT TO EXPECT AT YOUR PRENATAL VISITS During  a routine prenatal visit:  You will be weighed to make sure you and the fetus are growing normally.  Your blood pressure will be taken.  Your abdomen will be measured to track your baby's growth.  The fetal heartbeat will be listened to.  Any test results from the previous visit will be discussed. Your health care provider may ask you:  How you are feeling.  If you are feeling the baby move.  If you have had any abnormal symptoms, such as leaking fluid, bleeding, severe headaches, or abdominal cramping.  If you have any questions. Other tests that may be performed during your second trimester include:  Blood tests that check for:  Low iron levels (anemia).  Gestational diabetes (between 24 and 28 weeks).  Rh antibodies.  Urine tests to check for infections, diabetes, or protein in the urine.  An ultrasound to confirm the proper growth and development of the baby.  An amniocentesis to check for possible genetic problems.  Fetal screens for spina bifida and Down syndrome. HOME CARE INSTRUCTIONS   Avoid all smoking, herbs, alcohol, and unprescribed drugs. These chemicals affect the formation and growth of the baby.  Follow your health care provider's instructions regarding  medicine use. There are medicines that are either safe or unsafe to take during pregnancy.  Exercise only as directed by your health care provider. Experiencing uterine cramps is a good sign to stop exercising.  Continue to eat regular, healthy meals.  Wear a good support bra for breast tenderness.  Do not use hot tubs, steam rooms, or saunas.  Wear your seat belt at all times when driving.  Avoid raw meat, uncooked cheese, cat litter boxes, and soil used by cats. These carry germs that can cause birth defects in the baby.  Take your prenatal vitamins.  Try taking a stool softener (if your health care provider approves) if you develop constipation. Eat more high-fiber foods, such as fresh vegetables or fruit and whole grains. Drink plenty of fluids to keep your urine clear or pale yellow.  Take warm sitz baths to soothe any pain or discomfort caused by hemorrhoids. Use hemorrhoid cream if your health care provider approves.  If you develop varicose veins, wear support hose. Elevate your feet for 15 minutes, 3-4 times a day. Limit salt in your diet.  Avoid heavy lifting, wear low heel shoes, and practice good posture.  Rest with your legs elevated if you have leg cramps or low back pain.  Visit your dentist if you have not gone yet during your pregnancy. Use a soft toothbrush to brush your teeth and be gentle when you floss.  A sexual relationship may be continued unless your health care provider directs you otherwise.  Continue to go to all your prenatal visits as directed by your health care provider. SEEK MEDICAL CARE IF:   You have dizziness.  You have mild pelvic cramps, pelvic pressure, or nagging pain in the abdominal area.  You have persistent nausea, vomiting, or diarrhea.  You have a bad smelling vaginal discharge.  You have pain with urination. SEEK IMMEDIATE MEDICAL CARE IF:   You have a fever.  You are leaking fluid from your vagina.  You have spotting or  bleeding from your vagina.  You have severe abdominal cramping or pain.  You have rapid weight gain or loss.  You have shortness of breath with chest pain.  You notice sudden or extreme swelling of your face, hands, ankles, feet, or legs.  You  have not felt your baby move in over an hour.  You have severe headaches that do not go away with medicine.  You have vision changes. Document Released: 12/28/2000 Document Revised: 01/08/2013 Document Reviewed: 03/06/2012 [facility] Patient Information 2015 [place], [place]. This information is not intended to replace advice given to you by your health care provider. Make sure you discuss any questions you have with your health care provider.

## 2019-07-03 NOTE — Progress Notes (Signed)
LOW-RISK PREGNANCY VISIT Patient name: Cheyenne Moore MRN [id number]  Date of birth: 29-Apr-1990 Chief Complaint:   Routine Prenatal Visit ([place] today)  History of Present Illness:   Cheyenne Moore is a 29 y.o. G7P0010 female at [redacted]w[redacted]d with an Estimated Date of Delivery: 10/25/19 being seen today for ongoing management of a low-risk pregnancy.   Today she reports some BLE edema; R sciatic pain and would like to see chioropractor. Contractions: Not present. Vag. Bleeding: None.  Movement: Present. denies leaking of fluid. Review of Systems:   Pertinent items are noted in HPI Denies abnormal vaginal discharge w/ itching/odor/irritation, headaches, visual changes, shortness of breath, chest pain, abdominal pain, severe nausea/vomiting, or problems with urination or bowel movements unless otherwise stated above. Pertinent History Reviewed:  Reviewed past medical,surgical, social, obstetrical and family history.  Reviewed problem list, medications and allergies. Physical Assessment:   Vitals:   07/03/19 0915  BP: 138/86  Pulse: 100  Weight: (!) 309 lb (140.2 kg)  Body mass index is 49.87 kg/m.        Physical Examination:   General appearance: Well appearing, and in no distress  Mental status: Alert, oriented to person, place, and time  Skin: Warm & dry  Cardiovascular: Normal heart rate noted  Respiratory: Normal respiratory effort, no distress  Abdomen: Soft, gravid, nontender  Pelvic: Cervical exam deferred         Extremities: Edema: Trace  Fetal Status: Fetal Heart Rate (bpm): 148 u/s   Movement: Present     F/U heart anatomy: [place] 23+5 wks,frank breech,anterior placenta gr 0,normal left ovary,right ovary not visualized,svp of fluid 5.5 cm,cx 3.1 cm,fhr 148 bpm,EFW 668 g 62%,anatomy of the heart complete,limited ultrasound because of fetal position and pt body habitus,no obvious abnormalities   Results for orders placed or performed in visit on 07/03/19 (from the past 24 hour(s))   POC Urinalysis Dipstick OB   Collection Time: 07/03/19  9:16 AM  Result Value Ref Range   Color, UA     Clarity, UA     Glucose, UA Negative Negative   Bilirubin, UA     Ketones, UA trace    Spec Grav, UA     Blood, UA trace    pH, UA     POC,PROTEIN,UA Negative Negative, Trace, Small (1+), Moderate (2+), Large (3+), 4+   Urobilinogen, UA     Nitrite, UA neg    Leukocytes, UA Negative Negative   Appearance     Odor      Assessment & Plan:  1) Low-risk pregnancy G2P0010 at [redacted]w[redacted]d with an Estimated Date of Delivery: 10/25/19   2) Hypothyroid, stable on Armour 120mg   3) Borderline BPs, checking 1-2x/day at home; had 2 values >140/90 that on immediate repeat were 130/70s; to notify if remains >140/90 and she is aware that BP is likely to be an issue this preg; taking BASA  4) R leg sciatic pain, wants to see chiropractor> make sure he/she is comfortable tx women in pregnancy   Meds: No orders of the defined types were placed in this encounter.  Labs/procedures today: f/u heart anatomy u/s   Plan:  Continue routine obstetrical care   Reviewed: Preterm labor symptoms and general obstetric precautions including but not limited to vaginal bleeding, contractions, leaking of fluid and fetal movement were reviewed in detail with the patient.  All questions were answered. Has home bp cuff. Check bp weekly, let us know if >140/90.   Follow-up: Return in about  3 weeks (around 07/24/2019) for LROB, PN2.  Orders Placed This Encounter  Procedures  . POC Urinalysis Dipstick OB   Myrtis Ser [facility] 07/03/2019 9:35 AM

## 2019-07-05 MED FILL — NP THYROID 120 MG TABLET: 120 | 30 days supply | Qty: 30 | Fill #1

## 2019-07-05 MED FILL — OMEPRAZOLE DR 20 MG CAPSULE: 20 | 30 days supply | Qty: 30 | Fill #0

## 2019-07-24 ENCOUNTER — Other Ambulatory Visit: Payer: Self-pay

## 2019-07-24 ENCOUNTER — Other Ambulatory Visit: Payer: Medicaid Other

## 2019-07-24 ENCOUNTER — Ambulatory Visit (INDEPENDENT_AMBULATORY_CARE_PROVIDER_SITE_OTHER): Payer: Medicaid Other | Admitting: Women's Health

## 2019-07-24 ENCOUNTER — Encounter: Payer: Self-pay | Admitting: Women's Health

## 2019-07-24 VITALS — BP 143/100 | HR 96

## 2019-07-24 DIAGNOSIS — Z331 Pregnant state, incidental: Secondary | ICD-10-CM

## 2019-07-24 DIAGNOSIS — I10 Essential (primary) hypertension: Secondary | ICD-10-CM | POA: Insufficient documentation

## 2019-07-24 DIAGNOSIS — O99282 Endocrine, nutritional and metabolic diseases complicating pregnancy, second trimester: Secondary | ICD-10-CM

## 2019-07-24 DIAGNOSIS — O0990 Supervision of high risk pregnancy, unspecified, unspecified trimester: Secondary | ICD-10-CM

## 2019-07-24 DIAGNOSIS — O10919 Unspecified pre-existing hypertension complicating pregnancy, unspecified trimester: Secondary | ICD-10-CM

## 2019-07-24 DIAGNOSIS — E039 Hypothyroidism, unspecified: Secondary | ICD-10-CM

## 2019-07-24 DIAGNOSIS — Z3A26 26 weeks gestation of pregnancy: Secondary | ICD-10-CM

## 2019-07-24 DIAGNOSIS — O10912 Unspecified pre-existing hypertension complicating pregnancy, second trimester: Secondary | ICD-10-CM

## 2019-07-24 DIAGNOSIS — Z3480 Encounter for supervision of other normal pregnancy, unspecified trimester: Secondary | ICD-10-CM

## 2019-07-24 DIAGNOSIS — Z1389 Encounter for screening for other disorder: Secondary | ICD-10-CM

## 2019-07-24 DIAGNOSIS — Z23 Encounter for immunization: Secondary | ICD-10-CM

## 2019-07-24 DIAGNOSIS — O0992 Supervision of high risk pregnancy, unspecified, second trimester: Secondary | ICD-10-CM

## 2019-07-24 DIAGNOSIS — Z131 Encounter for screening for diabetes mellitus: Secondary | ICD-10-CM

## 2019-07-24 LAB — POCT URINALYSIS DIPSTICK OB
Blood, UA: NEGATIVE
Glucose, UA: NEGATIVE
Ketones, UA: NEGATIVE
Leukocytes, UA: NEGATIVE
Nitrite, UA: NEGATIVE
POC,PROTEIN,UA: NEGATIVE

## 2019-07-24 MED ORDER — LABETALOL HCL 200 MG PO TABS
200.0000 mg | ORAL_TABLET | Freq: Two times a day (BID) | ORAL | 3 refills | Status: DC
Start: 2019-07-24 — End: 2019-10-23

## 2019-07-24 NOTE — Progress Notes (Signed)
HIGH-RISK PREGNANCY VISIT Patient name: Cheyenne Moore MRN [id number]  Date of birth: 07-Jun-1990 Chief Complaint:   Routine Prenatal Visit (PN2)  History of Present Illness:   Cheyenne Moore is a 29 y.o. G4P0010 female at [redacted]w[redacted]d with an Estimated Date of Delivery: 10/25/19 being seen today for ongoing management of a high-risk pregnancy complicated by chronic hypertension currently on no meds.  Officially dx today.  BP borderline or mildly elevated every visit. Highest reading today. Has been checking at home and is labile, 120s-140s/80s-90s. Today she reports no complaints. Denies visual changes, ruq/epigastric pain, n/v.  Has had headaches throughout pregnancy, no recent change. Occ has to take apap to help. Has been on ASA 162mg  since 15wks.  Depression screen [facility] 2/9 07/24/2019 04/11/2019 05/03/2018 10/31/2017  Decreased Interest 0 0 0 0  Down, Depressed, Hopeless 0 0 0 0  PHQ - 2 Score 0 0 0 0  Altered sleeping 0 0 - -  Tired, decreased energy 1 0 - -  Change in appetite 0 0 - -  Feeling bad or failure about yourself  0 0 - -  Trouble concentrating 0 0 - -  Moving slowly or fidgety/restless 0 0 - -  Suicidal thoughts 0 0 - -  PHQ-9 Score 1 0 - -   Contractions: Not present. Vag. Bleeding: None.  Movement: Present. denies leaking of fluid.  Review of Systems:   Pertinent items are noted in HPI Denies abnormal vaginal discharge w/ itching/odor/irritation, headaches, visual changes, shortness of breath, chest pain, abdominal pain, severe nausea/vomiting, or problems with urination or bowel movements unless otherwise stated above. Pertinent History Reviewed:  Reviewed past medical,surgical, social, obstetrical and family history.  Reviewed problem list, medications and allergies. Physical Assessment:   Vitals:   07/24/19 0909 07/24/19 0911  BP: (!) 145/92 (!) 143/100  Pulse: (!) 103 96  There is no height or weight on file to calculate BMI.           Physical Examination:    General appearance: alert, well appearing, and in no distress  Mental status: alert, oriented to person, place, and time  Skin: warm & dry   Extremities: Edema: Trace    Cardiovascular: normal heart rate noted  Respiratory: normal respiratory effort, no distress  Abdomen: gravid, soft, non-tender  Pelvic: Cervical exam deferred         Fetal Status: Fetal Heart Rate (bpm): 140 Fundal Height: 28 cm Movement: Present    Fetal Surveillance Testing today: doppler   Chaperone: n/a    Results for orders placed or performed in visit on 07/24/19 (from the past 24 hour(s))  POC Urinalysis Dipstick OB   Collection Time: 07/24/19  9:19 AM  Result Value Ref Range   Color, UA     Clarity, UA     Glucose, UA Negative Negative   Bilirubin, UA     Ketones, UA neg    Spec Grav, UA     Blood, UA neg    pH, UA     POC,PROTEIN,UA Negative Negative, Trace, Small (1+), Moderate (2+), Large (3+), 4+   Urobilinogen, UA     Nitrite, UA neg    Leukocytes, UA Negative Negative   Appearance     Odor      Assessment & Plan:  1) High-risk pregnancy G2P0010 at [redacted]w[redacted]d with an Estimated Date of Delivery: 10/25/19   2) CHTN, officially dx today, rx Labetalol 200mg  BID, continue ASA 162mg , get pre-e labs. Reviewed pre-e s/s,  reasons to seek care. F/U in 2d for bp check, take Labetalol at least 1-2hr prior.   3) Hypothyroidism, stable on NP Thyroid 120mg  daily, last TSH 5/19 1.440, has repeat scheduled w/ PCP in August, however her insurance changed and she's not sure it will be covered there, so we will do it here  Meds:  Meds ordered this encounter  Medications  . labetalol (NORMODYNE) 200 MG tablet    Sig: Take 1 tablet (200 mg total) by mouth 2 (two) times daily.    Dispense:  60 tablet    Refill:  3    Order Specific Question:   Supervising Provider    Answer:   September H [[id number]]    Labs/procedures today: pn2, tdap  Treatment Plan:   Growth u/s q4wks    2x/wk testing nst/sono @ 32wks      Deliver 38-39wks (37wks or prn if poor control)____   Reviewed: Preterm labor symptoms and general obstetric precautions including but not limited to vaginal bleeding, contractions, leaking of fluid and fetal movement were reviewed in detail with the patient.  All questions were answered. Has home bp cuff.  Check bp BID, bring log   Follow-up: Return for 2d bp check nurse, then asap efw u/s (no visit), then 4wk from now HROB in person w/ CNM or MD.  Orders Placed This Encounter  Procedures  . [facility] Follow Up  . Tdap vaccine greater than or equal to 7yo IM  . Comprehensive metabolic panel  . Protein / creatinine ratio, urine  . POC Urinalysis Dipstick OB   US CNM, [facility] 07/24/2019 10:34 AM

## 2019-07-24 NOTE — Patient Instructions (Addendum)
Cheyenne Moore, I greatly value your feedback.  If you receive a survey following your visit with us today, we appreciate you taking the time to fill it out.  Thanks, [name], CNM, WHNP-BC   [facility] ([address] [place], [place] [address]) Entrance C, located off of [address] Free 24/7 valet parking  Go to [email or web address] to register for FREE online childbirth classes   Call the office [phone]) or go to [facility] if:  You begin to have strong, frequent contractions  Your water breaks.  Sometimes it is a big gush of fluid, sometimes it is just a trickle that keeps getting your panties wet or running down your legs  You have vaginal bleeding.  It is normal to have a small amount of spotting if your cervix was checked.   You don't feel your baby moving like normal.  If you don't, get you something to eat and drink and lay down and focus on feeling your baby move.  You should feel at least 10 movements in 2 hours.  If you don't, you should call the office or go to [facility].    Call the office [phone]) or go to [facility] for these signs of pre-eclampsia:  Severe headache that does not go away with Tylenol  Visual changes- seeing spots, double, blurred vision  Pain under your right breast or upper abdomen that does not go away with Tums or heartburn medicine  Nausea and/or vomiting  Severe swelling in your hands, feet, and face      Tdap Vaccine  It is recommended that you get the Tdap vaccine during the third trimester of EACH pregnancy to help protect your baby from getting pertussis (whooping cough)  27-36 weeks is the BEST time to do this so that you can pass the protection on to your baby. During pregnancy is better than after pregnancy, but if you are unable to get it during pregnancy it will be offered at the hospital.   You can get this vaccine with us, at the health department, your family doctor, or  some local pharmacies  Everyone who will be around your baby should also be up-to-date on their vaccines before the baby comes. Adults (who are not pregnant) only need 1 dose of Tdap during adulthood.   [place] Pediatricians/Family Doctors:  [facility] [phone]            [facility] [phone]                 [facility] [phone] (usually not accepting new patients unless you have family there already, you are always welcome to call and ask)       [facility] [phone]       [facility]/Family Doctors:   [facility]: [phone]  [facility]: [phone]  [facility]: [phone]  [facility] Doctors:   [facility]: [phone]   [facility]: [phone]  [facility] Family Doctors:  [facility]: [phone]   Home Blood Pressure Monitoring for Patients   Your provider has recommended that you check your blood pressure (BP) at least once a week at home. If you do not have a blood pressure cuff at home, one will be provided for you. Contact your provider if you have not received your monitor within 1 week.   Helpful Tips for Accurate Home Blood Pressure Checks  . Don't smoke, exercise,  or drink caffeine 30 minutes before checking your BP . Use the restroom before checking your BP (a full bladder can raise your pressure) . Relax in a comfortable upright chair . Feet on the ground . Left arm resting comfortably on a flat surface at the level of your heart . Legs uncrossed . Back supported . Sit quietly and don't talk . Place the cuff on your bare arm . Adjust snuggly, so that only two fingertips can fit between your skin and the top of the cuff . Check 2 readings separated by at least one minute . Keep a log of your BP readings . For a visual, please reference this diagram:  http://ccnc.care/bpdiagram  Provider Name: [facility]     Phone: [phone]  Zone 1: ALL CLEAR  Continue to monitor your symptoms:  . BP reading is less than 140 (top number) or less than 90 (bottom number)  . No right upper stomach pain . No headaches or seeing spots . No feeling nauseated or throwing up . No swelling in face and hands  Zone 2: CAUTION Call your doctor's office for any of the following:  . BP reading is greater than 140 (top number) or greater than 90 (bottom number)  . Stomach pain under your ribs in the middle or right side . Headaches or seeing spots . Feeling nauseated or throwing up . Swelling in face and hands  Zone 3: EMERGENCY  Seek immediate medical care if you have any of the following:  . BP reading is greater than160 (top number) or greater than 110 (bottom number) . Severe headaches not improving with Tylenol . Serious difficulty catching your breath . Any worsening symptoms from Zone 2   Third Trimester of Pregnancy The third trimester is from week 29 through week 42, months 7 through 9. The third trimester is a time when the fetus is growing rapidly. At the end of the ninth month, the fetus is about 20 inches in length and weighs 6-10 pounds.  BODY CHANGES Your body goes through many changes during pregnancy. The changes vary from woman to woman.   Your weight will continue to increase. You can expect to gain 25-35 pounds (11-16 kg) by the end of the pregnancy.  You may begin to get stretch marks on your hips, abdomen, and breasts.  You may urinate more often because the fetus is moving lower into your pelvis and pressing on your bladder.  You may develop or continue to have heartburn as a result of your pregnancy.  You may develop constipation because certain hormones are causing the muscles that push waste through your intestines to slow down.  You may develop hemorrhoids or swollen, bulging veins (varicose veins).  You may have  pelvic pain because of the weight gain and pregnancy hormones relaxing your joints between the bones in your pelvis. Backaches may result from overexertion of the muscles supporting your posture.  You may have changes in your hair. These can include thickening of your hair, rapid growth, and changes in texture. Some women also have hair loss during or after pregnancy, or hair that feels dry or thin. Your hair will most likely return to normal after your baby is born.  Your breasts will continue to grow and be tender. A yellow discharge may leak from your breasts called colostrum.  Your belly button may stick out.  You may feel short of breath because of your expanding uterus.  You may notice the fetus "dropping," or  moving lower in your abdomen.  You may have a bloody mucus discharge. This usually occurs a few days to a week before labor begins.  Your cervix becomes thin and soft (effaced) near your due date. WHAT TO EXPECT AT YOUR PRENATAL EXAMS  You will have prenatal exams every 2 weeks until week 36. Then, you will have weekly prenatal exams. During a routine prenatal visit:  You will be weighed to make sure you and the fetus are growing normally.  Your blood pressure is taken.  Your abdomen will be measured to track your baby's growth.  The fetal heartbeat will be listened to.  Any test results from the previous visit will be discussed.  You may have a cervical check near your due date to see if you have effaced. At around 36 weeks, your caregiver will check your cervix. At the same time, your caregiver will also perform a test on the secretions of the vaginal tissue. This test is to determine if a type of bacteria, Group B streptococcus, is present. Your caregiver will explain this further. Your caregiver may ask you:  What your birth plan is.  How you are feeling.  If you are feeling the baby move.  If you have had any abnormal symptoms, such as leaking fluid, bleeding,  severe headaches, or abdominal cramping.  If you have any questions. Other tests or screenings that may be performed during your third trimester include:  Blood tests that check for low iron levels (anemia).  Fetal testing to check the health, activity level, and growth of the fetus. Testing is done if you have certain medical conditions or if there are problems during the pregnancy. FALSE LABOR You may feel small, irregular contractions that eventually go away. These are called Braxton Hicks contractions, or false labor. Contractions may last for hours, days, or even weeks before true labor sets in. If contractions come at regular intervals, intensify, or become painful, it is best to be seen by your caregiver.  SIGNS OF LABOR   Menstrual-like cramps.  Contractions that are 5 minutes apart or less.  Contractions that start on the top of the uterus and spread down to the lower abdomen and back.  A sense of increased pelvic pressure or back pain.  A watery or bloody mucus discharge that comes from the vagina. If you have any of these signs before the 37th week of pregnancy, call your caregiver right away. You need to go to the hospital to get checked immediately. HOME CARE INSTRUCTIONS   Avoid all smoking, herbs, alcohol, and unprescribed drugs. These chemicals affect the formation and growth of the baby.  Follow your caregiver's instructions regarding medicine use. There are medicines that are either safe or unsafe to take during pregnancy.  Exercise only as directed by your caregiver. Experiencing uterine cramps is a good sign to stop exercising.  Continue to eat regular, healthy meals.  Wear a good support bra for breast tenderness.  Do not use hot tubs, steam rooms, or saunas.  Wear your seat belt at all times when driving.  Avoid raw meat, uncooked cheese, cat litter boxes, and soil used by cats. These carry germs that can cause birth defects in the baby.  Take your  prenatal vitamins.  Try taking a stool softener (if your caregiver approves) if you develop constipation. Eat more high-fiber foods, such as fresh vegetables or fruit and whole grains. Drink plenty of fluids to keep your urine clear or pale yellow.  Take warm  sitz baths to soothe any pain or discomfort caused by hemorrhoids. Use hemorrhoid cream if your caregiver approves.  If you develop varicose veins, wear support hose. Elevate your feet for 15 minutes, 3-4 times a day. Limit salt in your diet.  Avoid heavy lifting, wear low heal shoes, and practice good posture.  Rest a lot with your legs elevated if you have leg cramps or low back pain.  Visit your dentist if you have not gone during your pregnancy. Use a soft toothbrush to brush your teeth and be gentle when you floss.  A sexual relationship may be continued unless your caregiver directs you otherwise.  Do not travel far distances unless it is absolutely necessary and only with the approval of your caregiver.  Take prenatal classes to understand, practice, and ask questions about the labor and delivery.  Make a trial run to the hospital.  Pack your hospital bag.  Prepare the baby's nursery.  Continue to go to all your prenatal visits as directed by your caregiver. SEEK MEDICAL CARE IF:  You are unsure if you are in labor or if your water has broken.  You have dizziness.  You have mild pelvic cramps, pelvic pressure, or nagging pain in your abdominal area.  You have persistent nausea, vomiting, or diarrhea.  You have a bad smelling vaginal discharge.  You have pain with urination. SEEK IMMEDIATE MEDICAL CARE IF:   You have a fever.  You are leaking fluid from your vagina.  You have spotting or bleeding from your vagina.  You have severe abdominal cramping or pain.  You have rapid weight loss or gain.  You have shortness of breath with chest pain.  You notice sudden or extreme swelling of your face, hands,  ankles, feet, or legs.  You have not felt your baby move in over an hour.  You have severe headaches that do not go away with medicine.  You have vision changes. Document Released: 12/28/2000 Document Revised: 01/08/2013 Document Reviewed: 03/06/2012 [facility] Patient Information 2015 [place], [place]. This information is not intended to replace advice given to you by your health care provider. Make sure you discuss any questions you have with your health care provider.   Hypertension During Pregnancy Hypertension is also called high blood pressure. High blood pressure means that the force of your blood moving in your body is too strong. It can cause problems for you and your baby. Different types of high blood pressure can happen during pregnancy. The types are:  High blood pressure before you got pregnant. This is called chronic hypertension.  This can continue during your pregnancy. Your doctor will want to keep checking your blood pressure. You may need medicine to keep your blood pressure under control while you are pregnant. You will need follow-up visits after you have your baby.  High blood pressure that goes up during pregnancy when it was normal before. This is called gestational hypertension. It will usually get better after you have your baby, but your doctor will need to watch your blood pressure to make sure that it is getting better.  Very high blood pressure during pregnancy. This is called preeclampsia. Very high blood pressure is an emergency that needs to be checked and treated right away.  You may develop very high blood pressure after giving birth. This is called postpartum preeclampsia. This usually occurs within 48 hours after childbirth but may occur up to 6 weeks after giving birth. This is rare. How does this affect me?  If you have high blood pressure during pregnancy, you have a higher chance of developing high blood pressure:  As you get older.  If you get pregnant  again. In some cases, high blood pressure during pregnancy can cause:  Stroke.  Heart attack.  Damage to the kidneys, lungs, or liver.  Preeclampsia.  Jerky movements you cannot control (convulsions or seizures).  Problems with the placenta. How does this affect my baby? Your baby may:  Be born early.  Not weigh as much as he or she should.  Not handle labor well, leading to a c-section birth. What are the risks?  Having high blood pressure during a past pregnancy.  Being overweight.  Being 89 years old or older.  Being pregnant for the first time.  Being pregnant with more than one baby.  Becoming pregnant using fertility methods, such as IVF.  Having other problems, such as diabetes, or kidney disease.  Having family members who have high blood pressure. What can I do to lower my risk?   Keep a healthy weight.  Eat a healthy diet.  Follow what your doctor tells you about treating any medical problems that you had before becoming pregnant. It is very important to go to all of your doctor visits. Your doctor will check your blood pressure and make sure that your pregnancy is progressing as it should. Treatment should start early if a problem is found. How is this treated? Treatment for high blood pressure during pregnancy can differ depending on the type of high blood pressure you have and how serious it is.  You may need to take blood pressure medicine.  If you have been taking medicine for your blood pressure, you may need to change the medicine during pregnancy if it is not safe for your baby.  If your doctor thinks that you could get very high blood pressure, he or she may tell you to take a low-dose aspirin during your pregnancy.  If you have very high blood pressure, you may need to stay in the hospital so you and your baby can be watched closely. You may also need to take medicine to lower your blood pressure. This medicine may be given by mouth or  through an IV tube.  In some cases, if your condition gets worse, you may need to have your baby early. Follow these instructions at home: Eating and drinking   Drink enough fluid to keep your pee (urine) pale yellow.  Avoid caffeine. Lifestyle  Do not use any products that contain nicotine or tobacco, such as cigarettes, e-cigarettes, and chewing tobacco. If you need help quitting, ask your doctor.  Do not use alcohol or drugs.  Avoid stress.  Rest and get plenty of sleep.  Regular exercise can help. Ask your doctor what kinds of exercise are best for you. General instructions  Take over-the-counter and prescription medicines only as told by your doctor.  Keep all prenatal and follow-up visits as told by your doctor. This is important. Contact a doctor if:  You have symptoms that your doctor told you to watch for, such as: ? Headaches. ? Nausea. ? Vomiting. ? Belly (abdominal) pain. ? Dizziness. ? Light-headedness. Get help right away if:  You have: ? Very bad belly pain that does not get better with treatment. ? A very bad headache that does not get better. ? Vomiting that does not get better. ? Sudden, fast weight gain. ? Sudden swelling in your hands, ankles, or face. ? Bleeding  from your vagina. ? Blood in your pee. ? Blurry vision. ? Double vision. ? Shortness of breath. ? Chest pain. ? Weakness on one side of your body. ? Trouble talking.  Your baby is not moving as much as usual. Summary  High blood pressure is also called hypertension.  High blood pressure means that the force of your blood moving in your body is too strong.  High blood pressure can cause problems for you and your baby.  Keep all follow-up visits as told by your doctor. This is important. This information is not intended to replace advice given to you by your health care provider. Make sure you discuss any questions you have with your health care provider. Document Revised:  04/26/2018 Document Reviewed: 01/30/2018 Elsevier Patient Education  2020 [employer].

## 2019-07-25 LAB — GLUCOSE TOLERANCE, 2 HOURS W/ 1HR
Glucose, 1 hour: 180 mg/dL — ABNORMAL HIGH (ref 65–179)
Glucose, 2 hour: 161 mg/dL — ABNORMAL HIGH (ref 65–152)
Glucose, Fasting: 103 mg/dL — ABNORMAL HIGH (ref 65–91)

## 2019-07-25 LAB — COMPREHENSIVE METABOLIC PANEL
ALT: 12 IU/L (ref 0–32)
AST: 12 IU/L (ref 0–40)
Albumin/Globulin Ratio: 1.5 (ref 1.2–2.2)
Albumin: 3.4 g/dL — ABNORMAL LOW (ref 3.9–5.0)
Alkaline Phosphatase: 91 IU/L (ref 48–121)
BUN/Creatinine Ratio: 15 (ref 9–23)
BUN: 6 mg/dL (ref 6–20)
Bilirubin Total: 0.2 mg/dL (ref 0.0–1.2)
CO2: 19 mmol/L — ABNORMAL LOW (ref 20–29)
Calcium: 9 mg/dL (ref 8.7–10.2)
Chloride: 105 mmol/L (ref 96–106)
Creatinine, Ser: 0.39 mg/dL — ABNORMAL LOW (ref 0.57–1.00)
GFR calc Af Amer: 166 mL/min/{1.73_m2} (ref >59)
GFR calc non Af Amer: 144 mL/min/{1.73_m2} (ref >59)
Globulin, Total: 2.3 g/dL (ref 1.5–4.5)
Glucose: 158 mg/dL — ABNORMAL HIGH (ref 65–99)
Potassium: 3.5 mmol/L (ref 3.5–5.2)
Sodium: 137 mmol/L (ref 134–144)
Total Protein: 5.7 g/dL — ABNORMAL LOW (ref 6.0–8.5)

## 2019-07-25 LAB — PROTEIN / CREATININE RATIO, URINE
Creatinine, Urine: 82 mg/dL
Protein, Ur: 8.9 mg/dL
Protein/Creat Ratio: 109 mg/g creat (ref 0–200)

## 2019-07-25 LAB — CBC
Hematocrit: 35.9 % (ref 34.0–46.6)
Hemoglobin: 11.6 g/dL (ref 11.1–15.9)
MCH: 29 pg (ref 26.6–33.0)
MCHC: 32.3 g/dL (ref 31.5–35.7)
MCV: 90 fL (ref 79–97)
Platelets: 264 10*3/uL (ref 150–450)
RBC: 4 x10E6/uL (ref 3.77–5.28)
RDW: 13.1 % (ref 11.7–15.4)
WBC: 11.4 10*3/uL — ABNORMAL HIGH (ref 3.4–10.8)

## 2019-07-25 LAB — RPR: RPR Ser Ql: NONREACTIVE

## 2019-07-25 LAB — HIV ANTIBODY (ROUTINE TESTING W REFLEX): HIV Screen 4th Generation wRfx: NONREACTIVE

## 2019-07-25 LAB — ANTIBODY SCREEN: Antibody Screen: NEGATIVE

## 2019-07-26 ENCOUNTER — Other Ambulatory Visit: Payer: Self-pay | Admitting: *Deleted

## 2019-07-26 ENCOUNTER — Ambulatory Visit (INDEPENDENT_AMBULATORY_CARE_PROVIDER_SITE_OTHER): Payer: Medicaid Other

## 2019-07-26 ENCOUNTER — Other Ambulatory Visit: Payer: Self-pay | Admitting: Women's Health

## 2019-07-26 ENCOUNTER — Ambulatory Visit (INDEPENDENT_AMBULATORY_CARE_PROVIDER_SITE_OTHER): Payer: Medicaid Other | Admitting: *Deleted

## 2019-07-26 VITALS — BP 139/84 | HR 86 | Wt 314.2 lb

## 2019-07-26 DIAGNOSIS — Z1389 Encounter for screening for other disorder: Secondary | ICD-10-CM

## 2019-07-26 DIAGNOSIS — O0992 Supervision of high risk pregnancy, unspecified, second trimester: Secondary | ICD-10-CM | POA: Diagnosis not present

## 2019-07-26 DIAGNOSIS — O24419 Gestational diabetes mellitus in pregnancy, unspecified control: Secondary | ICD-10-CM | POA: Insufficient documentation

## 2019-07-26 DIAGNOSIS — O10912 Unspecified pre-existing hypertension complicating pregnancy, second trimester: Secondary | ICD-10-CM | POA: Diagnosis not present

## 2019-07-26 DIAGNOSIS — O0990 Supervision of high risk pregnancy, unspecified, unspecified trimester: Secondary | ICD-10-CM

## 2019-07-26 DIAGNOSIS — Z3A27 27 weeks gestation of pregnancy: Secondary | ICD-10-CM | POA: Diagnosis not present

## 2019-07-26 DIAGNOSIS — O10919 Unspecified pre-existing hypertension complicating pregnancy, unspecified trimester: Secondary | ICD-10-CM

## 2019-07-26 DIAGNOSIS — O162 Unspecified maternal hypertension, second trimester: Secondary | ICD-10-CM

## 2019-07-26 DIAGNOSIS — O24410 Gestational diabetes mellitus in pregnancy, diet controlled: Secondary | ICD-10-CM

## 2019-07-26 DIAGNOSIS — Z8632 Personal history of gestational diabetes: Secondary | ICD-10-CM

## 2019-07-26 DIAGNOSIS — Z331 Pregnant state, incidental: Secondary | ICD-10-CM

## 2019-07-26 DIAGNOSIS — Z0130 Encounter for examination of blood pressure without abnormal findings: Secondary | ICD-10-CM

## 2019-07-26 HISTORY — DX: Personal history of gestational diabetes: Z86.32

## 2019-07-26 LAB — POCT URINALYSIS DIPSTICK OB
Blood, UA: NEGATIVE
Glucose, UA: NEGATIVE
Ketones, UA: NEGATIVE
Leukocytes, UA: NEGATIVE
Nitrite, UA: NEGATIVE
POC,PROTEIN,UA: NEGATIVE

## 2019-07-26 MED ORDER — ACCU-CHEK GUIDE VI STRP: ORAL_STRIP | 12 refills | Status: DC

## 2019-07-26 MED ORDER — ACCU-CHEK GUIDE ME W/DEVICE KIT: 1.0000 | PACK | Freq: Four times a day (QID) | 0 refills | Status: DC

## 2019-07-26 MED ORDER — ACCU-CHEK SOFTCLIX LANCETS MISC: 12 refills | Status: DC

## 2019-07-26 NOTE — Progress Notes (Addendum)
   NURSE VISIT- BLOOD PRESSURE CHECK  SUBJECTIVE:  Cheyenne Moore is a 29 y.o. G29P0010 female here for BP check. She is [redacted]w[redacted]d pregnant    HYPERTENSION ROS:  Pregnant/postpartum:  . Severe headaches that don't go away with tylenol/other medicines: No  . Visual changes (seeing spots/double/blurred vision) No  . Severe pain under right breast breast or in center of upper chest No  . Severe nausea/vomiting No  . Taking medicines as instructed yes   OBJECTIVE:  BP 139/84   Pulse 86   Wt (!) 314 lb 3.2 oz (142.5 kg)   LMP 01/18/2019   BMI 50.71 kg/m   Appearance alert, well appearing, and in no distress.  ASSESSMENT: Pregnancy [redacted]w[redacted]d  blood pressure check  PLAN: Discussed with Joellyn Haff, CNM, [facility]   Recommendations: no changes needed   Follow-up: as scheduled   Stoney Bang  07/26/2019 12:06 PM   Chart reviewed for nurse visit. Agree with plan of care.  Cheral Marker, [place] 07/26/2019 2:14 PM

## 2019-07-26 NOTE — Progress Notes (Signed)
[place] 27 wks,frank breech,cx 3 cm,anterior placenta gr 0,afi 14 cm,fhr 144 bpm,efw 1054 g 50%

## 2019-07-31 ENCOUNTER — Ambulatory Visit: Payer: Medicaid Other

## 2019-08-14 ENCOUNTER — Encounter: Payer: Medicaid Other | Attending: Advanced Practice Midwife | Admitting: Registered"

## 2019-08-14 ENCOUNTER — Telehealth (INDEPENDENT_AMBULATORY_CARE_PROVIDER_SITE_OTHER): Payer: Self-pay

## 2019-08-14 NOTE — Telephone Encounter (Signed)
Cheyenne Moore is calling stating that [facility] on [address] has NP Thyroid 120mg  on back order and she needs an equivalent called in, please advise?

## 2019-08-15 MED ORDER — THYROID 60 MG PO TABS: 120.0000 mg | ORAL_TABLET | Freq: Every day | ORAL | 0 refills | Status: DC

## 2019-08-15 NOTE — Telephone Encounter (Signed)
I called this patient to discuss her NP thyroid dose as she is currently pregnant. She tells me that her OBGYN recommended she increase her dose to 120mg  daily. Refill was sent to her pharmacy.

## 2019-08-15 NOTE — Telephone Encounter (Signed)
I don't have computer so I'm unable to fill this. Sarah,can you please send in NP THYROID 60mg  tablets instead of 120mg  with appropriate dosing? Thanks!

## 2019-08-15 NOTE — Addendum Note (Signed)
Addended by: Elenore Paddy on: 08/15/2019 05:19 PM   Modules accepted: Orders

## 2019-08-21 ENCOUNTER — Encounter: Payer: Medicaid Other | Admitting: Women's Health

## 2019-08-23 ENCOUNTER — Other Ambulatory Visit: Payer: Self-pay | Admitting: Women's Health

## 2019-08-23 DIAGNOSIS — O10919 Unspecified pre-existing hypertension complicating pregnancy, unspecified trimester: Secondary | ICD-10-CM

## 2019-08-24 ENCOUNTER — Inpatient Hospital Stay (HOSPITAL_COMMUNITY)
Admission: AD | Admit: 2019-08-24 | Discharge: 2019-08-24 | Disposition: A | Discharge status: Home / Self Care | Payer: Medicaid Other | Attending: Obstetrics & Gynecology | Admitting: Obstetrics & Gynecology

## 2019-08-24 ENCOUNTER — Encounter (HOSPITAL_COMMUNITY): Payer: Self-pay | Admitting: Obstetrics & Gynecology

## 2019-08-24 ENCOUNTER — Other Ambulatory Visit: Payer: Self-pay

## 2019-08-24 DIAGNOSIS — Z3A31 31 weeks gestation of pregnancy: Secondary | ICD-10-CM | POA: Diagnosis not present

## 2019-08-24 DIAGNOSIS — Z0371 Encounter for suspected problem with amniotic cavity and membrane ruled out: Secondary | ICD-10-CM | POA: Diagnosis not present

## 2019-08-24 DIAGNOSIS — N898 Other specified noninflammatory disorders of vagina: Secondary | ICD-10-CM | POA: Insufficient documentation

## 2019-08-24 DIAGNOSIS — O26893 Other specified pregnancy related conditions, third trimester: Secondary | ICD-10-CM | POA: Diagnosis not present

## 2019-08-24 LAB — URINALYSIS, ROUTINE W REFLEX MICROSCOPIC
Bilirubin Urine: NEGATIVE
Glucose, UA: NEGATIVE mg/dL
Hgb urine dipstick: NEGATIVE
Ketones, ur: 5 mg/dL — AB
Leukocytes,Ua: NEGATIVE
Nitrite: NEGATIVE
Protein, ur: NEGATIVE mg/dL
Specific Gravity, Urine: 1.006 (ref 1.005–1.030)
pH: 7 (ref 5.0–8.0)

## 2019-08-24 NOTE — MAU Note (Signed)
Cheyenne Moore is a 29 y.o. at [redacted]w[redacted]d here in MAU reporting: this AM when she woke up the bed was wet, states there was a wet spot. Has not felt anymore leaking. No bleeding. No pain. +FM. Had IC last night.   Onset of complaint: today  Pain score: 0/10  Vitals:   08/24/19 0856  BP: 115/67  Pulse: 84  Resp: 18  Temp: 98.6 F (37 C)  SpO2: 99%     FHT:+FM  Lab orders placed from triage: UA

## 2019-08-24 NOTE — MAU Provider Note (Signed)
First Provider Initiated Contact with Patient 08/24/19 0911       S: Ms. Cheyenne Moore is a 29 y.o. G2P0010 at [redacted]w[redacted]d  who presents to MAU today complaining of leaking of fluid since 0800. She states she woke up and felt damp in bed. She has had no more leaking of fluid. She reports last intercourse last night and needing to urinate "really bad" when she woke up She denies vaginal bleeding. She denies contractions. She reports normal fetal movement.   O: BP 115/67 (BP Location: Right Arm)   Pulse 84   Temp 98.6 F (37 C) (Oral)   Resp 18   Ht 5\' 6"  (1.676 m)   Wt (!) 142.7 kg   LMP 01/18/2019   SpO2 99% Comment: room air  BMI 50.79 kg/m  GENERAL: Well-developed, well-nourished female in no acute distress.  HEAD: Normocephalic, atraumatic.  CHEST: Normal effort of breathing, regular heart rate ABDOMEN: Soft, nontender, gravid PELVIC: Normal external female genitalia. Vagina is pink and rugated. Cervix with normal contour, no lesions. Normal discharge.  No pooling.    Fetal Monitoring: Baseline: 135 Variability: moderate Accelerations: 15x15 Decelerations: none Contractions: none  Results for orders placed or performed during the hospital encounter of 08/24/19 (from the past 24 hour(s))  Urinalysis, Routine w reflex microscopic Urine, Clean Catch     Status: Abnormal   Collection Time: 08/24/19  8:49 AM  Result Value Ref Range   Color, Urine YELLOW YELLOW   APPearance CLEAR CLEAR   Specific Gravity, Urine 1.006 1.005 - 1.030   pH 7.0 5.0 - 8.0   Glucose, UA NEGATIVE NEGATIVE mg/dL   Hgb urine dipstick NEGATIVE NEGATIVE   Bilirubin Urine NEGATIVE NEGATIVE   Ketones, ur 5 (A) NEGATIVE mg/dL   Protein, ur NEGATIVE NEGATIVE mg/dL   Nitrite NEGATIVE NEGATIVE   Leukocytes,Ua NEGATIVE NEGATIVE   Fern Negative  A: SIUP at [redacted]w[redacted]d  Membranes intact  P: -Discharge home in stable condition -Preterm labor precautions discussed -Patient advised to follow-up with [facility]  as scheduled on Monday for prenatal care -Patient may return to MAU as needed or if her condition were to change or worsen   Wednesday, Rolm Bookbinder 08/24/2019 9:12 AM

## 2019-08-24 NOTE — Discharge Instructions (Signed)

## 2019-08-26 ENCOUNTER — Encounter: Payer: Self-pay | Admitting: Obstetrics & Gynecology

## 2019-08-26 ENCOUNTER — Ambulatory Visit (INDEPENDENT_AMBULATORY_CARE_PROVIDER_SITE_OTHER): Payer: Medicaid Other | Admitting: Obstetrics & Gynecology

## 2019-08-26 ENCOUNTER — Ambulatory Visit (INDEPENDENT_AMBULATORY_CARE_PROVIDER_SITE_OTHER): Payer: Medicaid Other

## 2019-08-26 VITALS — BP 117/81 | HR 94

## 2019-08-26 DIAGNOSIS — O24410 Gestational diabetes mellitus in pregnancy, diet controlled: Secondary | ICD-10-CM

## 2019-08-26 DIAGNOSIS — Z331 Pregnant state, incidental: Secondary | ICD-10-CM | POA: Diagnosis not present

## 2019-08-26 DIAGNOSIS — Z1389 Encounter for screening for other disorder: Secondary | ICD-10-CM

## 2019-08-26 DIAGNOSIS — Z3A31 31 weeks gestation of pregnancy: Secondary | ICD-10-CM

## 2019-08-26 DIAGNOSIS — O10919 Unspecified pre-existing hypertension complicating pregnancy, unspecified trimester: Secondary | ICD-10-CM

## 2019-08-26 DIAGNOSIS — O0990 Supervision of high risk pregnancy, unspecified, unspecified trimester: Secondary | ICD-10-CM

## 2019-08-26 DIAGNOSIS — E039 Hypothyroidism, unspecified: Secondary | ICD-10-CM

## 2019-08-26 NOTE — Progress Notes (Addendum)
[place] 31+3 wks,complete breech,fundal placenta gr 2,BPP 8/8,RI .66,.67,.54,.59=45%,EFW 1980 g 72%,AFI 13 cm

## 2019-08-26 NOTE — Progress Notes (Signed)
   HIGH-RISK PREGNANCY VISIT Patient name: Cheyenne Moore MRN [id number]  Date of birth: 30-Jul-1990 Chief Complaint:   Routine Prenatal Visit (Ultrasound)  History of Present Illness:   Cheyenne Moore is a 29 y.o. G59P0010 female at [redacted]w[redacted]d with an Estimated Date of Delivery: 10/25/19 being seen today for ongoing management of a high-risk pregnancy complicated by [facility] on labetalol 200 BID.  Today she reports no complaints.  Depression screen [facility] 2/9 07/24/2019 04/11/2019 05/03/2018 10/31/2017  Decreased Interest 0 0 0 0  Down, Depressed, Hopeless 0 0 0 0  PHQ - 2 Score 0 0 0 0  Altered sleeping 0 0 - -  Tired, decreased energy 1 0 - -  Change in appetite 0 0 - -  Feeling bad or failure about yourself  0 0 - -  Trouble concentrating 0 0 - -  Moving slowly or fidgety/restless 0 0 - -  Suicidal thoughts 0 0 - -  PHQ-9 Score 1 0 - -    Contractions: Not present. Vag. Bleeding: None.  Movement: Present. denies leaking of fluid.  Review of Systems:   Pertinent items are noted in HPI Denies abnormal vaginal discharge w/ itching/odor/irritation, headaches, visual changes, shortness of breath, chest pain, abdominal pain, severe nausea/vomiting, or problems with urination or bowel movements unless otherwise stated above. Pertinent History Reviewed:  Reviewed past medical,surgical, social, obstetrical and family history.  Reviewed problem list, medications and allergies. Physical Assessment:   Vitals:   08/26/19 1042  BP: 117/81  Pulse: 94  There is no height or weight on file to calculate BMI.           Physical Examination:   General appearance: alert, well appearing, and in no distress  Mental status: alert, oriented to person, place, and time  Skin: warm & dry   Extremities: Edema: Trace    Cardiovascular: normal heart rate noted  Respiratory: normal respiratory effort, no distress  Abdomen: gravid, soft, non-tender  Pelvic: Cervical exam deferred         Fetal Status:     Movement:  Present    Fetal Surveillance Testing today: BPP 8/8 Dopplers 45%   Chaperone: n/a    No results found for this or any previous visit (from the past 24 hour(s)).  Assessment & Plan:  1) High-risk pregnancy G2P0010 at [redacted]w[redacted]d with an Estimated Date of Delivery: 10/25/19   2) CHTN on labetalol 200 BID, stable, reassuring fetal surveillance today  3) A1DM, stable, borderline acceptable fastings, running 90-100, if remain in that range will need to add metformin  Meds: No orders of the defined types were placed in this encounter.   Labs/procedures today: BPP 8/8 with normal Dopplers  Treatment Plan:  Twice weekly surveillance   Reviewed: Preterm labor symptoms and general obstetric precautions including but not limited to vaginal bleeding, contractions, leaking of fluid and fetal movement were reviewed in detail with the patient.  All questions were answered. Has home bp cuff. Rx faxed to . Check bp weekly, let us know if >140/90.   Follow-up: Return in about 3 days (around 08/29/2019) for NST, Nurse only.  Orders Placed This Encounter  Procedures  . TSH  . POC Urinalysis Dipstick OB   Cheyenne Moore  08/26/2019 11:09 AM

## 2019-08-27 LAB — TSH: TSH: 1.39 u[IU]/mL (ref 0.450–4.500)

## 2019-08-28 ENCOUNTER — Other Ambulatory Visit: Payer: Self-pay | Admitting: Obstetrics & Gynecology

## 2019-08-28 DIAGNOSIS — O10919 Unspecified pre-existing hypertension complicating pregnancy, unspecified trimester: Secondary | ICD-10-CM

## 2019-08-28 DIAGNOSIS — O24410 Gestational diabetes mellitus in pregnancy, diet controlled: Secondary | ICD-10-CM

## 2019-08-29 ENCOUNTER — Ambulatory Visit (INDEPENDENT_AMBULATORY_CARE_PROVIDER_SITE_OTHER): Payer: Medicaid Other | Admitting: *Deleted

## 2019-08-29 DIAGNOSIS — Z331 Pregnant state, incidental: Secondary | ICD-10-CM

## 2019-08-29 DIAGNOSIS — O24410 Gestational diabetes mellitus in pregnancy, diet controlled: Secondary | ICD-10-CM | POA: Diagnosis not present

## 2019-08-29 DIAGNOSIS — Z3A31 31 weeks gestation of pregnancy: Secondary | ICD-10-CM

## 2019-08-29 DIAGNOSIS — O10919 Unspecified pre-existing hypertension complicating pregnancy, unspecified trimester: Secondary | ICD-10-CM

## 2019-08-29 DIAGNOSIS — O0990 Supervision of high risk pregnancy, unspecified, unspecified trimester: Secondary | ICD-10-CM

## 2019-08-29 DIAGNOSIS — Z1389 Encounter for screening for other disorder: Secondary | ICD-10-CM

## 2019-08-29 LAB — POCT URINALYSIS DIPSTICK OB
Blood, UA: NEGATIVE
Glucose, UA: NEGATIVE
Ketones, UA: NEGATIVE
Leukocytes, UA: NEGATIVE
Nitrite, UA: NEGATIVE
POC,PROTEIN,UA: NEGATIVE

## 2019-08-29 NOTE — Progress Notes (Signed)
   NURSE VISIT- NST  SUBJECTIVE:  Cheyenne Moore is a 29 y.o. G2P0010 female at [redacted]w[redacted]d, here for a NST for pregnancy complicated by [facility] and A2DM currently on no meds.  She reports active fetal movement, contractions: none, vaginal bleeding: none, membranes: intact.   OBJECTIVE:  LMP 01/18/2019   Appears well, no apparent distress  No results found for this or any previous visit (from the past 24 hour(s)).  NST: FHR baseline 135 bpm, Variability: moderate, Accelerations:present, Decelerations:  Absent= Cat 1/Reactive Toco: none   ASSESSMENT: G2P0010 at [redacted]w[redacted]d with CHTN and A2DM currently on no meds NST reactive  PLAN: EFM strip reviewed by Dr. Emelda Fear   Recommendations: keep next appointment as scheduled    Jobe Marker  08/29/2019 11:48 AM

## 2019-09-02 ENCOUNTER — Other Ambulatory Visit: Payer: Self-pay

## 2019-09-02 ENCOUNTER — Encounter: Payer: Self-pay | Admitting: Obstetrics and Gynecology

## 2019-09-02 ENCOUNTER — Ambulatory Visit (INDEPENDENT_AMBULATORY_CARE_PROVIDER_SITE_OTHER): Payer: Medicaid Other | Admitting: Obstetrics and Gynecology

## 2019-09-02 ENCOUNTER — Ambulatory Visit (INDEPENDENT_AMBULATORY_CARE_PROVIDER_SITE_OTHER): Payer: Medicaid Other

## 2019-09-02 VITALS — BP 108/66 | HR 98 | Wt 313.2 lb

## 2019-09-02 DIAGNOSIS — Z1389 Encounter for screening for other disorder: Secondary | ICD-10-CM | POA: Diagnosis not present

## 2019-09-02 DIAGNOSIS — Z331 Pregnant state, incidental: Secondary | ICD-10-CM

## 2019-09-02 DIAGNOSIS — O10913 Unspecified pre-existing hypertension complicating pregnancy, third trimester: Secondary | ICD-10-CM | POA: Diagnosis not present

## 2019-09-02 DIAGNOSIS — Z3A32 32 weeks gestation of pregnancy: Secondary | ICD-10-CM | POA: Diagnosis not present

## 2019-09-02 DIAGNOSIS — O10919 Unspecified pre-existing hypertension complicating pregnancy, unspecified trimester: Secondary | ICD-10-CM

## 2019-09-02 DIAGNOSIS — O24410 Gestational diabetes mellitus in pregnancy, diet controlled: Secondary | ICD-10-CM | POA: Diagnosis not present

## 2019-09-02 DIAGNOSIS — O0990 Supervision of high risk pregnancy, unspecified, unspecified trimester: Secondary | ICD-10-CM

## 2019-09-02 DIAGNOSIS — Z3A36 36 weeks gestation of pregnancy: Secondary | ICD-10-CM

## 2019-09-02 LAB — POCT URINALYSIS DIPSTICK OB
Blood, UA: NEGATIVE
Glucose, UA: NEGATIVE
Leukocytes, UA: NEGATIVE
Nitrite, UA: NEGATIVE
POC,PROTEIN,UA: NEGATIVE

## 2019-09-02 NOTE — Progress Notes (Signed)
[place] 32+3 wks,cephalic,anterior placenta gr 2,afi 11 cm,BPP 8/8,FHR 146 bpm,RI .53,.64,.62,.61=57%,BPP 8/8

## 2019-09-02 NOTE — Progress Notes (Addendum)
Patient ID: Cheyenne Moore, female   DOB: [birth year], 29 y.o.   MRN: [id number]    [facility] PREGNANCY VISIT Patient name: Cheyenne Moore MRN [id number]  Date of birth: [birth year] Chief Complaint:   High Risk Gestation ([place] today)  History of Present Illness:   Cheyenne Moore is a 29 y.o. G3P0010 female at [redacted]w[redacted]d with an Estimated Date of Delivery: 10/25/19 being seen today for ongoing management of a high-risk pregnancy complicated by [facility] on labetalol 200 BID and A1DM with borderline acceptable fastings. Today she reports feet swelling. Denies vagina bleeding, vision changes, and contraction. The baby is staying active. She would like to breast feed the baby. She is planning to use birth control pills for contraception after delivery. Her fasting sugars have been running in the upper 90s.  Contractions: Not present. Vag. Bleeding: None.  Movement: Present. denies leaking of fluid.  Review of Systems:   Pertinent items are noted in HPI Denies abnormal vaginal discharge w/ itching/odor/irritation, headaches, visual changes, shortness of breath, chest pain, abdominal pain, severe nausea/vomiting, or problems with urination or bowel movements unless otherwise stated above. Pertinent History Reviewed:  Reviewed past medical,surgical, social, obstetrical and family history.  Reviewed problem list, medications and allergies. Physical Assessment:   Vitals:   09/02/19 1037  BP: 108/66  Pulse: 98  Weight: (!) 313 lb 3.2 oz (142.1 kg)  Body mass index is 50.55 kg/m.           Physical Examination:   General appearance: alert, well appearing, and in no distress and oriented to person, place, and time  Mental status: alert, oriented to person, place, and time, normal mood, behavior, speech, dress, motor activity, and thought processes, affect appropriate to mood  Skin: warm & dry   Extremities: Edema: Trace    Cardiovascular: normal heart rate noted  Respiratory: normal respiratory effort, no  distress  Abdomen: gravid, soft, non-tender  Pelvic: Cervical exam deferred         Fetal Status:   Fundal Height: 32 cm Movement: Present    Fetal Surveillance Testing today: [place] (pending) Study Result  Narrative & Impression  FOLLOW UP SONOGRAM   Cheyenne Moore is in the office for a follow up sonogram for BPP and cord dopplers.  She is a 29 y.o. year old G2P0010 with Estimated Date of Delivery: 10/25/19 by LMP now at  [redacted]w[redacted]d weeks gestation. Thus far the pregnancy has been complicated by CHTN,GDM.  GESTATION: SINGLETON  PRESENTATION: cephalic  FETAL ACTIVITY:          Heart rate         146          The fetus is active.  AMNIOTIC FLUID: The amniotic fluid volume is  normal, 11 cm.  PLACENTA LOCALIZATION:  anterior GRADE 2  CERVIX: Limited view  ADNEXA: wnl   GESTATIONAL AGE AND  BIOMETRICS:  Gestational criteria: Estimated Date of Delivery: 10/25/19 by LMP now at [redacted]w[redacted]d  Previous Scans:6   BIOPHYSICAL PROFILE:  COMMENTS GROSS BODY MOVEMENT                 2   TONE                2   RESPIRATIONS                2   AMNIOTIC FLUID                2                                                            SCORE:  8/8 (Note: NST was not performed as part of this antepartum testing)   DOPPLER FLOW STUDIES: UMBILICAL ARTERY RI RATIOS:    .53,.64,.62,.61=57%   ANATOMICAL SURVEY                                                                            COMMENTS CEREBRAL VENTRICLES yes normal   CHOROID PLEXUS yes normal   CEREBELLUM yes normal                       NOSE/LIP yes normal   FACIAL PROFILE yes normal   4 CHAMBERED HEART yes normal   OUTFLOW TRACTS yes normal   DIAPHRAGM yes normal   STOMACH yes normal   RENAL REGION yes normal   BLADDER yes normal        3 VESSEL CORD yes normal                    GENITALIA yes normal female        SUSPECTED ABNORMALITIES:  no  QUALITY OF SCAN: Limited view   TECHNICIAN COMMENTS:  [place] 32+3 wks,cephalic,anterior placenta gr 2,afi 11 cm,BPP 8/8,FHR 146 bpm,RI .53,.64,.62,.61=57%,BPP 8/8   Amber Flora Lipps 09/02/2019 10:38 AM     Results for orders placed or performed in visit on 09/02/19 (from the past 24 hour(s))  POC Urinalysis Dipstick OB   Collection Time: 09/02/19 10:38 AM  Result Value Ref Range   Color, UA     Clarity, UA     Glucose, UA Negative Negative   Bilirubin, UA     Ketones, UA trace    Spec Grav, UA     Blood, UA neg    pH, UA     POC,PROTEIN,UA Negative Negative, Trace, Small (1+), Moderate (2+), Large (3+), 4+   Urobilinogen, UA     Nitrite, UA neg    Leukocytes, UA Negative Negative   Appearance     Odor      Assessment & Plan:  1) High-risk pregnancy G2P0010 at [redacted]w[redacted]d with an Estimated Date of Delivery: 10/25/19   2) CHTN on labetalol 200 BID, stable, reassuring fetal surveillance today. Reassuring dopplers  3) A1DM, stable, borderline acceptable fastings, running 90-100, if remain in that range will need to add metformin  Meds: No orders of the defined types were placed in this encounter.  Labs/procedures today: none  Treatment Plan:  Twice weekly surveillance NsT /BPP c dopplers                               ASA til 37 wk                                 Reviewed: Preterm labor symptoms and general obstetric precautions including but not limited to vaginal bleeding, contractions, leaking of fluid and fetal movement were reviewed in detail with the patient.  All questions were answered. Has home bp cuff. Check bp weekly, let us know if >140/90.   Follow-up: Return in about 3 days (around 09/05/2019) for HROB, NST.  By signing my name below, I, Pietro Cassis, attest that this documentation has been prepared under the direction and in the presence of Tilda Burrow,  MD. Electronically Signed: Pietro Cassis, Medical Scribe. 09/02/19. 11:20 AM.  I personally performed the services described in this documentation, which was SCRIBED in my presence. The recorded information has been reviewed and considered accurate. It has been edited as necessary during review. Tilda Burrow, MD

## 2019-09-05 ENCOUNTER — Other Ambulatory Visit: Payer: Self-pay

## 2019-09-05 ENCOUNTER — Ambulatory Visit (INDEPENDENT_AMBULATORY_CARE_PROVIDER_SITE_OTHER): Payer: Medicaid Other | Admitting: *Deleted

## 2019-09-05 VITALS — BP 122/80 | HR 85 | Wt 318.4 lb

## 2019-09-05 DIAGNOSIS — O0990 Supervision of high risk pregnancy, unspecified, unspecified trimester: Secondary | ICD-10-CM

## 2019-09-05 DIAGNOSIS — O10919 Unspecified pre-existing hypertension complicating pregnancy, unspecified trimester: Secondary | ICD-10-CM | POA: Diagnosis not present

## 2019-09-05 DIAGNOSIS — Z1389 Encounter for screening for other disorder: Secondary | ICD-10-CM

## 2019-09-05 DIAGNOSIS — O288 Other abnormal findings on antenatal screening of mother: Secondary | ICD-10-CM

## 2019-09-05 DIAGNOSIS — Z331 Pregnant state, incidental: Secondary | ICD-10-CM

## 2019-09-05 DIAGNOSIS — O24410 Gestational diabetes mellitus in pregnancy, diet controlled: Secondary | ICD-10-CM

## 2019-09-05 LAB — POCT URINALYSIS DIPSTICK OB
Blood, UA: NEGATIVE
Glucose, UA: NEGATIVE
Ketones, UA: NEGATIVE
Leukocytes, UA: NEGATIVE
Nitrite, UA: NEGATIVE
POC,PROTEIN,UA: NEGATIVE

## 2019-09-05 NOTE — Progress Notes (Addendum)
   NURSE VISIT- NST  SUBJECTIVE:  Cheyenne Moore is a 29 y.o. G2P0010 female at [redacted]w[redacted]d, here for a NST for pregnancy complicated by [facility] and A1DM.  She reports active fetal movement, contractions: none, vaginal bleeding: none, membranes: intact.   OBJECTIVE:  BP 122/80   Pulse 85   Wt (!) 318 lb 6.4 oz (144.4 kg)   LMP 01/18/2019   BMI 51.39 kg/m   Appears well, no apparent distress  Results for orders placed or performed in visit on 09/05/19 (from the past 24 hour(s))  POC Urinalysis Dipstick OB   Collection Time: 09/05/19 10:40 AM  Result Value Ref Range   Color, UA     Clarity, UA     Glucose, UA Negative Negative   Bilirubin, UA     Ketones, UA neg    Spec Grav, UA     Blood, UA neg    pH, UA     POC,PROTEIN,UA Negative Negative, Trace, Small (1+), Moderate (2+), Large (3+), 4+   Urobilinogen, UA     Nitrite, UA neg    Leukocytes, UA Negative Negative   Appearance     Odor      NST: FHR baseline 140 bpm, Variability: moderate, Accelerations:present, Decelerations:  Absent= Cat 1/Reactive Toco: none   ASSESSMENT: G2P0010 at [redacted]w[redacted]d with CHTN and A1DM NST reactive  PLAN: EFM strip reviewed by Joellyn Haff, CNM, [facility]   Recommendations: keep next appointment as scheduled    Jobe Marker  09/05/2019 12:22 PM   Chart reviewed for nurse visit. Agree with plan of care.  Cheral Marker, [place] 09/05/2019 2:04 PM

## 2019-09-06 ENCOUNTER — Other Ambulatory Visit: Payer: Self-pay | Admitting: Obstetrics & Gynecology

## 2019-09-06 DIAGNOSIS — O10919 Unspecified pre-existing hypertension complicating pregnancy, unspecified trimester: Secondary | ICD-10-CM

## 2019-09-09 ENCOUNTER — Ambulatory Visit (INDEPENDENT_AMBULATORY_CARE_PROVIDER_SITE_OTHER): Payer: Medicaid Other | Admitting: Obstetrics & Gynecology

## 2019-09-09 ENCOUNTER — Encounter: Payer: Self-pay | Admitting: Obstetrics & Gynecology

## 2019-09-09 ENCOUNTER — Ambulatory Visit (INDEPENDENT_AMBULATORY_CARE_PROVIDER_SITE_OTHER): Payer: Medicaid Other

## 2019-09-09 VITALS — BP 121/85 | HR 89 | Wt 315.0 lb

## 2019-09-09 DIAGNOSIS — O10913 Unspecified pre-existing hypertension complicating pregnancy, third trimester: Secondary | ICD-10-CM

## 2019-09-09 DIAGNOSIS — Z3A33 33 weeks gestation of pregnancy: Secondary | ICD-10-CM | POA: Diagnosis not present

## 2019-09-09 DIAGNOSIS — O24410 Gestational diabetes mellitus in pregnancy, diet controlled: Secondary | ICD-10-CM | POA: Diagnosis not present

## 2019-09-09 DIAGNOSIS — O10919 Unspecified pre-existing hypertension complicating pregnancy, unspecified trimester: Secondary | ICD-10-CM

## 2019-09-09 DIAGNOSIS — O0990 Supervision of high risk pregnancy, unspecified, unspecified trimester: Secondary | ICD-10-CM

## 2019-09-09 DIAGNOSIS — O0993 Supervision of high risk pregnancy, unspecified, third trimester: Secondary | ICD-10-CM

## 2019-09-09 NOTE — Progress Notes (Signed)
   HIGH-RISK PREGNANCY VISIT Patient name: Cheyenne Moore MRN [id number]  Date of birth: January 19, 1990 Chief Complaint:   Routine Prenatal Visit ([place] today)  History of Present Illness:   Cheyenne Moore is a 29 y.o. G49P0010 female at [redacted]w[redacted]d with an Estimated Date of Delivery: 10/25/19 being seen today for ongoing management of a high-risk pregnancy complicated by [facility] on labetalol 200 mg BID and GDM.  Today she reports no complaints.  Depression screen [facility] 2/9 07/24/2019 04/11/2019 05/03/2018 10/31/2017  Decreased Interest 0 0 0 0  Down, Depressed, Hopeless 0 0 0 0  PHQ - 2 Score 0 0 0 0  Altered sleeping 0 0 - -  Tired, decreased energy 1 0 - -  Change in appetite 0 0 - -  Feeling bad or failure about yourself  0 0 - -  Trouble concentrating 0 0 - -  Moving slowly or fidgety/restless 0 0 - -  Suicidal thoughts 0 0 - -  PHQ-9 Score 1 0 - -    Contractions: Not present. Vag. Bleeding: None.  Movement: Present. denies leaking of fluid.  Review of Systems:   Pertinent items are noted in HPI Denies abnormal vaginal discharge w/ itching/odor/irritation, headaches, visual changes, shortness of breath, chest pain, abdominal pain, severe nausea/vomiting, or problems with urination or bowel movements unless otherwise stated above. Pertinent History Reviewed:  Reviewed past medical,surgical, social, obstetrical and family history.  Reviewed problem list, medications and allergies. Physical Assessment:   Vitals:   09/09/19 1027  BP: 121/85  Pulse: 89  Weight: (!) 315 lb (142.9 kg)  Body mass index is 50.84 kg/m.           Physical Examination:   General appearance: alert, well appearing, and in no distress  Mental status: alert, oriented to person, place, and time  Skin: warm & dry   Extremities: Edema: Trace    Cardiovascular: normal heart rate noted  Respiratory: normal respiratory effort, no distress  Abdomen: gravid, soft, non-tender  Pelvic: Cervical exam deferred         Fetal  Status:     Movement: Present    Fetal Surveillance Testing today: BPP 8/8 w/normal Dopplers   Chaperone: n/a    No results found for this or any previous visit (from the past 24 hour(s)).  Assessment & Plan:  1) High-risk pregnancy G2P0010 at [redacted]w[redacted]d with an Estimated Date of Delivery: 10/25/19   2) CHTN, stable, on labetalol 200 BID  3) GDM, stable, did not bring CBG for last week, encouraged to record all CBG and bring each and every visit  Meds: No orders of the defined types were placed in this encounter.   Labs/procedures today:   Treatment Plan:  Twice weekly surveillance  Reviewed: Preterm labor symptoms and general obstetric precautions including but not limited to vaginal bleeding, contractions, leaking of fluid and fetal movement were reviewed in detail with the patient.  All questions were answered. Has home bp cuff. Rx faxed to . Check bp weekly, let us know if >140/90.   Follow-up: Return in about 3 days (around 09/12/2019) for NST, Nurse only.  No orders of the defined types were placed in this encounter.  Lazaro Arms  09/09/2019 10:57 AM

## 2019-09-09 NOTE — Progress Notes (Signed)
[place] 33+3 wks,frank breech,AFI 21 cm,anterior placenta gr 3,RI .57,.57,.58,.50=22.8%,BPP 8/8,FHR 140 bpm

## 2019-09-10 ENCOUNTER — Ambulatory Visit (INDEPENDENT_AMBULATORY_CARE_PROVIDER_SITE_OTHER): Payer: 59 | Admitting: Internal Medicine

## 2019-09-12 ENCOUNTER — Ambulatory Visit (INDEPENDENT_AMBULATORY_CARE_PROVIDER_SITE_OTHER): Payer: Medicaid Other | Admitting: *Deleted

## 2019-09-12 VITALS — BP 115/75 | HR 91 | Wt 314.8 lb

## 2019-09-12 DIAGNOSIS — O288 Other abnormal findings on antenatal screening of mother: Secondary | ICD-10-CM

## 2019-09-12 DIAGNOSIS — O0990 Supervision of high risk pregnancy, unspecified, unspecified trimester: Secondary | ICD-10-CM

## 2019-09-12 DIAGNOSIS — O24410 Gestational diabetes mellitus in pregnancy, diet controlled: Secondary | ICD-10-CM | POA: Diagnosis not present

## 2019-09-12 DIAGNOSIS — Z331 Pregnant state, incidental: Secondary | ICD-10-CM

## 2019-09-12 DIAGNOSIS — O10913 Unspecified pre-existing hypertension complicating pregnancy, third trimester: Secondary | ICD-10-CM

## 2019-09-12 DIAGNOSIS — Z1389 Encounter for screening for other disorder: Secondary | ICD-10-CM

## 2019-09-12 DIAGNOSIS — O10919 Unspecified pre-existing hypertension complicating pregnancy, unspecified trimester: Secondary | ICD-10-CM

## 2019-09-12 LAB — POCT URINALYSIS DIPSTICK OB
Blood, UA: NEGATIVE
Glucose, UA: NEGATIVE
Leukocytes, UA: NEGATIVE
Nitrite, UA: NEGATIVE
POC,PROTEIN,UA: NEGATIVE

## 2019-09-12 NOTE — Progress Notes (Addendum)
   NURSE VISIT- NST  SUBJECTIVE:  Cheyenne Moore is a 29 y.o. G2P0010 female at [redacted]w[redacted]d, here for a NST for pregnancy complicated by [facility] and A1DM.  She reports active fetal movement, contractions: none, vaginal bleeding: none, membranes: intact.   OBJECTIVE:  BP 115/75   Pulse 91   Wt (!) 314 lb 12.8 oz (142.8 kg)   LMP 01/18/2019   BMI 50.81 kg/m   Appears well, no apparent distress  Results for orders placed or performed in visit on 09/12/19 (from the past 24 hour(s))  POC Urinalysis Dipstick OB   Collection Time: 09/12/19 11:13 AM  Result Value Ref Range   Color, UA     Clarity, UA     Glucose, UA Negative Negative   Bilirubin, UA     Ketones, UA moderate    Spec Grav, UA     Blood, UA neg    pH, UA     POC,PROTEIN,UA Negative Negative, Trace, Small (1+), Moderate (2+), Large (3+), 4+   Urobilinogen, UA     Nitrite, UA neg    Leukocytes, UA Negative Negative   Appearance     Odor      NST: FHR baseline 140 bpm, Variability: moderate, Accelerations:present, Decelerations:  Absent= Cat 1 /Reactive Toco: none   ASSESSMENT: G2P0010 at [redacted]w[redacted]d with CHTN and A1DM NST reactive  PLAN: EFM strip reviewed by Dr. Despina Hidden   Recommendations: keep next appointment as scheduled    Jobe Marker  09/12/2019 12:07 PM  Attestation of Attending Supervision of Nursing Visit Encounter: Evaluation and management procedures were performed by the nursing staff under my supervision and collaboration.  I have reviewed the nurse's note and chart, and I agree with the management and plan.  Rockne Coons MD Attending Physician for the Center for [facility] 09/12/2019 2:21 PM

## 2019-09-13 ENCOUNTER — Other Ambulatory Visit: Payer: Self-pay | Admitting: Obstetrics & Gynecology

## 2019-09-13 DIAGNOSIS — O10919 Unspecified pre-existing hypertension complicating pregnancy, unspecified trimester: Secondary | ICD-10-CM

## 2019-09-14 ENCOUNTER — Other Ambulatory Visit (INDEPENDENT_AMBULATORY_CARE_PROVIDER_SITE_OTHER): Payer: Self-pay | Admitting: Nurse Practitioner

## 2019-09-16 ENCOUNTER — Encounter: Payer: Self-pay | Admitting: Women's Health

## 2019-09-16 ENCOUNTER — Ambulatory Visit (INDEPENDENT_AMBULATORY_CARE_PROVIDER_SITE_OTHER): Payer: Medicaid Other | Admitting: Women's Health

## 2019-09-16 ENCOUNTER — Ambulatory Visit (INDEPENDENT_AMBULATORY_CARE_PROVIDER_SITE_OTHER): Payer: Medicaid Other

## 2019-09-16 VITALS — BP 117/84 | HR 81 | Wt 319.5 lb

## 2019-09-16 DIAGNOSIS — O10919 Unspecified pre-existing hypertension complicating pregnancy, unspecified trimester: Secondary | ICD-10-CM

## 2019-09-16 DIAGNOSIS — Z1389 Encounter for screening for other disorder: Secondary | ICD-10-CM

## 2019-09-16 DIAGNOSIS — Z3A35 35 weeks gestation of pregnancy: Secondary | ICD-10-CM | POA: Diagnosis not present

## 2019-09-16 DIAGNOSIS — O10913 Unspecified pre-existing hypertension complicating pregnancy, third trimester: Secondary | ICD-10-CM

## 2019-09-16 DIAGNOSIS — O0993 Supervision of high risk pregnancy, unspecified, third trimester: Secondary | ICD-10-CM | POA: Diagnosis not present

## 2019-09-16 DIAGNOSIS — O24419 Gestational diabetes mellitus in pregnancy, unspecified control: Secondary | ICD-10-CM

## 2019-09-16 DIAGNOSIS — O0990 Supervision of high risk pregnancy, unspecified, unspecified trimester: Secondary | ICD-10-CM

## 2019-09-16 DIAGNOSIS — Z331 Pregnant state, incidental: Secondary | ICD-10-CM

## 2019-09-16 DIAGNOSIS — E039 Hypothyroidism, unspecified: Secondary | ICD-10-CM

## 2019-09-16 LAB — POCT URINALYSIS DIPSTICK OB
Blood, UA: NEGATIVE
Glucose, UA: NEGATIVE
Ketones, UA: NEGATIVE
Leukocytes, UA: NEGATIVE
Nitrite, UA: NEGATIVE
POC,PROTEIN,UA: NEGATIVE

## 2019-09-16 MED ORDER — METFORMIN HCL 500 MG PO TABS: 500.0000 mg | ORAL_TABLET | Freq: Every day | ORAL | 2 refills | Status: DC

## 2019-09-16 NOTE — Patient Instructions (Signed)
Cheyenne Moore, I greatly value your feedback.  If you receive a survey following your visit with [place] today, we appreciate you taking the time to fill it out.  Thanks, Joellyn Haff, CNM, WHNP-BC  [facility] ([address] [place], [place] [address]) Entrance C, located off of [address] valet parking   Go to [email or web address] to register for FREE online childbirth classes    Call the office [phone]) or go to [facility] if:  You begin to have strong, frequent contractions  Your water breaks.  Sometimes it is a big gush of fluid, sometimes it is just a trickle that keeps getting your panties wet or running down your legs  You have vaginal bleeding.  It is normal to have a small amount of spotting if your cervix was checked.   You don't feel your baby moving like normal.  If you don't, get you something to eat and drink and lay down and focus on feeling your baby move.  You should feel at least 10 movements in 2 hours.  If you don't, you should call the office or go to [facility].   Call the office [phone]) or go to [facility] hospital for these signs of pre-eclampsia:  Severe headache that does not go away with Tylenol  Visual changes- seeing spots, double, blurred vision  Pain under your right breast or upper abdomen that does not go away with Tums or heartburn medicine  Nausea and/or vomiting  Severe swelling in your hands, feet, and face    Home Blood Pressure Monitoring for Patients   Your provider has recommended that you check your blood pressure (BP) at least once a week at home. If you do not have a blood pressure cuff at home, one will be provided for you. Contact your provider if you have not received your monitor within 1 week.   Helpful Tips for Accurate Home Blood Pressure Checks  . Don't smoke, exercise, or drink caffeine 30 minutes before checking your BP . Use the restroom before checking your BP (a full  bladder can raise your pressure) . Relax in a comfortable upright chair . Feet on the ground . Left arm resting comfortably on a flat surface at the level of your heart . Legs uncrossed . Back supported . Sit quietly and don't talk . Place the cuff on your bare arm . Adjust snuggly, so that only two fingertips can fit between your skin and the top of the cuff . Check 2 readings separated by at least one minute . Keep a log of your BP readings . For a visual, please reference this diagram: http://ccnc.care/bpdiagram  Provider Name: [facility]     Phone: [phone]  Zone 1: ALL CLEAR  Continue to monitor your symptoms:  . BP reading is less than 140 (top number) or less than 90 (bottom number)  . No right upper stomach pain . No headaches or seeing spots . No feeling nauseated or throwing up . No swelling in face and hands  Zone 2: CAUTION Call your doctor's office for any of the following:  . BP reading is greater than 140 (top number) or greater than 90 (bottom number)  . Stomach pain under your ribs in the middle or right side . Headaches or seeing spots . Feeling nauseated or throwing up . Swelling in face and hands  Zone 3: EMERGENCY  Seek immediate medical care if you have any of  the following:  . BP reading is greater than160 (top number) or greater than 110 (bottom number) . Severe headaches not improving with Tylenol . Serious difficulty catching your breath . Any worsening symptoms from Zone 2  Preterm Labor and Birth Information  The normal length of a pregnancy is 39-41 weeks. Preterm labor is when labor starts before 37 completed weeks of pregnancy. What are the risk factors for preterm labor? Preterm labor is more likely to occur in women who:  Have certain infections during pregnancy such as a bladder infection, sexually transmitted infection, or infection inside the uterus (chorioamnionitis).  Have a shorter-than-normal cervix.  Have gone into  preterm labor before.  Have had surgery on their cervix.  Are younger than age 54 or older than age 25.  Are African American.  Are pregnant with twins or multiple babies (multiple gestation).  Take street drugs or smoke while pregnant.  Do not gain enough weight while pregnant.  Became pregnant shortly after having been pregnant. What are the symptoms of preterm labor? Symptoms of preterm labor include:  Cramps similar to those that can happen during a menstrual period. The cramps may happen with diarrhea.  Pain in the abdomen or lower back.  Regular uterine contractions that may feel like tightening of the abdomen.  A feeling of increased pressure in the pelvis.  Increased watery or bloody mucus discharge from the vagina.  Water breaking (ruptured amniotic sac). Why is it important to recognize signs of preterm labor? It is important to recognize signs of preterm labor because babies who are born prematurely may not be fully developed. This can put them at an increased risk for:  Long-term (chronic) heart and lung problems.  Difficulty immediately after birth with regulating body systems, including blood sugar, body temperature, heart rate, and breathing rate.  Bleeding in the brain.  Cerebral palsy.  Learning difficulties.  Death. These risks are highest for babies who are born before 48 weeks of pregnancy. How is preterm labor treated? Treatment depends on the length of your pregnancy, your condition, and the health of your baby. It may involve: 1. Having a stitch (suture) placed in your cervix to prevent your cervix from opening too early (cerclage). 2. Taking or being given medicines, such as: ? Hormone medicines. These may be given early in pregnancy to help support the pregnancy. ? Medicine to stop contractions. ? Medicines to help mature the baby's lungs. These may be prescribed if the risk of delivery is high. ? Medicines to prevent your baby from  developing cerebral palsy. If the labor happens before 34 weeks of pregnancy, you may need to stay in the hospital. What should I do if I think I am in preterm labor? If you think that you are going into preterm labor, call your health care provider right away. How can I prevent preterm labor in future pregnancies? To increase your chance of having a full-term pregnancy:  Do not use any tobacco products, such as cigarettes, chewing tobacco, and e-cigarettes. If you need help quitting, ask your health care provider.  Do not use street drugs or medicines that have not been prescribed to you during your pregnancy.  Talk with your health care provider before taking any herbal supplements, even if you have been taking them regularly.  Make sure you gain a healthy amount of weight during your pregnancy.  Watch for infection. If you think that you might have an infection, get it checked right away.  Make sure to  tell your health care provider if you have gone into preterm labor before. This information is not intended to replace advice given to you by your health care provider. Make sure you discuss any questions you have with your health care provider. Document Revised: 04/27/2018 Document Reviewed: 05/27/2015 Elsevier Patient Education  [place].

## 2019-09-16 NOTE — Progress Notes (Signed)
[place] 34+3 wks,cephalic,BPP 8/8,anterior placenta gr 3,FHR 146 bpm,afi 19.7 cm,RI .60,.68,.55=55%

## 2019-09-16 NOTE — Progress Notes (Signed)
HIGH-RISK PREGNANCY VISIT Patient name: Cheyenne Moore MRN [id number]  Date of birth: 1990-12-07 Chief Complaint:   Routine Prenatal Visit  History of Present Illness:   Cheyenne Moore is a 29 y.o. G18P0010 female at [redacted]w[redacted]d with an Estimated Date of Delivery: 10/25/19 being seen today for ongoing management of a high-risk pregnancy complicated by chronic hypertension currently on Labetalol 200mg  BID and diabetes mellitus A1DM.  Today she reports FBS 89-110 (1/2 >95), 2hr pp mostly normal, only checking 1-2 x/daily, sometimes forgets, sometimes only eats 1-2 meals.  Depression screen [facility] 2/9 07/24/2019 04/11/2019 05/03/2018 10/31/2017  Decreased Interest 0 0 0 0  Down, Depressed, Hopeless 0 0 0 0  PHQ - 2 Score 0 0 0 0  Altered sleeping 0 0 - -  Tired, decreased energy 1 0 - -  Change in appetite 0 0 - -  Feeling bad or failure about yourself  0 0 - -  Trouble concentrating 0 0 - -  Moving slowly or fidgety/restless 0 0 - -  Suicidal thoughts 0 0 - -  PHQ-9 Score 1 0 - -    Contractions: Not present. Vag. Bleeding: None.  Movement: Present. denies leaking of fluid.  Review of Systems:   Pertinent items are noted in HPI Denies abnormal vaginal discharge w/ itching/odor/irritation, headaches, visual changes, shortness of breath, chest pain, abdominal pain, severe nausea/vomiting, or problems with urination or bowel movements unless otherwise stated above. Pertinent History Reviewed:  Reviewed past medical,surgical, social, obstetrical and family history.  Reviewed problem list, medications and allergies. Physical Assessment:   Vitals:   09/16/19 1026  BP: 117/84  Pulse: 81  Weight: (!) 319 lb 8 oz (144.9 kg)  Body mass index is 51.57 kg/m.           Physical Examination:   General appearance: alert, well appearing, and in no distress  Mental status: alert, oriented to person, place, and time  Skin: warm & dry   Extremities: Edema: Trace    Cardiovascular: normal heart rate  noted  Respiratory: normal respiratory effort, no distress  Abdomen: gravid, soft, non-tender  Pelvic: Cervical exam deferred         Fetal Status: Fetal Heart Rate (bpm): 146 u/s   Movement: Present    Fetal Surveillance Testing today: 09/18/19 34+3 wks,cephalic,BPP 8/8,anterior placenta gr 3,FHR 146 bpm,afi 19.7 cm,RI .60,.68,.55=55%  Chaperone: n/a    Results for orders placed or performed in visit on 09/16/19 (from the past 24 hour(s))  POC Urinalysis Dipstick OB   Collection Time: 09/16/19 10:29 AM  Result Value Ref Range   Color, UA     Clarity, UA     Glucose, UA Negative Negative   Bilirubin, UA     Ketones, UA neg    Spec Grav, UA     Blood, UA neg    pH, UA     POC,PROTEIN,UA Negative Negative, Trace, Small (1+), Moderate (2+), Large (3+), 4+   Urobilinogen, UA     Nitrite, UA neg    Leukocytes, UA Negative Negative   Appearance     Odor      Assessment & Plan:  1) High-risk pregnancy G2P0010 at [redacted]w[redacted]d with an Estimated Date of Delivery: 10/25/19   2) CHTN, stable on Labetalol 200mg  BID, ASA  3) A2DM, unstable, meds added today, rx metformin 500mg  PM, try to check more 2hr pp  4) Hypothyroidism> on NP thyroid 120mg , had problem getting 120mg  pills last time so pharmacy had to  give her 2 60mg . Last TSH 8/9: 1.390   Meds: No orders of the defined types were placed in this encounter.   Labs/procedures today: u/s  Treatment Plan:  Growth u/s q4wks    2x/wk testing nst/sono     Deliver 38-39wks (37wks or prn if poor control)  Reviewed: Preterm labor symptoms and general obstetric precautions including but not limited to vaginal bleeding, contractions, leaking of fluid and fetal movement were reviewed in detail with the patient.  All questions were answered. Has home bp cuff. Check bp weekly, let 10/9 know if >140/90.   Follow-up: Return for Thurs nst/nurse; please add weekly nst/nurse visits.  Orders Placed This Encounter  Procedures  . POC Urinalysis Dipstick OB    [place] CNM, [facility] 09/16/2019 10:48 AM

## 2019-09-19 ENCOUNTER — Ambulatory Visit (INDEPENDENT_AMBULATORY_CARE_PROVIDER_SITE_OTHER): Payer: Medicaid Other | Admitting: *Deleted

## 2019-09-19 ENCOUNTER — Other Ambulatory Visit: Payer: Self-pay | Admitting: Women's Health

## 2019-09-19 VITALS — BP 124/75 | HR 90 | Wt 320.0 lb

## 2019-09-19 DIAGNOSIS — O24419 Gestational diabetes mellitus in pregnancy, unspecified control: Secondary | ICD-10-CM | POA: Diagnosis not present

## 2019-09-19 DIAGNOSIS — O0990 Supervision of high risk pregnancy, unspecified, unspecified trimester: Secondary | ICD-10-CM | POA: Diagnosis not present

## 2019-09-19 DIAGNOSIS — O10919 Unspecified pre-existing hypertension complicating pregnancy, unspecified trimester: Secondary | ICD-10-CM | POA: Diagnosis not present

## 2019-09-19 DIAGNOSIS — Z3A34 34 weeks gestation of pregnancy: Secondary | ICD-10-CM | POA: Diagnosis not present

## 2019-09-19 DIAGNOSIS — Z331 Pregnant state, incidental: Secondary | ICD-10-CM | POA: Diagnosis not present

## 2019-09-19 DIAGNOSIS — Z1389 Encounter for screening for other disorder: Secondary | ICD-10-CM

## 2019-09-19 LAB — POCT URINALYSIS DIPSTICK OB
Blood, UA: NEGATIVE
Glucose, UA: NEGATIVE
Ketones, UA: NEGATIVE
Leukocytes, UA: NEGATIVE
Nitrite, UA: NEGATIVE
POC,PROTEIN,UA: NEGATIVE

## 2019-09-19 MED ORDER — THYROID 120 MG PO TABS: 120.0000 mg | ORAL_TABLET | Freq: Every day | ORAL | 6 refills | Status: DC

## 2019-09-19 NOTE — Progress Notes (Signed)
   NURSE VISIT- NST  SUBJECTIVE:  Cheyenne Moore is a 29 y.o. G2P0010 female at [redacted]w[redacted]d, here for a NST for pregnancy complicated by [facility] and A2DM currently on Metformin.  She reports active fetal movement, contractions: none, vaginal bleeding: none, membranes: intact.   OBJECTIVE:  BP 124/75   Pulse 90   Wt (!) 320 lb (145.2 kg)   LMP 01/18/2019   BMI 51.65 kg/m   Appears well, no apparent distress  Results for orders placed or performed in visit on 09/19/19 (from the past 24 hour(s))  POC Urinalysis Dipstick OB   Collection Time: 09/19/19 10:10 AM  Result Value Ref Range   Color, UA     Clarity, UA     Glucose, UA Negative Negative   Bilirubin, UA     Ketones, UA neg    Spec Grav, UA     Blood, UA neg    pH, UA     POC,PROTEIN,UA Negative Negative, Trace, Small (1+), Moderate (2+), Large (3+), 4+   Urobilinogen, UA     Nitrite, UA neg    Leukocytes, UA Negative Negative   Appearance     Odor      NST: FHR baseline 130 bpm, Variability: moderate, Accelerations:present, Decelerations:  Absent= Cat 1/Reactive Toco: none   ASSESSMENT: G2P0010 at [redacted]w[redacted]d with CHTN and A2DM currently on Metformin NST reactive  PLAN: EFM strip reviewed by Cathie Beams, CNM   Recommendations: keep next appointment as scheduled    Jobe Marker  09/19/2019 12:18 PM

## 2019-09-20 ENCOUNTER — Other Ambulatory Visit: Payer: Self-pay | Admitting: Women's Health

## 2019-09-20 DIAGNOSIS — O24419 Gestational diabetes mellitus in pregnancy, unspecified control: Secondary | ICD-10-CM

## 2019-09-20 DIAGNOSIS — O10919 Unspecified pre-existing hypertension complicating pregnancy, unspecified trimester: Secondary | ICD-10-CM

## 2019-09-24 ENCOUNTER — Encounter: Payer: Self-pay | Admitting: Obstetrics & Gynecology

## 2019-09-24 ENCOUNTER — Other Ambulatory Visit: Payer: Self-pay

## 2019-09-24 ENCOUNTER — Ambulatory Visit (INDEPENDENT_AMBULATORY_CARE_PROVIDER_SITE_OTHER): Payer: Medicaid Other

## 2019-09-24 ENCOUNTER — Ambulatory Visit (INDEPENDENT_AMBULATORY_CARE_PROVIDER_SITE_OTHER): Payer: Medicaid Other | Admitting: Obstetrics & Gynecology

## 2019-09-24 VITALS — BP 134/85 | HR 88 | Wt 315.0 lb

## 2019-09-24 DIAGNOSIS — Z3A35 35 weeks gestation of pregnancy: Secondary | ICD-10-CM

## 2019-09-24 DIAGNOSIS — Z1389 Encounter for screening for other disorder: Secondary | ICD-10-CM

## 2019-09-24 DIAGNOSIS — O24419 Gestational diabetes mellitus in pregnancy, unspecified control: Secondary | ICD-10-CM | POA: Diagnosis not present

## 2019-09-24 DIAGNOSIS — O0990 Supervision of high risk pregnancy, unspecified, unspecified trimester: Secondary | ICD-10-CM

## 2019-09-24 DIAGNOSIS — O0993 Supervision of high risk pregnancy, unspecified, third trimester: Secondary | ICD-10-CM | POA: Diagnosis not present

## 2019-09-24 DIAGNOSIS — O10913 Unspecified pre-existing hypertension complicating pregnancy, third trimester: Secondary | ICD-10-CM

## 2019-09-24 DIAGNOSIS — O10919 Unspecified pre-existing hypertension complicating pregnancy, unspecified trimester: Secondary | ICD-10-CM

## 2019-09-24 DIAGNOSIS — Z331 Pregnant state, incidental: Secondary | ICD-10-CM | POA: Diagnosis not present

## 2019-09-24 LAB — POCT URINALYSIS DIPSTICK OB
Blood, UA: NEGATIVE
Glucose, UA: NEGATIVE
Nitrite, UA: NEGATIVE
POC,PROTEIN,UA: NEGATIVE

## 2019-09-24 NOTE — Progress Notes (Signed)
HIGH-RISK PREGNANCY VISIT Patient name: Cheyenne Moore MRN [id number]  Date of birth: Mar 22, 1990 Chief Complaint:   High Risk Gestation ([place] today; + nausea and upset stomach)  History of Present Illness:   Cheyenne Moore is a 29 y.o. G55P0010 female at [redacted]w[redacted]d with an Estimated Date of Delivery: 10/25/19 being seen today for ongoing management of a high-risk pregnancy complicated by [facility] and A2DM.  Today she reports no complaints.  Depression screen [facility] 2/9 07/24/2019 04/11/2019 05/03/2018 10/31/2017  Decreased Interest 0 0 0 0  Down, Depressed, Hopeless 0 0 0 0  PHQ - 2 Score 0 0 0 0  Altered sleeping 0 0 - -  Tired, decreased energy 1 0 - -  Change in appetite 0 0 - -  Feeling bad or failure about yourself  0 0 - -  Trouble concentrating 0 0 - -  Moving slowly or fidgety/restless 0 0 - -  Suicidal thoughts 0 0 - -  PHQ-9 Score 1 0 - -    Contractions: Not present. Vag. Bleeding: None.  Movement: Present. denies leaking of fluid.  Review of Systems:   Pertinent items are noted in HPI Denies abnormal vaginal discharge w/ itching/odor/irritation, headaches, visual changes, shortness of breath, chest pain, abdominal pain, severe nausea/vomiting, or problems with urination or bowel movements unless otherwise stated above. Pertinent History Reviewed:  Reviewed past medical,surgical, social, obstetrical and family history.  Reviewed problem list, medications and allergies. Physical Assessment:   Vitals:   09/24/19 1028  BP: 134/85  Pulse: 88  Weight: (!) 315 lb (142.9 kg)  Body mass index is 50.84 kg/m.           Physical Examination:   General appearance: alert, well appearing, and in no distress  Mental status: alert, oriented to person, place, and time  Skin: warm & dry   Extremities: Edema: Trace    Cardiovascular: normal heart rate noted  Respiratory: normal respiratory effort, no distress  Abdomen: gravid, soft, non-tender  Pelvic: Cervical exam deferred         Fetal  Status:     Movement: Present    Fetal Surveillance Testing today: BPP 8/8 with normal Dopplers   Chaperone: n/a    Results for orders placed or performed in visit on 09/24/19 (from the past 24 hour(s))  POC Urinalysis Dipstick OB   Collection Time: 09/24/19 10:30 AM  Result Value Ref Range   Color, UA     Clarity, UA     Glucose, UA Negative Negative   Bilirubin, UA     Ketones, UA 2+    Spec Grav, UA     Blood, UA neg    pH, UA     POC,PROTEIN,UA Negative Negative, Trace, Small (1+), Moderate (2+), Large (3+), 4+   Urobilinogen, UA     Nitrite, UA neg    Leukocytes, UA Trace (A) Negative   Appearance     Odor      Assessment & Plan:  1) High-risk pregnancy G2P0010 at [redacted]w[redacted]d with an Estimated Date of Delivery: 10/25/19   2) A2 DM, will change her timing of her metformin to bedtime and increase to 1000 mg  3) CHTN, stable on labetalol 200 mg daily, normal surveillance  Meds: No orders of the defined types were placed in this encounter.   Labs/procedures today: BPP  Treatment Plan:  Twice weekly surveillance, sonogram alternating with NST, induction at 39 weeks or as clinically indicated  Reviewed: Preterm labor symptoms and general  obstetric precautions including but not limited to vaginal bleeding, contractions, leaking of fluid and fetal movement were reviewed in detail with the patient.  All questions were answered. Has home bp cuff. Rx faxed to . Check bp weekly, let us know if >140/90.   Follow-up: Return in about 3 days (around 09/27/2019) for NST, Nurse only.  Orders Placed This Encounter  Procedures  . POC Urinalysis Dipstick OB   Lazaro Arms  09/24/2019 10:49 AM

## 2019-09-24 NOTE — Progress Notes (Signed)
[place] 35+4 wks,cephalic,anterior placenta gr 3,BPP 8/8,AFI 15.4 cm,fhr 127 bpm,RI .49,.53,.56=38%,EFW 3033 g 81%

## 2019-09-26 ENCOUNTER — Other Ambulatory Visit: Payer: Medicaid Other

## 2019-09-27 ENCOUNTER — Other Ambulatory Visit: Payer: Self-pay

## 2019-09-27 ENCOUNTER — Ambulatory Visit (INDEPENDENT_AMBULATORY_CARE_PROVIDER_SITE_OTHER): Payer: Medicaid Other | Admitting: *Deleted

## 2019-09-27 VITALS — BP 146/92 | HR 90 | Wt 315.0 lb

## 2019-09-27 DIAGNOSIS — O10919 Unspecified pre-existing hypertension complicating pregnancy, unspecified trimester: Secondary | ICD-10-CM

## 2019-09-27 DIAGNOSIS — Z3A36 36 weeks gestation of pregnancy: Secondary | ICD-10-CM | POA: Diagnosis not present

## 2019-09-27 DIAGNOSIS — Z1389 Encounter for screening for other disorder: Secondary | ICD-10-CM

## 2019-09-27 DIAGNOSIS — Z331 Pregnant state, incidental: Secondary | ICD-10-CM

## 2019-09-27 DIAGNOSIS — O0990 Supervision of high risk pregnancy, unspecified, unspecified trimester: Secondary | ICD-10-CM | POA: Diagnosis not present

## 2019-09-27 DIAGNOSIS — O24419 Gestational diabetes mellitus in pregnancy, unspecified control: Secondary | ICD-10-CM | POA: Diagnosis not present

## 2019-09-27 LAB — POCT URINALYSIS DIPSTICK OB
Blood, UA: NEGATIVE
Glucose, UA: NEGATIVE
Leukocytes, UA: NEGATIVE
Nitrite, UA: NEGATIVE
POC,PROTEIN,UA: NEGATIVE

## 2019-09-27 NOTE — Progress Notes (Addendum)
   NURSE VISIT- NST  SUBJECTIVE:  Cheyenne Moore is a 29 y.o. G2P0010 female at [redacted]w[redacted]d, here for a NST for pregnancy complicated by [facility] and A2DM currently on Metformin.  She reports active fetal movement, contractions: none, vaginal bleeding: none, membranes: intact.   OBJECTIVE:  BP (!) 146/92   Pulse 90   Wt (!) 315 lb (142.9 kg)   LMP 01/18/2019   BMI 50.84 kg/m   Appears well, no apparent distress  Results for orders placed or performed in visit on 09/27/19 (from the past 24 hour(s))  POC Urinalysis Dipstick OB   Collection Time: 09/27/19 10:02 AM  Result Value Ref Range   Color, UA     Clarity, UA     Glucose, UA Negative Negative   Bilirubin, UA     Ketones, UA large    Spec Grav, UA     Blood, UA n    pH, UA     POC,PROTEIN,UA Negative Negative, Trace, Small (1+), Moderate (2+), Large (3+), 4+   Urobilinogen, UA     Nitrite, UA n    Leukocytes, UA Negative Negative   Appearance     Odor      NST: FHR baseline 130 bpm, Variability: moderate, Accelerations:present, Decelerations:  Absent= Cat 1/Reactive Toco: none   ASSESSMENT: G2P0010 at [redacted]w[redacted]d with CHTN and A2DM currently on Metformin NST reactive  PLAN: EFM strip reviewed by Dr. Despina Hidden   Recommendations: keep next appointment as scheduled    Jobe Marker  09/27/2019 10:47 AM  Attestation of Attending Supervision of Nursing Visit Encounter: Evaluation and management procedures were performed by the nursing staff under my supervision and collaboration.  I have reviewed the nurse's note and chart, and I agree with the management and plan.  Rockne Coons MD Attending Physician for the Center for [facility] 10/28/2019 3:43 PM

## 2019-09-30 ENCOUNTER — Other Ambulatory Visit: Payer: Medicaid Other

## 2019-09-30 ENCOUNTER — Encounter: Payer: Medicaid Other | Admitting: Obstetrics & Gynecology

## 2019-10-02 ENCOUNTER — Other Ambulatory Visit: Payer: Self-pay | Admitting: Obstetrics & Gynecology

## 2019-10-02 DIAGNOSIS — O24419 Gestational diabetes mellitus in pregnancy, unspecified control: Secondary | ICD-10-CM

## 2019-10-02 DIAGNOSIS — O10919 Unspecified pre-existing hypertension complicating pregnancy, unspecified trimester: Secondary | ICD-10-CM

## 2019-10-03 ENCOUNTER — Ambulatory Visit (INDEPENDENT_AMBULATORY_CARE_PROVIDER_SITE_OTHER): Payer: Medicaid Other

## 2019-10-03 ENCOUNTER — Other Ambulatory Visit: Payer: Self-pay

## 2019-10-03 ENCOUNTER — Encounter: Payer: Self-pay | Admitting: Obstetrics & Gynecology

## 2019-10-03 ENCOUNTER — Ambulatory Visit (INDEPENDENT_AMBULATORY_CARE_PROVIDER_SITE_OTHER): Payer: Medicaid Other | Admitting: Obstetrics & Gynecology

## 2019-10-03 ENCOUNTER — Other Ambulatory Visit (HOSPITAL_COMMUNITY)
Admission: RE | Admit: 2019-10-03 | Discharge: 2019-10-03 | Disposition: A | Discharge status: Home / Self Care | Payer: Medicaid Other | Source: Ambulatory Visit | Attending: Obstetrics & Gynecology | Admitting: Obstetrics & Gynecology

## 2019-10-03 VITALS — BP 122/86 | HR 87 | Wt 321.0 lb

## 2019-10-03 DIAGNOSIS — Z331 Pregnant state, incidental: Secondary | ICD-10-CM

## 2019-10-03 DIAGNOSIS — Z1389 Encounter for screening for other disorder: Secondary | ICD-10-CM | POA: Diagnosis not present

## 2019-10-03 DIAGNOSIS — O0990 Supervision of high risk pregnancy, unspecified, unspecified trimester: Secondary | ICD-10-CM

## 2019-10-03 DIAGNOSIS — O10919 Unspecified pre-existing hypertension complicating pregnancy, unspecified trimester: Secondary | ICD-10-CM

## 2019-10-03 DIAGNOSIS — O10913 Unspecified pre-existing hypertension complicating pregnancy, third trimester: Secondary | ICD-10-CM

## 2019-10-03 DIAGNOSIS — Z3A36 36 weeks gestation of pregnancy: Secondary | ICD-10-CM

## 2019-10-03 DIAGNOSIS — O24419 Gestational diabetes mellitus in pregnancy, unspecified control: Secondary | ICD-10-CM | POA: Diagnosis not present

## 2019-10-03 LAB — POCT URINALYSIS DIPSTICK OB
Blood, UA: NEGATIVE
Glucose, UA: NEGATIVE
Ketones, UA: NEGATIVE
Leukocytes, UA: NEGATIVE
Nitrite, UA: NEGATIVE

## 2019-10-03 NOTE — Progress Notes (Signed)
[place] 36+6 wks,cephalic,anterior placenta gr 3,BPP 8/8,AFI 18 cm,fhr 129 bpm,RI .49,.65,.53=49%

## 2019-10-03 NOTE — Progress Notes (Signed)
HIGH-RISK PREGNANCY VISIT Patient name: Cheyenne Moore MRN [id number]  Date of birth: 1990-11-27 Chief Complaint:   Routine Prenatal Visit ([place] and cultures)  History of Present Illness:   Cheyenne Moore is a 29 y.o. G1P0010 female at [redacted]w[redacted]d with an Estimated Date of Delivery: 10/25/19 being seen today for ongoing management of a high-risk pregnancy complicated by Class A2 DM and CHTN.  Today she reports no complaints.  Depression screen [facility] 2/9 07/24/2019 04/11/2019 05/03/2018 10/31/2017  Decreased Interest 0 0 0 0  Down, Depressed, Hopeless 0 0 0 0  PHQ - 2 Score 0 0 0 0  Altered sleeping 0 0 - -  Tired, decreased energy 1 0 - -  Change in appetite 0 0 - -  Feeling bad or failure about yourself  0 0 - -  Trouble concentrating 0 0 - -  Moving slowly or fidgety/restless 0 0 - -  Suicidal thoughts 0 0 - -  PHQ-9 Score 1 0 - -    Contractions: Irritability. Vag. Bleeding: None.  Movement: Present. denies leaking of fluid.  Review of Systems:   Pertinent items are noted in HPI Denies abnormal vaginal discharge w/ itching/odor/irritation, headaches, visual changes, shortness of breath, chest pain, abdominal pain, severe nausea/vomiting, or problems with urination or bowel movements unless otherwise stated above. Pertinent History Reviewed:  Reviewed past medical,surgical, social, obstetrical and family history.  Reviewed problem list, medications and allergies. Physical Assessment:   Vitals:   10/03/19 1219  BP: 122/86  Pulse: 87  Weight: (!) 321 lb (145.6 kg)  Body mass index is 51.81 kg/m.           Physical Examination:   General appearance: alert, well appearing, and in no distress  Mental status: alert, oriented to person, place, and time  Skin: warm & dry   Extremities: Edema: Trace    Cardiovascular: normal heart rate noted  Respiratory: normal respiratory effort, no distress  Abdomen: gravid, soft, non-tender  Pelvic: Cervical exam performed  Dilation: Closed  Effacement (%): Thick Station: -3  Fetal Status: Fetal Heart Rate (bpm): 129   Movement: Present Presentation: Vertex  Fetal Surveillance Testing today: BPP 8/8 Dopplers 40%   Chaperone: Amanda Rash    Results for orders placed or performed in visit on 10/03/19 (from the past 24 hour(s))  POC Urinalysis Dipstick OB   Collection Time: 10/03/19 12:21 PM  Result Value Ref Range   Color, UA     Clarity, UA     Glucose, UA Negative Negative   Bilirubin, UA     Ketones, UA neg    Spec Grav, UA     Blood, UA neg    pH, UA     POC,PROTEIN,UA Trace Negative, Trace, Small (1+), Moderate (2+), Large (3+), 4+   Urobilinogen, UA     Nitrite, UA neg    Leukocytes, UA Negative Negative   Appearance     Odor      Assessment & Plan:  1) High-risk pregnancy G2P0010 at [redacted]w[redacted]d with an Estimated Date of Delivery: 10/25/19   2) Class A2, CBG are good 1000 mg daily metformin  3) CHTN, stable, labetalol 200 BID  Meds: No orders of the defined types were placed in this encounter.   Labs/procedures today: BPP 8/8 with normal Dopplers  Treatment Plan:  Twice weekly surveillance  Reviewed: Preterm labor symptoms and general obstetric precautions including but not limited to vaginal bleeding, contractions, leaking of fluid and fetal movement were reviewed in  detail with the patient.  All questions were answered.  home bp cuff. Rx faxed to  Check bp weekly, let us know if >140/90.   Follow-up: Return in about 4 days (around 10/07/2019) for NST, Nurse only.  Orders Placed This Encounter  Procedures  . Culture, beta strep (group b only)  . POC Urinalysis Dipstick OB   Lazaro Arms  10/03/2019 12:36 PM

## 2019-10-04 ENCOUNTER — Other Ambulatory Visit: Payer: Self-pay | Admitting: Obstetrics & Gynecology

## 2019-10-04 DIAGNOSIS — O10919 Unspecified pre-existing hypertension complicating pregnancy, unspecified trimester: Secondary | ICD-10-CM

## 2019-10-04 DIAGNOSIS — O24419 Gestational diabetes mellitus in pregnancy, unspecified control: Secondary | ICD-10-CM

## 2019-10-04 LAB — CERVICOVAGINAL ANCILLARY ONLY
Chlamydia: NEGATIVE
Comment: NEGATIVE
Comment: NORMAL
Neisseria Gonorrhea: NEGATIVE

## 2019-10-06 LAB — CULTURE, BETA STREP (GROUP B ONLY): Strep Gp B Culture: POSITIVE — AB

## 2019-10-07 ENCOUNTER — Ambulatory Visit (INDEPENDENT_AMBULATORY_CARE_PROVIDER_SITE_OTHER): Payer: Medicaid Other

## 2019-10-07 ENCOUNTER — Ambulatory Visit (INDEPENDENT_AMBULATORY_CARE_PROVIDER_SITE_OTHER): Payer: Medicaid Other | Admitting: Women's Health

## 2019-10-07 ENCOUNTER — Encounter: Payer: Self-pay | Admitting: Women's Health

## 2019-10-07 VITALS — BP 130/82 | HR 86 | Wt 320.2 lb

## 2019-10-07 DIAGNOSIS — Z331 Pregnant state, incidental: Secondary | ICD-10-CM

## 2019-10-07 DIAGNOSIS — Z3A37 37 weeks gestation of pregnancy: Secondary | ICD-10-CM

## 2019-10-07 DIAGNOSIS — O10913 Unspecified pre-existing hypertension complicating pregnancy, third trimester: Secondary | ICD-10-CM | POA: Diagnosis not present

## 2019-10-07 DIAGNOSIS — O0990 Supervision of high risk pregnancy, unspecified, unspecified trimester: Secondary | ICD-10-CM

## 2019-10-07 DIAGNOSIS — O24419 Gestational diabetes mellitus in pregnancy, unspecified control: Secondary | ICD-10-CM

## 2019-10-07 DIAGNOSIS — O10919 Unspecified pre-existing hypertension complicating pregnancy, unspecified trimester: Secondary | ICD-10-CM

## 2019-10-07 DIAGNOSIS — Z1389 Encounter for screening for other disorder: Secondary | ICD-10-CM

## 2019-10-07 DIAGNOSIS — O0993 Supervision of high risk pregnancy, unspecified, third trimester: Secondary | ICD-10-CM

## 2019-10-07 LAB — POCT URINALYSIS DIPSTICK OB
Blood, UA: NEGATIVE
Glucose, UA: NEGATIVE
Ketones, UA: NEGATIVE
Leukocytes, UA: NEGATIVE
Nitrite, UA: NEGATIVE
POC,PROTEIN,UA: NEGATIVE

## 2019-10-07 MED ORDER — METFORMIN HCL 1000 MG PO TABS: 1000.0000 mg | ORAL_TABLET | Freq: Every day | ORAL | 0 refills | Status: DC

## 2019-10-07 NOTE — Progress Notes (Signed)
[place] 37+3 wks,cephalic,anterior placenta gr 3,fhr 150 bpm,afi 14 cm,RI .57,.47,.61=47%

## 2019-10-07 NOTE — Progress Notes (Signed)
HIGH-RISK PREGNANCY VISIT Patient name: Cheyenne Moore MRN [id number]  Date of birth: February 28, 1990 Chief Complaint:   Routine Prenatal Visit (u/s / refill meformin 1000 mg/ having tingling in fingers)  History of Present Illness:   Cheyenne Moore is a 29 y.o. G37P0010 female at [redacted]w[redacted]d with an Estimated Date of Delivery: 10/25/19 being seen today for ongoing management of a high-risk pregnancy complicated by chronic hypertension currently on Labetalol 200mg  BID and diabetes mellitus A2DM currently on metformin 1,000mg  PM.  Today she reports FBS all normal but 1 (110 this am, ate [employer] last night), 2hr pp all normal but 1 (130 last night, ate [employer]).  Depression screen [facility] 2/9 07/24/2019 04/11/2019 05/03/2018 10/31/2017  Decreased Interest 0 0 0 0  Down, Depressed, Hopeless 0 0 0 0  PHQ - 2 Score 0 0 0 0  Altered sleeping 0 0 - -  Tired, decreased energy 1 0 - -  Change in appetite 0 0 - -  Feeling bad or failure about yourself  0 0 - -  Trouble concentrating 0 0 - -  Moving slowly or fidgety/restless 0 0 - -  Suicidal thoughts 0 0 - -  PHQ-9 Score 1 0 - -    Contractions: Not present.  .  Movement: Present. denies leaking of fluid.  Review of Systems:   Pertinent items are noted in HPI Denies abnormal vaginal discharge w/ itching/odor/irritation, headaches, visual changes, shortness of breath, chest pain, abdominal pain, severe nausea/vomiting, or problems with urination or bowel movements unless otherwise stated above. Pertinent History Reviewed:  Reviewed past medical,surgical, social, obstetrical and family history.  Reviewed problem list, medications and allergies. Physical Assessment:   Vitals:   10/07/19 1036  BP: 130/82  Pulse: 86  Weight: (!) 320 lb 3.2 oz (145.2 kg)  Body mass index is 51.68 kg/m.           Physical Examination:   General appearance: alert, well appearing, and in no distress  Mental status: alert, oriented to person, place, and time  Skin: warm & dry    Extremities: Edema: Trace    Cardiovascular: normal heart rate noted  Respiratory: normal respiratory effort, no distress  Abdomen: gravid, soft, non-tender  Pelvic: Cervical exam deferred         Fetal Status: Fetal Heart Rate (bpm): 150 u/s   Movement: Present Presentation: Vertex  Fetal Surveillance Testing today: 10/09/19 37+3 wks,cephalic,anterior placenta gr 3,fhr 150 bpm,afi 14 cm,RI .57,.47,.61=47%,BPP 8/8  Chaperone: n/a    Results for orders placed or performed in visit on 10/07/19 (from the past 24 hour(s))  POC Urinalysis Dipstick OB   Collection Time: 10/07/19 10:45 AM  Result Value Ref Range   Color, UA     Clarity, UA     Glucose, UA Negative Negative   Bilirubin, UA     Ketones, UA neg    Spec Grav, UA     Blood, UA neg    pH, UA     POC,PROTEIN,UA Negative Negative, Trace, Small (1+), Moderate (2+), Large (3+), 4+   Urobilinogen, UA     Nitrite, UA neg    Leukocytes, UA Negative Negative   Appearance     Odor      Assessment & Plan:  1) High-risk pregnancy G2P0010 at [redacted]w[redacted]d with an Estimated Date of Delivery: 10/25/19   2) CHTN, stable on Labetalol 200mg  BID, ASA, reviewed pre-e s/s, reasons to seek care  3) A2DM, stable on metformin 1,000mg  PM, refilled today.  EFW 47% today  4) Hypothyroidism> stble on NP thyroid 120mg , last TSH 1.390 on 8/9  Meds:  Meds ordered this encounter  Medications  . metFORMIN (GLUCOPHAGE) 1000 MG tablet    Sig: Take 1 tablet (1,000 mg total) by mouth daily with supper.    Dispense:  30 tablet    Refill:  0    Order Specific Question:   Supervising Provider    Answer:   10/9 H [[id number]]    Labs/procedures today: u/s  Treatment Plan:  2x/wk testing nst alt w/ bpp/dopp, IOL @ 39wks  Reviewed: Term labor symptoms and general obstetric precautions including but not limited to vaginal bleeding, contractions, leaking of fluid and fetal movement were reviewed in detail with the patient.  All questions were answered.    Follow-up: Return for As scheduled thurs nst/nurse, then mon bpp/dopp (send msg to tish to see if can do in [place]) and hrob.  Orders Placed This Encounter  Procedures  . Duane Lope FETAL BPP WO NON STRESS  . [place] UA Cord Doppler  . POC Urinalysis Dipstick OB   [place] CNM, [facility] 10/07/2019 11:16 AM

## 2019-10-07 NOTE — Patient Instructions (Signed)
Yvetta Coder, I greatly value your feedback.  If you receive a survey following your visit with [place] today, we appreciate you taking the time to fill it out.  Thanks, Joellyn Haff, CNM, WHNP-BC  [facility] ([address] [place], [place] [address]) Entrance C, located off of [address] valet parking   Go to [email or web address] to register for FREE online childbirth classes    Call the office [phone]) or go to [facility] if:  You begin to have strong, frequent contractions  Your water breaks.  Sometimes it is a big gush of fluid, sometimes it is just a trickle that keeps getting your panties wet or running down your legs  You have vaginal bleeding.  It is normal to have a small amount of spotting if your cervix was checked.   You don't feel your baby moving like normal.  If you don't, get you something to eat and drink and lay down and focus on feeling your baby move.  You should feel at least 10 movements in 2 hours.  If you don't, you should call the office or go to [facility].   Call the office [phone]) or go to [facility] hospital for these signs of pre-eclampsia:  Severe headache that does not go away with Tylenol  Visual changes- seeing spots, double, blurred vision  Pain under your right breast or upper abdomen that does not go away with Tums or heartburn medicine  Nausea and/or vomiting  Severe swelling in your hands, feet, and face    Home Blood Pressure Monitoring for Patients   Your provider has recommended that you check your blood pressure (BP) at least once a week at home. If you do not have a blood pressure cuff at home, one will be provided for you. Contact your provider if you have not received your monitor within 1 week.   Helpful Tips for Accurate Home Blood Pressure Checks  . Don't smoke, exercise, or drink caffeine 30 minutes before checking your BP . Use the restroom before checking your BP (a full  bladder can raise your pressure) . Relax in a comfortable upright chair . Feet on the ground . Left arm resting comfortably on a flat surface at the level of your heart . Legs uncrossed . Back supported . Sit quietly and don't talk . Place the cuff on your bare arm . Adjust snuggly, so that only two fingertips can fit between your skin and the top of the cuff . Check 2 readings separated by at least one minute . Keep a log of your BP readings . For a visual, please reference this diagram: http://ccnc.care/bpdiagram  Provider Name: [facility]     Phone: [phone]  Zone 1: ALL CLEAR  Continue to monitor your symptoms:  . BP reading is less than 140 (top number) or less than 90 (bottom number)  . No right upper stomach pain . No headaches or seeing spots . No feeling nauseated or throwing up . No swelling in face and hands  Zone 2: CAUTION Call your doctor's office for any of the following:  . BP reading is greater than 140 (top number) or greater than 90 (bottom number)  . Stomach pain under your ribs in the middle or right side . Headaches or seeing spots . Feeling nauseated or throwing up . Swelling in face and hands  Zone 3: EMERGENCY  Seek immediate medical care if you have any of  the following:  . BP reading is greater than160 (top number) or greater than 110 (bottom number) . Severe headaches not improving with Tylenol . Serious difficulty catching your breath . Any worsening symptoms from Zone 2   Braxton Hicks Contractions Contractions of the uterus can occur throughout pregnancy, but they are not always a sign that you are in labor. You may have practice contractions called Braxton Hicks contractions. These false labor contractions are sometimes confused with true labor. What are Braxton Hicks contractions? Braxton Hicks contractions are tightening movements that occur in the muscles of the uterus before labor. Unlike true labor contractions, these  contractions do not result in opening (dilation) and thinning of the cervix. Toward the end of pregnancy (32-34 weeks), Braxton Hicks contractions can happen more often and may become stronger. These contractions are sometimes difficult to tell apart from true labor because they can be very uncomfortable. You should not feel embarrassed if you go to the hospital with false labor. Sometimes, the only way to tell if you are in true labor is for your health care provider to look for changes in the cervix. The health care provider will do a physical exam and may monitor your contractions. If you are not in true labor, the exam should show that your cervix is not dilating and your water has not broken. If there are no other health problems associated with your pregnancy, it is completely safe for you to be sent home with false labor. You may continue to have Braxton Hicks contractions until you go into true labor. How to tell the difference between true labor and false labor True labor  Contractions last 30-70 seconds.  Contractions become very regular.  Discomfort is usually felt in the top of the uterus, and it spreads to the lower abdomen and low back.  Contractions do not go away with walking.  Contractions usually become more intense and increase in frequency.  The cervix dilates and gets thinner. False labor  Contractions are usually shorter and not as strong as true labor contractions.  Contractions are usually irregular.  Contractions are often felt in the front of the lower abdomen and in the groin.  Contractions may go away when you walk around or change positions while lying down.  Contractions get weaker and are shorter-lasting as time goes on.  The cervix usually does not dilate or become thin. Follow these instructions at home:  1. Take over-the-counter and prescription medicines only as told by your health care provider. 2. Keep up with your usual exercises and follow other  instructions from your health care provider. 3. Eat and drink lightly if you think you are going into labor. 4. If Braxton Hicks contractions are making you uncomfortable: ? Change your position from lying down or resting to walking, or change from walking to resting. ? Sit and rest in a tub of warm water. ? Drink enough fluid to keep your urine pale yellow. Dehydration may cause these contractions. ? Do slow and deep breathing several times an hour. 5. Keep all follow-up prenatal visits as told by your health care provider. This is important. Contact a health care provider if:  You have a fever.  You have continuous pain in your abdomen. Get help right away if:  Your contractions become stronger, more regular, and closer together.  You have fluid leaking or gushing from your vagina.  You pass blood-tinged mucus (bloody show).  You have bleeding from your vagina.  You have low back   pain that you never had before.  You feel your baby's head pushing down and causing pelvic pressure.  Your baby is not moving inside you as much as it used to. Summary  Contractions that occur before labor are called Braxton Hicks contractions, false labor, or practice contractions.  Braxton Hicks contractions are usually shorter, weaker, farther apart, and less regular than true labor contractions. True labor contractions usually become progressively stronger and regular, and they become more frequent.  Manage discomfort from [facility] contractions by changing position, resting in a warm bath, drinking plenty of water, or practicing deep breathing. This information is not intended to replace advice given to you by your health care provider. Make sure you discuss any questions you have with your health care provider. Document Revised: 12/16/2016 Document Reviewed: 05/19/2016 Elsevier Patient Education  [place].

## 2019-10-08 ENCOUNTER — Encounter: Payer: Self-pay | Admitting: *Deleted

## 2019-10-10 ENCOUNTER — Ambulatory Visit (INDEPENDENT_AMBULATORY_CARE_PROVIDER_SITE_OTHER): Payer: Medicaid Other | Admitting: *Deleted

## 2019-10-10 VITALS — BP 140/88 | HR 78 | Wt 321.2 lb

## 2019-10-10 DIAGNOSIS — O10919 Unspecified pre-existing hypertension complicating pregnancy, unspecified trimester: Secondary | ICD-10-CM | POA: Diagnosis not present

## 2019-10-10 DIAGNOSIS — Z1389 Encounter for screening for other disorder: Secondary | ICD-10-CM | POA: Diagnosis not present

## 2019-10-10 DIAGNOSIS — O24419 Gestational diabetes mellitus in pregnancy, unspecified control: Secondary | ICD-10-CM

## 2019-10-10 DIAGNOSIS — Z3A38 38 weeks gestation of pregnancy: Secondary | ICD-10-CM

## 2019-10-10 DIAGNOSIS — O288 Other abnormal findings on antenatal screening of mother: Secondary | ICD-10-CM

## 2019-10-10 DIAGNOSIS — O24415 Gestational diabetes mellitus in pregnancy, controlled by oral hypoglycemic drugs: Secondary | ICD-10-CM

## 2019-10-10 DIAGNOSIS — O0990 Supervision of high risk pregnancy, unspecified, unspecified trimester: Secondary | ICD-10-CM

## 2019-10-10 DIAGNOSIS — Z331 Pregnant state, incidental: Secondary | ICD-10-CM

## 2019-10-10 LAB — POCT URINALYSIS DIPSTICK OB
Blood, UA: NEGATIVE
Glucose, UA: NEGATIVE
Leukocytes, UA: NEGATIVE
Nitrite, UA: NEGATIVE
POC,PROTEIN,UA: NEGATIVE

## 2019-10-11 ENCOUNTER — Other Ambulatory Visit: Payer: Self-pay | Admitting: Obstetrics and Gynecology

## 2019-10-11 NOTE — Progress Notes (Signed)
Late entry for 10/10/19.    NURSE VISIT- NST  SUBJECTIVE:  Cheyenne Moore is a 29 y.o. G2P0010 female at [redacted]w[redacted]d, here for a NST for pregnancy complicated by [facility] and A2DM currently on metformin.  She reports active fetal movement, contractions: none, vaginal bleeding: none, membranes: intact.   OBJECTIVE:  BP 140/88   Pulse 78   Wt (!) 321 lb 3.2 oz (145.7 kg)   LMP 01/18/2019   BMI 51.84 kg/m   Appears well, no apparent distress  Results for orders placed or performed in visit on 10/10/19 (from the past 24 hour(s))  POC Urinalysis Dipstick OB   Collection Time: 10/10/19 10:05 AM  Result Value Ref Range   Color, UA     Clarity, UA     Glucose, UA Negative Negative   Bilirubin, UA     Ketones, UA small    Spec Grav, UA     Blood, UA neg    pH, UA     POC,PROTEIN,UA Negative Negative, Trace, Small (1+), Moderate (2+), Large (3+), 4+   Urobilinogen, UA     Nitrite, UA neg    Leukocytes, UA Negative Negative   Appearance     Odor      NST: FHR baseline 144 bpm, Variability: moderate, Accelerations:present, Decelerations:  Absent= Cat 1/Reactive Toco: none   ASSESSMENT: G2P0010 at [redacted]w[redacted]d with CHTN and A2DM currently on metformin NST reactive  PLAN: EFM strip reviewed by Cathie Beams, CNM   Recommendations: keep next appointment as scheduled    Annamarie Dawley  10/11/2019 8:34 AM

## 2019-10-12 NOTE — Telephone Encounter (Signed)
Omeprazole refilled x a year.

## 2019-10-14 ENCOUNTER — Telehealth (HOSPITAL_COMMUNITY): Payer: Self-pay | Admitting: *Deleted

## 2019-10-14 ENCOUNTER — Ambulatory Visit (INDEPENDENT_AMBULATORY_CARE_PROVIDER_SITE_OTHER): Payer: Medicaid Other | Admitting: Advanced Practice Midwife

## 2019-10-14 ENCOUNTER — Other Ambulatory Visit: Payer: Medicaid Other

## 2019-10-14 ENCOUNTER — Encounter: Payer: Medicaid Other | Admitting: Obstetrics and Gynecology

## 2019-10-14 VITALS — BP 137/91 | HR 89 | Wt 319.0 lb

## 2019-10-14 DIAGNOSIS — O24415 Gestational diabetes mellitus in pregnancy, controlled by oral hypoglycemic drugs: Secondary | ICD-10-CM

## 2019-10-14 DIAGNOSIS — Z1389 Encounter for screening for other disorder: Secondary | ICD-10-CM

## 2019-10-14 DIAGNOSIS — Z79899 Other long term (current) drug therapy: Secondary | ICD-10-CM

## 2019-10-14 DIAGNOSIS — O10913 Unspecified pre-existing hypertension complicating pregnancy, third trimester: Secondary | ICD-10-CM

## 2019-10-14 DIAGNOSIS — Z331 Pregnant state, incidental: Secondary | ICD-10-CM

## 2019-10-14 DIAGNOSIS — Z3A38 38 weeks gestation of pregnancy: Secondary | ICD-10-CM

## 2019-10-14 DIAGNOSIS — O0990 Supervision of high risk pregnancy, unspecified, unspecified trimester: Secondary | ICD-10-CM

## 2019-10-14 LAB — POCT URINALYSIS DIPSTICK OB
Blood, UA: NEGATIVE
Glucose, UA: NEGATIVE
Ketones, UA: NEGATIVE
Nitrite, UA: NEGATIVE
POC,PROTEIN,UA: NEGATIVE

## 2019-10-14 NOTE — Progress Notes (Signed)
HIGH-RISK PREGNANCY VISIT Patient name: Cheyenne Moore MRN [id number]  Date of birth: Mar 24, 1990 Chief Complaint:   Routine Prenatal Visit (NST)  History of Present Illness:   Cheyenne Moore is a 29 y.o. G20P0010 female at [redacted]w[redacted]d with an Estimated Date of Delivery: 10/25/19 being seen today for ongoing management of a high-risk pregnancy complicated by chronic hypertension currently on Labetalol 200mg  BID and diabetes mellitus A2DM currently on metformin 1,000mg  PM Today she reports FBS <95 and 2 hour pp <120. Contractions: Not present. Vag. Bleeding: None.  Movement: Present. denies leaking of fluid.  Review of Systems:   Pertinent items are noted in HPI Denies abnormal vaginal discharge w/ itching/odor/irritation, headaches, visual changes, shortness of breath, chest pain, abdominal pain, severe nausea/vomiting, or problems with urination or bowel movements unless otherwise stated above. Pertinent History Reviewed:  Reviewed past medical,surgical, social, obstetrical and family history.  Reviewed problem list, medications and allergies. Physical Assessment:   Vitals:   10/14/19 1031  BP: (!) 137/91  Pulse: 89  Weight: (!) 319 lb (144.7 kg)  Body mass index is 51.49 kg/m.           Physical Examination:   General appearance: alert, well appearing, and in no distress  Mental status: alert, oriented to person, place, and time  Skin: warm & dry   Extremities: Edema: Trace    Cardiovascular: normal heart rate noted  Respiratory: normal respiratory effort, no distress  Abdomen: gravid, soft, non-tender  Pelvic: Cervical exam deferred         Fetal Status:     Movement: Present    Fetal Surveillance Testing today: NST: FHR baseline 145 bpm, Variability: moderate, Accelerations:present, Decelerations:  Absent= Cat 1/Reactive    Results for orders placed or performed in visit on 10/14/19 (from the past 24 hour(s))  POC Urinalysis Dipstick OB   Collection Time: 10/14/19 10:34 AM    Result Value Ref Range   Color, UA     Clarity, UA     Glucose, UA Negative Negative   Bilirubin, UA     Ketones, UA neg    Spec Grav, UA     Blood, UA neg    pH, UA     POC,PROTEIN,UA Negative Negative, Trace, Small (1+), Moderate (2+), Large (3+), 4+   Urobilinogen, UA     Nitrite, UA neg    Leukocytes, UA Small (1+) (A) Negative   Appearance     Odor      Assessment & Plan:  1) High-risk pregnancy G2P0010 at [redacted]w[redacted]d with an Estimated Date of Delivery: 10/25/19   2) CHTN, stable on Labetalol 200mg  BID, ASA, reviewed pre-e s/s, reasons to seek care. IOL set for 10/1 at midnight  3) A2DM, stable on metformin 1,000mg  PM, refilled today. Testing per [facility] guidelines  4) Hypothyroidism> stble on NP thyroid 120mg , last TSH 1.390 on 8/9 Meds: No orders of the defined types were placed in this encounter.    Reviewed: Term labor symptoms and general obstetric precautions including but not limited to vaginal bleeding, contractions, leaking of fluid and fetal movement were reviewed in detail with the patient.  All questions were answered. Has home bp cuff. Check bp weekly, let [facility] know if >140/90.   Follow-up: No follow-ups on file.  Future Appointments  Date Time Provider Department Center  10/17/2019 10:10 AM [facility] NURSE [facility]  10/18/2019 12:00 AM [facility] SCHED ROOM [facility] None    Orders Placed This Encounter  Procedures  . POC Urinalysis  Dipstick OB   Jacklyn Shell DNP, CNM 10/14/2019 10:44 AM

## 2019-10-14 NOTE — Telephone Encounter (Signed)
Preadmission screen  

## 2019-10-14 NOTE — Patient Instructions (Signed)
If you are still pregnant on 9/30 at 11:45 pm (YES, PM!), go to [employer] [facility] [employer] at [facility], ([address] [place] in [place], [place]) to start your induction!  Eat a light meal before you come if you are hungry at all.  Daisey Must!!

## 2019-10-15 ENCOUNTER — Telehealth (HOSPITAL_COMMUNITY): Payer: Self-pay | Admitting: *Deleted

## 2019-10-15 ENCOUNTER — Encounter (HOSPITAL_COMMUNITY): Payer: Self-pay | Admitting: *Deleted

## 2019-10-15 ENCOUNTER — Other Ambulatory Visit: Payer: Self-pay | Admitting: Family Medicine

## 2019-10-15 NOTE — Telephone Encounter (Signed)
Preadmission screen  

## 2019-10-16 ENCOUNTER — Other Ambulatory Visit (HOSPITAL_COMMUNITY): Payer: Medicaid Other

## 2019-10-17 ENCOUNTER — Ambulatory Visit (INDEPENDENT_AMBULATORY_CARE_PROVIDER_SITE_OTHER): Payer: Medicaid Other | Admitting: *Deleted

## 2019-10-17 VITALS — BP 149/98 | HR 82 | Wt 324.0 lb

## 2019-10-17 DIAGNOSIS — O0990 Supervision of high risk pregnancy, unspecified, unspecified trimester: Secondary | ICD-10-CM | POA: Diagnosis not present

## 2019-10-17 DIAGNOSIS — O24419 Gestational diabetes mellitus in pregnancy, unspecified control: Secondary | ICD-10-CM

## 2019-10-17 DIAGNOSIS — Z1389 Encounter for screening for other disorder: Secondary | ICD-10-CM

## 2019-10-17 DIAGNOSIS — Z331 Pregnant state, incidental: Secondary | ICD-10-CM | POA: Diagnosis not present

## 2019-10-17 DIAGNOSIS — O10919 Unspecified pre-existing hypertension complicating pregnancy, unspecified trimester: Secondary | ICD-10-CM | POA: Diagnosis not present

## 2019-10-17 DIAGNOSIS — Z3A38 38 weeks gestation of pregnancy: Secondary | ICD-10-CM | POA: Diagnosis not present

## 2019-10-17 LAB — POCT URINALYSIS DIPSTICK OB
Blood, UA: NEGATIVE
Glucose, UA: NEGATIVE
Ketones, UA: NEGATIVE
Leukocytes, UA: NEGATIVE
Nitrite, UA: NEGATIVE
POC,PROTEIN,UA: NEGATIVE

## 2019-10-17 NOTE — Progress Notes (Addendum)
   NURSE VISIT- NST  SUBJECTIVE:  Cheyenne Moore is a 29 y.o. G2P0010 female at [redacted]w[redacted]d, here for a NST for pregnancy complicated by [facility] and A2DM currently on Metformin.  She reports active fetal movement, contractions: none, vaginal bleeding: none, membranes: intact.   OBJECTIVE:  BP (!) 149/98   Pulse 82   Wt (!) 324 lb (147 kg)   LMP 01/18/2019   BMI 52.29 kg/m   Appears well, no apparent distress Denies pre-e symptoms  Results for orders placed or performed in visit on 10/17/19 (from the past 24 hour(s))  POC Urinalysis Dipstick OB   Collection Time: 10/17/19 10:15 AM  Result Value Ref Range   Color, UA     Clarity, UA     Glucose, UA Negative Negative   Bilirubin, UA     Ketones, UA n    Spec Grav, UA     Blood, UA n    pH, UA     POC,PROTEIN,UA Negative Negative, Trace, Small (1+), Moderate (2+), Large (3+), 4+   Urobilinogen, UA     Nitrite, UA n    Leukocytes, UA Negative Negative   Appearance     Odor      NST: FHR baseline 130 bpm, Variability: moderate, Accelerations:present, Decelerations:  Absent= Cat 1/Reactive Toco: none   ASSESSMENT: G2P0010 at [redacted]w[redacted]d with CHTN and A2DM currently on Metformin NST reactive  PLAN: EFM strip reviewed by Joellyn Haff, CNM, [facility]   Recommendations: scheduled for induction tonight    Jobe Marker  10/17/2019 11:23 AM  Chart reviewed for nurse visit. Agree with plan of care.  Cheral Marker, [place] 10/17/2019 12:35 PM

## 2019-10-18 ENCOUNTER — Inpatient Hospital Stay (HOSPITAL_COMMUNITY)
Admission: AD | Admit: 2019-10-18 | Discharge: 2019-10-23 | DRG: 805 | Disposition: A | Discharge status: Home / Self Care | Payer: Medicaid Other | Attending: Obstetrics & Gynecology | Admitting: Obstetrics & Gynecology

## 2019-10-18 ENCOUNTER — Inpatient Hospital Stay (HOSPITAL_COMMUNITY): Payer: Medicaid Other

## 2019-10-18 ENCOUNTER — Other Ambulatory Visit: Payer: Self-pay

## 2019-10-18 ENCOUNTER — Encounter (HOSPITAL_COMMUNITY): Payer: Self-pay | Admitting: Family Medicine

## 2019-10-18 DIAGNOSIS — Z20822 Contact with and (suspected) exposure to covid-19: Secondary | ICD-10-CM | POA: Diagnosis present

## 2019-10-18 DIAGNOSIS — O24420 Gestational diabetes mellitus in childbirth, diet controlled: Secondary | ICD-10-CM | POA: Diagnosis not present

## 2019-10-18 DIAGNOSIS — B951 Streptococcus, group B, as the cause of diseases classified elsewhere: Secondary | ICD-10-CM | POA: Diagnosis present

## 2019-10-18 DIAGNOSIS — O99283 Endocrine, nutritional and metabolic diseases complicating pregnancy, third trimester: Secondary | ICD-10-CM | POA: Diagnosis not present

## 2019-10-18 DIAGNOSIS — O99824 Streptococcus B carrier state complicating childbirth: Secondary | ICD-10-CM | POA: Diagnosis present

## 2019-10-18 DIAGNOSIS — O24425 Gestational diabetes mellitus in childbirth, controlled by oral hypoglycemic drugs: Principal | ICD-10-CM | POA: Diagnosis present

## 2019-10-18 DIAGNOSIS — E039 Hypothyroidism, unspecified: Secondary | ICD-10-CM | POA: Diagnosis present

## 2019-10-18 DIAGNOSIS — O99214 Obesity complicating childbirth: Secondary | ICD-10-CM | POA: Diagnosis present

## 2019-10-18 DIAGNOSIS — O1002 Pre-existing essential hypertension complicating childbirth: Secondary | ICD-10-CM | POA: Diagnosis present

## 2019-10-18 DIAGNOSIS — O10913 Unspecified pre-existing hypertension complicating pregnancy, third trimester: Secondary | ICD-10-CM

## 2019-10-18 DIAGNOSIS — O24419 Gestational diabetes mellitus in pregnancy, unspecified control: Secondary | ICD-10-CM

## 2019-10-18 DIAGNOSIS — O0990 Supervision of high risk pregnancy, unspecified, unspecified trimester: Secondary | ICD-10-CM

## 2019-10-18 DIAGNOSIS — Z23 Encounter for immunization: Secondary | ICD-10-CM | POA: Diagnosis not present

## 2019-10-18 DIAGNOSIS — I10 Essential (primary) hypertension: Secondary | ICD-10-CM | POA: Diagnosis present

## 2019-10-18 DIAGNOSIS — Z7984 Long term (current) use of oral hypoglycemic drugs: Secondary | ICD-10-CM | POA: Diagnosis not present

## 2019-10-18 DIAGNOSIS — O411230 Chorioamnionitis, third trimester, not applicable or unspecified: Secondary | ICD-10-CM | POA: Diagnosis present

## 2019-10-18 DIAGNOSIS — E669 Obesity, unspecified: Secondary | ICD-10-CM | POA: Diagnosis present

## 2019-10-18 DIAGNOSIS — Z3A39 39 weeks gestation of pregnancy: Secondary | ICD-10-CM

## 2019-10-18 DIAGNOSIS — O99820 Streptococcus B carrier state complicating pregnancy: Secondary | ICD-10-CM

## 2019-10-18 DIAGNOSIS — O328XX0 Maternal care for other malpresentation of fetus, not applicable or unspecified: Secondary | ICD-10-CM | POA: Diagnosis not present

## 2019-10-18 DIAGNOSIS — O99284 Endocrine, nutritional and metabolic diseases complicating childbirth: Secondary | ICD-10-CM | POA: Diagnosis present

## 2019-10-18 DIAGNOSIS — Z8632 Personal history of gestational diabetes: Secondary | ICD-10-CM

## 2019-10-18 DIAGNOSIS — O24415 Gestational diabetes mellitus in pregnancy, controlled by oral hypoglycemic drugs: Secondary | ICD-10-CM | POA: Diagnosis not present

## 2019-10-18 LAB — CBC
HCT: 33.5 % — ABNORMAL LOW (ref 36.0–46.0)
Hemoglobin: 10.7 g/dL — ABNORMAL LOW (ref 12.0–15.0)
MCH: 27.2 pg (ref 26.0–34.0)
MCHC: 31.9 g/dL (ref 30.0–36.0)
MCV: 85.2 fL (ref 80.0–100.0)
Platelets: 290 10*3/uL (ref 150–400)
RBC: 3.93 MIL/uL (ref 3.87–5.11)
RDW: 13.9 % (ref 11.5–15.5)
WBC: 10.9 10*3/uL — ABNORMAL HIGH (ref 4.0–10.5)
nRBC: 0 % (ref 0.0–0.2)

## 2019-10-18 LAB — RESPIRATORY PANEL BY RT PCR (FLU A&B, COVID)
Influenza A by PCR: NEGATIVE
Influenza B by PCR: NEGATIVE
SARS Coronavirus 2 by RT PCR: NEGATIVE

## 2019-10-18 LAB — GLUCOSE, CAPILLARY
Glucose-Capillary: 99 mg/dL (ref 70–99)
Glucose-Capillary: 103 mg/dL — ABNORMAL HIGH (ref 70–99)
Glucose-Capillary: 96 mg/dL (ref 70–99)
Glucose-Capillary: 82 mg/dL (ref 70–99)
Glucose-Capillary: 84 mg/dL (ref 70–99)
Glucose-Capillary: 75 mg/dL (ref 70–99)

## 2019-10-18 LAB — TYPE AND SCREEN
ABO/RH(D): O POS
Antibody Screen: NEGATIVE

## 2019-10-18 LAB — RPR: RPR Ser Ql: NONREACTIVE

## 2019-10-18 MED ORDER — FLEET ENEMA 7-19 GM/118ML RE ENEM: 1.0000 | ENEMA | RECTAL | Status: DC | PRN

## 2019-10-18 MED ORDER — EPHEDRINE 5 MG/ML INJ: 10.0000 mg | INTRAVENOUS | Status: DC | PRN

## 2019-10-18 MED ORDER — PENICILLIN G POT IN DEXTROSE 60000 UNIT/ML IV SOLN
3.0000 10*6.[IU] | INTRAVENOUS | Status: DC
  Administered 2019-10-18 – 2019-10-19 (×7): 3 10*6.[IU] via INTRAVENOUS
  Filled 2019-10-18 (×7): qty 50

## 2019-10-18 MED ORDER — OXYCODONE-ACETAMINOPHEN 5-325 MG PO TABS: 2.0000 | ORAL_TABLET | ORAL | Status: DC | PRN

## 2019-10-18 MED ORDER — LABETALOL HCL 200 MG PO TABS: 200.0000 mg | ORAL_TABLET | Freq: Once | ORAL | Status: DC

## 2019-10-18 MED ORDER — MISOPROSTOL 25 MCG QUARTER TABLET
25.0000 ug | ORAL_TABLET | ORAL | Status: DC | PRN
  Administered 2019-10-18: 25 ug via VAGINAL
  Filled 2019-10-18: qty 1

## 2019-10-18 MED ORDER — PHENYLEPHRINE 40 MCG/ML (10ML) SYRINGE FOR IV PUSH (FOR BLOOD PRESSURE SUPPORT): 80.0000 ug | PREFILLED_SYRINGE | INTRAVENOUS | Status: DC | PRN

## 2019-10-18 MED ORDER — METFORMIN HCL 500 MG PO TABS
1000.0000 mg | ORAL_TABLET | Freq: Every day | ORAL | Status: DC
  Administered 2019-10-19: 1000 mg via ORAL
  Filled 2019-10-18 (×3): qty 2

## 2019-10-18 MED ORDER — LACTATED RINGERS IV SOLN
500.0000 mL | INTRAVENOUS | Status: DC | PRN
  Administered 2019-10-20: 500 mL via INTRAVENOUS

## 2019-10-18 MED ORDER — ACETAMINOPHEN 325 MG PO TABS
650.0000 mg | ORAL_TABLET | ORAL | Status: DC | PRN
  Administered 2019-10-21: 650 mg via ORAL
  Filled 2019-10-18: qty 2

## 2019-10-18 MED ORDER — LACTATED RINGERS IV SOLN
INTRAVENOUS | Status: DC
  Administered 2019-10-19: 125 mL via INTRAVENOUS

## 2019-10-18 MED ORDER — LABETALOL HCL 200 MG PO TABS
200.0000 mg | ORAL_TABLET | Freq: Two times a day (BID) | ORAL | Status: DC
  Administered 2019-10-18 – 2019-10-20 (×7): 200 mg via ORAL
  Filled 2019-10-18 (×7): qty 1

## 2019-10-18 MED ORDER — OXYTOCIN BOLUS FROM INFUSION: 333.0000 mL | Freq: Once | INTRAVENOUS | Status: DC

## 2019-10-18 MED ORDER — TERBUTALINE SULFATE 1 MG/ML IJ SOLN: 0.2500 mg | Freq: Once | INTRAMUSCULAR | Status: DC | PRN

## 2019-10-18 MED ORDER — ONDANSETRON HCL 4 MG/2ML IJ SOLN: 4.0000 mg | Freq: Four times a day (QID) | INTRAMUSCULAR | Status: DC | PRN

## 2019-10-18 MED ORDER — FENTANYL-BUPIVACAINE-NACL 0.5-0.125-0.9 MG/250ML-% EP SOLN
12.0000 mL/h | EPIDURAL | Status: DC | PRN
  Filled 2019-10-18: qty 250

## 2019-10-18 MED ORDER — LACTATED RINGERS IV SOLN
500.0000 mL | Freq: Once | INTRAVENOUS | Status: AC
  Administered 2019-10-20: 500 mL via INTRAVENOUS

## 2019-10-18 MED ORDER — MISOPROSTOL 50MCG HALF TABLET
50.0000 ug | ORAL_TABLET | ORAL | Status: DC | PRN
  Administered 2019-10-18 (×2): 50 ug via ORAL
  Filled 2019-10-18 (×2): qty 1

## 2019-10-18 MED ORDER — THYROID 120 MG PO TABS
120.0000 mg | ORAL_TABLET | Freq: Every day | ORAL | Status: DC
  Administered 2019-10-18 – 2019-10-20 (×3): 120 mg via ORAL
  Filled 2019-10-18 (×4): qty 1

## 2019-10-18 MED ORDER — SODIUM CHLORIDE 0.9 % IV SOLN
5.0000 10*6.[IU] | Freq: Once | INTRAVENOUS | Status: AC
  Administered 2019-10-18: 5 10*6.[IU] via INTRAVENOUS
  Filled 2019-10-18: qty 5

## 2019-10-18 MED ORDER — FENTANYL CITRATE (PF) 100 MCG/2ML IJ SOLN
50.0000 ug | INTRAMUSCULAR | Status: DC | PRN
  Administered 2019-10-20: 100 ug via INTRAVENOUS
  Filled 2019-10-18 (×2): qty 2

## 2019-10-18 MED ORDER — SOD CITRATE-CITRIC ACID 500-334 MG/5ML PO SOLN: 30.0000 mL | ORAL | Status: DC | PRN

## 2019-10-18 MED ORDER — OXYCODONE-ACETAMINOPHEN 5-325 MG PO TABS: 1.0000 | ORAL_TABLET | ORAL | Status: DC | PRN

## 2019-10-18 MED ORDER — LIDOCAINE HCL (PF) 1 % IJ SOLN
30.0000 mL | INTRAMUSCULAR | Status: AC | PRN
  Administered 2019-10-21: 30 mL via SUBCUTANEOUS
  Filled 2019-10-18: qty 30

## 2019-10-18 MED ORDER — OXYTOCIN-SODIUM CHLORIDE 30-0.9 UT/500ML-% IV SOLN
1.0000 m[IU]/min | INTRAVENOUS | Status: DC
  Administered 2019-10-18: 2 m[IU]/min via INTRAVENOUS
  Filled 2019-10-18: qty 500

## 2019-10-18 MED ORDER — OXYTOCIN-SODIUM CHLORIDE 30-0.9 UT/500ML-% IV SOLN: 2.5000 [IU]/h | INTRAVENOUS | Status: DC

## 2019-10-18 MED ORDER — DIPHENHYDRAMINE HCL 50 MG/ML IJ SOLN: 12.5000 mg | INTRAMUSCULAR | Status: DC | PRN

## 2019-10-18 NOTE — H&P (Addendum)
LABOR AND DELIVERY ADMISSION HISTORY AND PHYSICAL NOTE  Cheyenne Moore is a 29 y.o. female G87P0010 with IUP at 97w0dby LMP presenting for IOL for A2GDM (metformin 1000 mg qhs).   She reports positive fetal movement. She denies leakage of fluid or vaginal bleeding. Denies headaches, vision changes, dyspnea, or lower extremity edema. Contractions are mild and irreg.  Prenatal History/Complications: AC1KGY(11856mg metformin qhs) obesity (BMI 53) Hypothyroid (thyroid 120 mg qd, prepregnancy dose 90 mg) chronic HTN (labetalol 200 mg BID).   [facility]  Sono:  @[redacted]w[redacted]d , normal anatomy, cephalic presentation, anterior placenta, 3033g, 81% EFW   Past Medical History: Past Medical History:  Diagnosis Date   Allergic rhinitis    GERD (gastroesophageal reflux disease)    Gestational diabetes    metformin   Hypertension    Hypothyroidism    Thyroid disease    hypothyroid    Past Surgical History: Past Surgical History:  Procedure Laterality Date   NO PAST SURGERIES      Obstetrical History: OB History     Gravida  2   Para  0   Term  0   Preterm  0   AB  1   Living  0      SAB  1   TAB  0   Ectopic  0   Multiple  0   Live Births  0           Social History: Social History   Socioeconomic History   Marital status: Married    Spouse name: DShanon Brow   Number of children: Not on file   Years of education: Not on file   Highest education level: Not on file  Occupational History   Not on file  Tobacco Use   Smoking status: Never Smoker   Smokeless tobacco: Never Used  Vaping Use   Vaping Use: Never used  Substance and Sexual Activity   Alcohol use: No   Drug use: No   Sexual activity: Yes    Birth control/protection: None  Other Topics Concern   Not on file  Social History Narrative   Not on file   Social Determinants of Health   Financial Resource Strain: Low Risk    Difficulty of Paying Living Expenses:  Not very hard  Food Insecurity: No Food Insecurity   Worried About RCharity fundraiserin the Last Year: Never true   Ran Out of Food in the Last Year: Never true  Transportation Needs: No Transportation Needs   Lack of Transportation (Medical): No   Lack of Transportation (Non-Medical): No  Physical Activity: Insufficiently Active   Days of Exercise per Week: 2 days   Minutes of Exercise per Session: 30 min  Stress: No Stress Concern Present   Feeling of Stress : Not at all  Social Connections: Socially Integrated   Frequency of Communication with Friends and Family: More than three times a week   Frequency of Social Gatherings with Friends and Family: More than three times a week   Attends Religious Services: More than 4 times per year   Active Member of [employer] Organizations: Yes   Attends CM[employer] therapist More than 4 times per year   Marital Status: Married    Family History: Family History  Problem Relation Age of Onset   Diabetes Father    Hypertension Father    Heart failure Father    Diabetes Mother  Hypertension Mother    Kidney failure Mother    Cancer Paternal Grandfather        lung   Hypertension Paternal Grandmother    Heart attack Paternal Grandmother    Cancer Maternal Grandmother        kidney   Colon cancer Maternal Grandmother     Allergies: Allergies  Allergen Reactions   Red Dye Other (See Comments)    GI upset    Medications Prior to Admission  Medication Sig Dispense Refill Last Dose   Accu-Chek Softclix Lancets lancets Use as instructed to check blood sugar 4 times daily 100 each 12    Acetaminophen (TYLENOL PO) Take by mouth as needed.       aspirin 81 MG chewable tablet Chew 162 mg by mouth daily.      Blood Glucose Monitoring Suppl (ACCU-CHEK GUIDE ME) w/Device KIT 1 each by Does not apply route 4 (four) times daily. 1 kit 0    cholecalciferol (VITAMIN D) 1000 units tablet Take 10,000 Units by mouth daily.       glucose  blood (ACCU-CHEK GUIDE) test strip Use as instructed to check blood sugar 4 times daily 50 each 12    labetalol (NORMODYNE) 200 MG tablet Take 1 tablet (200 mg total) by mouth 2 (two) times daily. 60 tablet 3    metFORMIN (GLUCOPHAGE) 1000 MG tablet Take 1 tablet (1,000 mg total) by mouth daily with supper. 30 tablet 0    omeprazole (PRILOSEC) 20 MG capsule TAKE 1 CAPSULE BY MOUTH DAILY 30 capsule 10    ondansetron (ZOFRAN ODT) 4 MG disintegrating tablet Take 1 tablet (4 mg total) by mouth every 6 (six) hours as needed for nausea. 30 tablet 2    Prenatal Vit-Fe Fumarate-FA (PRENATAL MULTIVITAMIN) TABS tablet Take 1 tablet by mouth daily at 12 noon.      thyroid (NP THYROID) 120 MG tablet Take 1 tablet (120 mg total) by mouth daily before breakfast. 30 tablet 6      Review of Systems   All systems reviewed and negative except as stated in HPI  Blood pressure (!) 143/79, pulse 90, temperature 98.9 F (37.2 C), temperature source Oral, resp. rate 20, height 5' 6"  (1.676 m), weight (!) 148.6 kg, last menstrual period 01/18/2019. General appearance: alert, cooperative, no distress and morbidly obese Presentation: cephalic per RN SVE Fetal monitoring: baseline 150, mod variability, accels, no decels Uterine activity: irreg on toco   Prenatal labs: ABO, Rh: --/--/PENDING (10/01 3976) Antibody: PENDING (10/01 0039) Rubella: 3.32 (03/25 1104) RPR: Non Reactive (07/07 0845)  HBsAg: Negative (03/25 1104)  HIV: Non Reactive (07/07 0845)  GBS: Positive/-- (09/16 1405)  2 hr GTT: 103/180/161 Genetic screening: NT/IT neg, MaterniT21 normal female Anatomy US: normal  Prenatal Transfer Tool  Maternal Diabetes: Yes:  Diabetes Type:  Insulin/Medication controlled Genetic Screening: Normal Maternal Ultrasounds/Referrals: Normal Fetal Ultrasounds or other Referrals:  None Maternal Substance Abuse:  No Significant Maternal Medications:  Meds include: Other: metformin, labetalol, thyroid Significant  Maternal Lab Results: Group B Strep positive  Results for orders placed or performed during the hospital encounter of 10/18/19 (from the past 24 hour(s))  CBC   Collection Time: 10/18/19 12:39 AM  Result Value Ref Range   WBC 10.9 (H) 4.0 - 10.5 K/uL   RBC 3.93 3.87 - 5.11 MIL/uL   Hemoglobin 10.7 (L) 12.0 - 15.0 g/dL   HCT 33.5 (L) 36 - 46 %   MCV 85.2 80.0 - 100.0 fL  MCH 27.2 26.0 - 34.0 pg   MCHC 31.9 30.0 - 36.0 g/dL   RDW 13.9 11.5 - 15.5 %   Platelets 290 150 - 400 K/uL   nRBC 0.0 0.0 - 0.2 %  Type and screen   Collection Time: 10/18/19 12:39 AM  Result Value Ref Range   ABO/RH(D) PENDING    Antibody Screen PENDING    Sample Expiration      10/21/2019,2359 Performed at [facility], [place] [address], [place], [place] [address]   Results for orders placed or performed in visit on 10/17/19 (from the past 24 hour(s))  POC Urinalysis Dipstick OB   Collection Time: 10/17/19 10:15 AM  Result Value Ref Range   Color, UA     Clarity, UA     Glucose, UA Negative Negative   Bilirubin, UA     Ketones, UA n    Spec Grav, UA     Blood, UA n    pH, UA     POC,PROTEIN,UA Negative Negative, Trace, Small (1+), Moderate (2+), Large (3+), 4+   Urobilinogen, UA     Nitrite, UA n    Leukocytes, UA Negative Negative   Appearance     Odor      Patient Active Problem List   Diagnosis Date Noted   White classification A2 gestational diabetes mellitus 10/18/2019   Gestational diabetes mellitus, class A2 07/26/2019   Chronic hypertension affecting pregnancy 07/24/2019   Supervision of high risk pregnancy, antepartum 04/11/2019   Neck pain 02/07/2019   Hypothyroid 02/07/2019   Vitamin D deficiency 02/07/2019   Obesity 02/07/2019    Assessment: Cheyenne Moore is a 29 y.o. G2P0010 at 58w0dhere for IOL  #Labor: Initial exam 0/thick/ballotable. Starting on cytotec. Plan to place FB. #chronic HTN: Bps elevated, but not in severe range, continue 200 mg labetalol BID #A2GDM:  initial glc 99, q4 BGLs, continue metformin post partum #hypothyroid: on 120 mg NP thyroid (pre-pregnancy dose 90 mg), last TSH 1.390 on 8/9 #Pain: per request #FWB: Cat 1 #ID: GBS pos (PCN) #MOF: breast #MOC: POPs #Circ:  N/A   ATalitha GivensMD, PGY-1 OBGYN Faculty Teaching Service  10/18/2019, 1:32 AM   I personally saw and evaluated the patient, performing the key elements of the service. I developed and verified the management plan that is described in the resident's/student's note, and I agree with the content with my edits above. VSS, HRR&R, Resp unlabored, Legs neg.  FNigel Berthold CNM 10/18/2019 6:42 AM

## 2019-10-18 NOTE — Progress Notes (Signed)
Patient ID: Cheyenne Moore, female   DOB: Sep 25, 1990, 29 y.o.   MRN: [id number]  Cervical foley just came out; s/p cytotec x 3 doses  BP 116/62, P 79 FHR 120-130s. +accels, no decels Ctx irreg, mild Cx 4-5/70/vtx -2; post, soft  IUP@39 .0wks GDMA2 cHTN Cx favorable  CBG 96  Begin Pitocin 2x2 and titrate up to achieve active labor Anticipate vag del  Cheyenne Moore CNM 10/18/2019 2:07 PM

## 2019-10-18 NOTE — Progress Notes (Signed)
Patient ID: AHMARI GARTON, female   DOB: 12/11/1990, 29 y.o.   MRN: [id number]  Feels well; s/p cytotec x 2 doses (one vag, one buccal); starting 3rd dose of PCN; on home doses of Metformin and Labetalol  BP 116/62 FHR 120-130, +accels, no decels Irreg ctx, mild Cx 1/50/vtx -2  CBGs 103, 99  IUP@39 .0wks GDMA2 cHTN Cx unfavorable GBS pos  Cervical foley inserted without difficulty; 3rd dose of cytotec given PO Continue cytotec while foley is in place, and plan on Pit and/or AROM prn when foley comes out Anticipate vag del  Arabella Merles CNM 10/18/2019

## 2019-10-18 NOTE — Progress Notes (Signed)
Patient ID: Cheyenne Moore, female   DOB: 03-26-1990, 29 y.o.   MRN: [id number]  Sitting up in bed; feeling a little more crampy; Pit started at 1400  FHR 130s, +accels, no decels, Cat 1 Ctx irreg with Pit at 14mu/min Cx deferred (was 4/70/-2 after foley came out)  IUP@39 .0wks GDMA2 cHTN IOL process  CBGs 82, 84  Plan to check cx when becomes more uncomfortable  Arabella Merles CNM 10/18/2019

## 2019-10-19 DIAGNOSIS — O328XX0 Maternal care for other malpresentation of fetus, not applicable or unspecified: Secondary | ICD-10-CM

## 2019-10-19 LAB — GLUCOSE, CAPILLARY
Glucose-Capillary: 101 mg/dL — ABNORMAL HIGH (ref 70–99)
Glucose-Capillary: 114 mg/dL — ABNORMAL HIGH (ref 70–99)
Glucose-Capillary: 114 mg/dL — ABNORMAL HIGH (ref 70–99)
Glucose-Capillary: 84 mg/dL (ref 70–99)
Glucose-Capillary: 90 mg/dL (ref 70–99)
Glucose-Capillary: 99 mg/dL (ref 70–99)

## 2019-10-19 MED ORDER — OXYTOCIN-SODIUM CHLORIDE 30-0.9 UT/500ML-% IV SOLN: 1.0000 m[IU]/min | INTRAVENOUS | Status: DC

## 2019-10-19 MED ORDER — PENICILLIN G POT IN DEXTROSE 60000 UNIT/ML IV SOLN
3.0000 10*6.[IU] | INTRAVENOUS | Status: DC
  Administered 2019-10-19 – 2019-10-20 (×10): 3 10*6.[IU] via INTRAVENOUS
  Filled 2019-10-19 (×10): qty 50

## 2019-10-19 MED ORDER — MISOPROSTOL 50MCG HALF TABLET
50.0000 ug | ORAL_TABLET | ORAL | Status: DC
  Administered 2019-10-19 (×2): 50 ug via ORAL
  Filled 2019-10-19 (×3): qty 1

## 2019-10-19 MED ORDER — OXYTOCIN-SODIUM CHLORIDE 30-0.9 UT/500ML-% IV SOLN
1.0000 m[IU]/min | INTRAVENOUS | Status: DC
  Filled 2019-10-19: qty 500

## 2019-10-19 MED ORDER — MISOPROSTOL 25 MCG QUARTER TABLET
ORAL_TABLET | ORAL | Status: AC
  Filled 2019-10-19: qty 1

## 2019-10-19 MED ORDER — MISOPROSTOL 25 MCG QUARTER TABLET
25.0000 ug | ORAL_TABLET | Freq: Once | ORAL | Status: AC
  Administered 2019-10-19: 25 ug via VAGINAL

## 2019-10-19 NOTE — Progress Notes (Addendum)
Labor Progress Note  Subjective:  Patient reports feeling contractions, but not uncomfortable. About the same since around 2000.   Objective:  BP 126/82   Pulse 89   Temp 98.3 F (36.8 C) (Oral)   Resp 18   Ht 5\' 6"  (1.676 m)   Wt (!) 148.6 kg   LMP 01/18/2019   BMI 52.89 kg/m  Gen: Lying in bed comfortably. NAD.  Extremities: No signs of DVT.   CE: Dilation: 4 Effacement (%): 70 Station: -2 Presentation: Vertex Exam by:: shaw , CNM Contractions: irregular FH: BL 145, + var, + a, none decels. Cat 1 tracing   Assessment and Plan:  Cheyenne Moore is a 29 y.o. G2P0010 at [redacted]w[redacted]d - IOL A2GDM  Labor:  Continue metformin. Pit started at 1400. 20 mu pitocin ~0030. Will recheck cervix around 0200. If no changes, will pit break from ~0200-0600.  [redacted]w[redacted]d Pain control: no pain meds at this time. possible IV meds and maybe epidural in future . Anticipated MOD: Vag . PPH Risk: maternal obesity  Fetal Wellbeing: Cat 1 tracing . GBS Positive/-- (09/16 1405), PCN  . Continuous fetal monitoring  11-02-1993, M.D.  FM PGY 3 10/19/2019 12:38 AM

## 2019-10-19 NOTE — Progress Notes (Signed)
Labor Progress Note  Subjective:  Able to get some sleep. Feeling contractions, but still comfortable.   Objective:  BP 116/70   Pulse 85   Temp 98.9 F (37.2 C) (Oral)   Resp 18   Ht 5\' 6"  (1.676 m)   Wt (!) 148.6 kg   LMP 01/18/2019   BMI 52.89 kg/m  Gen: Lying in bed comfortably. NAD.  Extremities: No signs of DVT.   CE: Dilation: 4 Effacement (%): 70 Station: -2 Presentation: Vertex Exam by:: [email or web address], MD Contractions: regular q2-55minutes FH: BL 125, + var, + a, No decels. Cat 1 tracing     10/18/2019 14:07 10/18/2019 17:50 10/18/2019 22:28 10/19/2019 04:03  CBG  82 84 75 90   Assessment and Plan:  Cheyenne Moore is a 29 y.o. G2P0010 at [redacted]w[redacted]d - IOL A2GDM  Labor:  S/p FB. Pitocin break x 4 hours. Check in on patient at 0700.  . Pain control: no pain meds at this time. possible IV meds and maybe epidural in future . Anticipated MOD: Vag . PPH Risk: maternal obesity  Fetal Wellbeing: Cat 1 tracing . GBS Positive/-- (09/16 1405), PCN  . Continuous fetal monitoring  GDM q 4 hour CBG  Continue metformin   11-02-1993, M.D.  FM PGY 3 10/19/2019 4:57 AM

## 2019-10-19 NOTE — Progress Notes (Addendum)
Labor Progress Note  Subjective:  Comfortable. No complaints.   Objective:  BP 121/71   Pulse 80   Temp 98.9 F (37.2 C) (Oral)   Resp 18   Ht 5\' 6"  (1.676 m)   Wt (!) 148.6 kg   LMP 01/18/2019   BMI 52.89 kg/m  Gen: Lying in bed comfortably. NAD.  Extremities: No signs of DVT.  Trace BLEE  CE: Dilation: 4 Effacement (%): 70 Station: -2 Presentation: Vertex Exam by:: [email or web address], MD Contractions: regular q2-77minutes FH: BL 125, moderate var, + a, No decels. Cat 1 tracing   Recent Labs  Lab 10/18/19 1009 10/18/19 1407 10/18/19 1750 10/18/19 2228 10/19/19 0403  GLUCAP 96 82 84 75 90    Assessment and Plan:  Cheyenne Moore is a 29 y.o. G2P0010 at [redacted]w[redacted]d - IOL A2GDM  Labor:  S/p FB. S/p pit break 3-7a. Restart high dose pitocin, 4x4.  04-01-1976 Pain control: no pain meds at this time. possible IV meds and maybe epidural in future . Anticipated MOD: Vag . PPH Risk: maternal obesity  Fetal Wellbeing: Cat 1 tracing . GBS Positive/-- (09/16 1405), PCN  . Continuous fetal monitoring  GDM  Q4 hour CBG   Continue metformin   Hypothyroidism  Continue home thyroid table   CHTN BP: (96-130)/(55-82) 121/71 (10/02 0700)  Continue home ASA 162, labetalol   Continue to monitor Bps  mIVF KVO   03-13-1993, M.D.  FM PGY 3 10/19/2019 7:24 AM

## 2019-10-19 NOTE — Progress Notes (Signed)
Labor Progress Note Cheyenne Moore is a 29 y.o. G2P0010 at [redacted]w[redacted]d presented for IOL A1GDM  S:  Patient comfortable. Feeling better after shower and meal  O:  BP 130/80   Pulse 87   Temp 98.4 F (36.9 C)   Resp 18   Ht 5\' 6"  (1.676 m)   Wt (!) 148.6 kg   LMP 01/18/2019   BMI 52.89 kg/m   Fetal Tracing:  Baseline: 120 Variability: moderate Accels: 15x15 Decels: none  Toco: 5-7   CVE: Dilation: 4 Effacement (%): 70 Cervical Position: Middle Station: -3 Presentation: Vertex Exam by:: cNeil,cnm   A&P: 29 y.o. G2P0010 [redacted]w[redacted]d IOL A1GDM #Labor: Significant change from last exam. Will do another cytotec and consider pit at next exam if effacement continues #Pain: per patient reques #FWB: Cat 1 #GBS positive   [redacted]w[redacted]d, CNM 4:09 PM

## 2019-10-19 NOTE — Progress Notes (Signed)
Labor Progress Note TAYDEN DURAN is a 29 y.o. G2P0010 at [redacted]w[redacted]d presented for IOL A2GDM  S:  Patient comfortable, not feeling contractions  O:  BP (!) 141/75   Pulse 80   Temp 98.2 F (36.8 C) (Oral)   Resp 18   Ht 5\' 6"  (1.676 m)   Wt (!) 148.6 kg   LMP 01/18/2019   BMI 52.89 kg/m   Fetal Tracing:  Baseline: 130 Variability: moderate Accels: 15x15 Decels: none  Toco: none   CVE: Dilation: 4 Effacement (%): Thick Cervical Position: Posterior Station: Ballotable Presentation: Vertex Exam by:: CNeil,cnm   A&P: 29 y.o. G2P0010 [redacted]w[redacted]d IOL A2GDM #Labor: Cervix foley 4cm and thick. Will d/c pitocin and repeat cytotec. Patient verbalized understanding. Will have patient shower and eat during this time.  #Pain: per patient request #FWB: Cat 1 #GBS positive  [redacted]w[redacted]d, CNM 11:41 AM

## 2019-10-19 NOTE — Progress Notes (Signed)
Labor Progress Note Cheyenne Moore is a 29 y.o. G2P0010 at [redacted]w[redacted]d presented for IOL for A2GDM   S:  Patient reports starting to feel contractions now.   O:  BP (!) 144/91   Pulse 84   Temp 98.3 F (36.8 C) (Oral)   Resp 18   Ht 5\' 6"  (1.676 m)   Wt (!) 148.6 kg   LMP 01/18/2019   BMI 52.89 kg/m   Fetal Tracing:  Baseline: 15 Variability: moderate Accels: 15x15 Decels: none  Toco: 5-8   CVE: Dilation: 4 Effacement (%): 70 Station: -2 Presentation: Vertex Exam by:: [email or web address], MD   A&P: 29 y.o. G2P0010 [redacted]w[redacted]d IOL A2GDM #Labor: Pit restarted at 0700. Will increase 2x2 until adequate pattern, check and AROM then #Pain: per patient request #FWB: Cat 1 #GBS positive  [redacted]w[redacted]d, CNM 9:15 AM

## 2019-10-19 NOTE — Progress Notes (Signed)
EFM off to shower.

## 2019-10-19 NOTE — Progress Notes (Addendum)
Labor Progress Note  Subjective:  Doing well. No complaints at this time   Objective:  BP 115/69   Pulse 84   Temp 98.5 F (36.9 C) (Oral)   Resp 18   Ht 5\' 6"  (1.676 m)   Wt (!) 148.6 kg   LMP 01/18/2019   BMI 52.89 kg/m  Gen: Lying in bed comfortably. NAD.  Extremities: No signs of DVT. 1+ BLEE    CE: Dilation: 4 Effacement (%): 70 Cervical Position: Middle Station: -3 Presentation: Vertex Exam by:: Dr. [email or web address] Contractions: irritable FH: BL 140, moderate var, + a, none decels.   Assessment and Plan:  Cheyenne Moore is a 29 y.o. G2P0010 at [redacted]w[redacted]d - IOL A2GDM  Labor:  Induction started10/1 @ 0000.  S/p FB & cytotec. S/p pitocin 10/1 1400-10/2 0300. Break. Pitocin 4x4 10/2 0700-1200. Cytotec x 4 (last 2100).   Pain control: no pain meds at this time. possible IV meds and maybe epidural in future  Anticipated MOD: Vag  PPH Risk: maternal obesity  Fetal Wellbeing: Cat 1 tracing  GBS Positive/-- (09/16 1405), PCN   Continuous fetal monitoring  A2GDM  Q4 hour CBG   Continue metformin   Hypothyroidism  Continue home thyroid tablet  CHTN BP ranges last 24 hours: (86-148)/(50-95) 115/69 (10/02 2056)  Continue home labetalol   Continue to monitor Bps  mIVF KVO   2057, M.D.  FM PGY 3 10/19/2019 9:06 PM

## 2019-10-20 ENCOUNTER — Inpatient Hospital Stay (HOSPITAL_COMMUNITY): Payer: Medicaid Other | Admitting: Anesthesiology

## 2019-10-20 LAB — GLUCOSE, CAPILLARY
Glucose-Capillary: 101 mg/dL — ABNORMAL HIGH (ref 70–99)
Glucose-Capillary: 116 mg/dL — ABNORMAL HIGH (ref 70–99)
Glucose-Capillary: 80 mg/dL (ref 70–99)
Glucose-Capillary: 87 mg/dL (ref 70–99)
Glucose-Capillary: 95 mg/dL (ref 70–99)
Glucose-Capillary: 97 mg/dL (ref 70–99)

## 2019-10-20 LAB — CBC
HCT: 35.4 % — ABNORMAL LOW (ref 36.0–46.0)
Hemoglobin: 11.2 g/dL — ABNORMAL LOW (ref 12.0–15.0)
MCH: 26.9 pg (ref 26.0–34.0)
MCHC: 31.6 g/dL (ref 30.0–36.0)
MCV: 84.9 fL (ref 80.0–100.0)
Platelets: 269 10*3/uL (ref 150–400)
RBC: 4.17 MIL/uL (ref 3.87–5.11)
RDW: 14.3 % (ref 11.5–15.5)
WBC: 12 10*3/uL — ABNORMAL HIGH (ref 4.0–10.5)
nRBC: 0 % (ref 0.0–0.2)

## 2019-10-20 MED ORDER — FENTANYL CITRATE (PF) 100 MCG/2ML IJ SOLN
100.0000 ug | Freq: Once | INTRAMUSCULAR | Status: AC
  Administered 2019-10-20: 100 ug via EPIDURAL

## 2019-10-20 MED ORDER — OXYTOCIN-SODIUM CHLORIDE 30-0.9 UT/500ML-% IV SOLN
1.0000 m[IU]/min | INTRAVENOUS | Status: DC
  Administered 2019-10-20: 2 m[IU]/min via INTRAVENOUS
  Administered 2019-10-20: 24 m[IU]/min via INTRAVENOUS
  Filled 2019-10-20 (×2): qty 500

## 2019-10-20 MED ORDER — BUPIVACAINE HCL (PF) 0.25 % IJ SOLN
INTRAMUSCULAR | Status: DC | PRN
  Administered 2019-10-20: 10 mL via EPIDURAL

## 2019-10-20 MED ORDER — LIDOCAINE HCL (PF) 1 % IJ SOLN
INTRAMUSCULAR | Status: DC | PRN
  Administered 2019-10-20: 11 mL via EPIDURAL

## 2019-10-20 MED ORDER — TERBUTALINE SULFATE 1 MG/ML IJ SOLN: 0.2500 mg | Freq: Once | INTRAMUSCULAR | Status: DC | PRN

## 2019-10-20 MED ORDER — BUPIVACAINE HCL (PF) 0.75 % IJ SOLN
INTRAMUSCULAR | Status: DC | PRN
Start: 2019-10-20 — End: 2019-10-21
  Administered 2019-10-20: 12 mL/h via EPIDURAL

## 2019-10-20 NOTE — Progress Notes (Signed)
Cheyenne Moore is a 29 y.o. G2P0010 at [redacted]w[redacted]d.  Subjective: Mild cramping  Objective: BP 130/75   Pulse 76   Temp 98.5 F (36.9 C) (Oral)   Resp (!) 22   Ht 5\' 6"  (1.676 m)   Wt (!) 148.6 kg   LMP 01/18/2019   BMI 52.89 kg/m    FHT:  FHR: 125  bpm, variability: mod,  accelerations:  15x15,  decelerations:  none UC:   Q 2-5 minutes, mild Dilation: 4.5 Effacement (%): 60 Cervical Position: Posterior Station: -3 Presentation: Vertex Exam by:: VSmith,cnm AROM large amount of clear fluid.   Labs: Results for orders placed or performed during the hospital encounter of 10/18/19 (from the past 24 hour(s))  Glucose, capillary     Status: Abnormal   Collection Time: 10/19/19  4:10 PM  Result Value Ref Range   Glucose-Capillary 101 (H) 70 - 99 mg/dL  Glucose, capillary     Status: Abnormal   Collection Time: 10/19/19  8:24 PM  Result Value Ref Range   Glucose-Capillary 114 (H) 70 - 99 mg/dL  Glucose, capillary     Status: Abnormal   Collection Time: 10/19/19 11:53 PM  Result Value Ref Range   Glucose-Capillary 114 (H) 70 - 99 mg/dL  Glucose, capillary     Status: Abnormal   Collection Time: 10/20/19  4:18 AM  Result Value Ref Range   Glucose-Capillary 101 (H) 70 - 99 mg/dL  Glucose, capillary     Status: None   Collection Time: 10/20/19  7:52 AM  Result Value Ref Range   Glucose-Capillary 95 70 - 99 mg/dL    Assessment / Plan: [redacted]w[redacted]d week IUP Labor: Early, protracted IOL/latent Fetal Wellbeing:  Category I Pain Control:  Comfort measures  Anticipated MOD: [redacted]w[redacted]d, Vicenta Aly, CNM 10/20/2019 10:17 AM

## 2019-10-20 NOTE — Progress Notes (Signed)
Cheyenne Moore is a 29 y.o. G2P0010 at [redacted]w[redacted]d.  Subjective: Uncomfortable w/ contractions. IV pain meds helped but now considering epidural. More uncomfortable then she got w/ pitocin previously.   Objective: BP 130/75   Pulse 76   Temp 98.5 F (36.9 C) (Oral)   Resp (!) 22   Ht 5\' 6"  (1.676 m)   Wt (!) 148.6 kg   LMP 01/18/2019   BMI 52.89 kg/m    FHT:  FHR: 125 bpm, variability: mod,  accelerations:  15x15,  decelerations:  none UC:   Q 1-4 minutes, moderate. MVU's 150-170 Dilation: 5 Effacement (%): 80 Cervical Position: Middle Station: -2 Presentation: Vertex Exam by:: VSmith,cnm  Labs: Results for orders placed or performed during the hospital encounter of 10/18/19 (from the past 24 hour(s))  Glucose, capillary     Status: Abnormal   Collection Time: 10/19/19  4:10 PM  Result Value Ref Range   Glucose-Capillary 101 (H) 70 - 99 mg/dL  Glucose, capillary     Status: Abnormal   Collection Time: 10/19/19  8:24 PM  Result Value Ref Range   Glucose-Capillary 114 (H) 70 - 99 mg/dL  Glucose, capillary     Status: Abnormal   Collection Time: 10/19/19 11:53 PM  Result Value Ref Range   Glucose-Capillary 114 (H) 70 - 99 mg/dL  Glucose, capillary     Status: Abnormal   Collection Time: 10/20/19  4:18 AM  Result Value Ref Range   Glucose-Capillary 101 (H) 70 - 99 mg/dL  Glucose, capillary     Status: None   Collection Time: 10/20/19  7:52 AM  Result Value Ref Range   Glucose-Capillary 95 70 - 99 mg/dL  Glucose, capillary     Status: None   Collection Time: 10/20/19 12:12 PM  Result Value Ref Range   Glucose-Capillary 97 70 - 99 mg/dL  CBC     Status: Abnormal   Collection Time: 10/20/19  2:06 PM  Result Value Ref Range   WBC 12.0 (H) 4.0 - 10.5 K/uL   RBC 4.17 3.87 - 5.11 MIL/uL   Hemoglobin 11.2 (L) 12.0 - 15.0 g/dL   HCT 12/20/19 (L) 36 - 46 %   MCV 84.9 80.0 - 100.0 fL   MCH 26.9 26.0 - 34.0 pg   MCHC 31.6 30.0 - 36.0 g/dL   RDW 08.1 44.8 - 18.5 %   Platelets  269 150 - 400 K/uL   nRBC 0.0 0.0 - 0.2 %    Assessment / Plan: [redacted]w[redacted]d week IUP Labor: Early, has made progress since AROM. CTO labor progress closely. Baby Cheyenne Moore's to ~8 lb although exam limited by body habitus. Extrapolates to low 8 lb by 36 wk [redacted]w[redacted]d.  A2GDM: CBGs nml range.  Fetal Wellbeing:  Category I Pain Control:  IV pain meds. Considering epidural.  Anticipated MOD:  Uncertain. Will continue titrating pitocin to achieve adequate contractions.   [place], Katrinka Blazing, [place] 10/20/2019 2:38 PM

## 2019-10-20 NOTE — Progress Notes (Signed)
Labor Progress Note  Subjective:  Felt a few contractions in the last 4 hours.   Objective:  BP 115/61   Pulse 89   Temp 98.1 F (36.7 C) (Oral)   Resp 18   Ht 5\' 6"  (1.676 m)   Wt (!) 148.6 kg   LMP 01/18/2019   BMI 52.89 kg/m  Gen: Lying in bed comfortably. NAD.  Extremities: No signs of DVT. 1+ BLEE    CE: Dilation: 4 Effacement (%): 70 Cervical Position: Middle Station: -3 Presentation: Vertex Exam by:: Dr. [email or web address] Contractions: irritable FH: BL 130, moderate var, + a, -decels.   Assessment and Plan:  Cheyenne Moore is a 29 y.o. G2P0010 at [redacted]w[redacted]d - IOL A2GDM.   Labor:  Induction started10/1 @ 0000. S/p FB & cytotec. S/p pitocin 10/1 1400-10/2 0300. Break. Pitocin 4x4 10/2 0700-1200. Cytotec x 4 (last 2100).  Will restart pitocin 2x2 and reassess in 4 hours.   Pain control: no pain meds at this time. possible IV meds and maybe epidural in future  Anticipated MOD: Vag  PPH Risk: maternal obesity  Fetal Wellbeing: Cat 1 tracing  GBS Positive/-- (09/16 1405), PCN   Continuous fetal monitoring  A2GDM  Q4 hour CBG   Continue metformin   Hypothyroidism  Continue home thyroid tablet  CHTN BP: (86-148)/(50-95) 115/61 (10/02 2354)  Continue home labetalol   Continue to monitor Bps  mIVF KVO   03-13-1993, M.D.  FM PGY 3 10/20/2019 1:09 AM

## 2019-10-20 NOTE — Progress Notes (Signed)
LABOR PROGRESS NOTE  Cheyenne Moore is a 29 y.o. G2P0010 at [redacted]w[redacted]d.  Subjective: Uncomfortable, feeling strong contractions.  Objective: BP 114/69   Pulse 97   Temp 98.5 F (36.9 C) (Oral)   Resp 17   Ht 5\' 6"  (1.676 m)   Wt (!) 148.6 kg   LMP 01/18/2019   SpO2 97%   BMI 52.89 kg/m   Dilation: 6 Effacement (%): 90 Cervical Position: Posterior Station: -2 Presentation: Vertex Exam by:: Dr. [email or web address] Fetal monitoring: Baseline: 150 bpm, Variability: Good {> 6 bpm), Accelerations: Reactive, and Decelerations: Absent Uterine activity: q2-4 mins  Labs: Lab Results  Component Value Date   WBC 12.0 (H) 10/20/2019   HGB 11.2 (L) 10/20/2019   HCT 35.4 (L) 10/20/2019   MCV 84.9 10/20/2019   PLT 269 10/20/2019    Patient Active Problem List   Diagnosis Date Noted  . Positive GBS test 10/18/2019  . Gestational diabetes mellitus, class A2 07/26/2019  . Chronic hypertension affecting pregnancy 07/24/2019  . Supervision of high risk pregnancy, antepartum 04/11/2019  . Neck pain 02/07/2019  . Hypothyroid 02/07/2019  . Vitamin D deficiency 02/07/2019  . Obesity 02/07/2019    Assessment / Plan: IOL 2/2 A2GDM  Labor: Not making significant progress over 5 hours. Pit @ 34 ml/hr.  A2GDM: CBGs nml range.  Fetal Wellbeing:  Category I Pain Control:  IV pain meds. Considering epidural.  Anticipated MOD:  Uncertain. Will continue for VD unless fetal distress.  02/09/2019 MD, PGY-1 Family Medicine Resident, [facility] Faculty Teaching Service  10/20/2019, 11:22 PM

## 2019-10-20 NOTE — Anesthesia Procedure Notes (Signed)
Epidural Patient location during procedure: OB Start time: 10/20/2019 3:02 PM End time: 10/20/2019 3:17 PM  Staffing Anesthesiologist: Lowella Curb, MD Performed: anesthesiologist   Preanesthetic Checklist Completed: patient identified, IV checked, site marked, risks and benefits discussed, surgical consent, monitors and equipment checked, pre-op evaluation and timeout performed  Epidural Patient position: sitting Prep: ChloraPrep Patient monitoring: heart rate, cardiac monitor, continuous pulse ox and blood pressure Approach: midline Location: L2-L3 Injection technique: LOR saline  Needle:  Needle type: Tuohy  Needle gauge: 17 G Needle length: 9 cm Needle insertion depth: 8 cm Catheter type: closed end flexible Catheter size: 20 Guage Catheter at skin depth: 13 cm Test dose: negative  Assessment Events: blood not aspirated, injection not painful, no injection resistance, no paresthesia and negative IV test  Additional Notes Reason for block:procedure for pain

## 2019-10-20 NOTE — Anesthesia Preprocedure Evaluation (Signed)
Anesthesia Evaluation  Patient identified by MRN, date of birth, ID band Patient awake    Reviewed: Allergy & Precautions, NPO status , Patient's Chart, lab work & pertinent test results  Airway Mallampati: II  TM Distance: >3 FB Neck ROM: Full    Dental no notable dental hx.    Pulmonary neg pulmonary ROS,    Pulmonary exam normal breath sounds clear to auscultation       Cardiovascular hypertension, negative cardio ROS Normal cardiovascular exam Rhythm:Regular Rate:Normal     Neuro/Psych negative neurological ROS  negative psych ROS   GI/Hepatic Neg liver ROS, GERD  ,  Endo/Other  diabetesHypothyroidism Morbid obesity  Renal/GU negative Renal ROS  negative genitourinary   Musculoskeletal negative musculoskeletal ROS (+)   Abdominal (+) + obese,   Peds negative pediatric ROS (+)  Hematology negative hematology ROS (+)   Anesthesia Other Findings   Reproductive/Obstetrics (+) Pregnancy                             Anesthesia Physical Anesthesia Plan  ASA: III  Anesthesia Plan: Epidural   Post-op Pain Management:    Induction:   PONV Risk Score and Plan:   Airway Management Planned:   Additional Equipment:   Intra-op Plan:   Post-operative Plan:   Informed Consent:   Plan Discussed with:   Anesthesia Plan Comments:         Anesthesia Quick Evaluation

## 2019-10-20 NOTE — Progress Notes (Signed)
Labor Progress Note  Subjective:  Not feeling contractions.   Objective:  BP 119/77   Pulse 89   Temp 98.3 F (36.8 C) (Oral)   Resp 18   Ht 5\' 6"  (1.676 m)   Wt (!) 148.6 kg   LMP 01/18/2019   BMI 52.89 kg/m  Gen: Lying in bed comfortably. NAD.  Extremities: No signs of DVT. 1+ BLEE    CE: Dilation: 4 Effacement (%): 60 Cervical Position: Middle Station: Ballotable Presentation: Vertex Exam by:: Dr. [email or web address] Contractions: irritable FH: BL 130, moderate var, + a, -decels.   Assessment and Plan:  Cheyenne Moore is a 29 y.o. G2P0010 at 108w1d - IOL A2GDM.   Labor:  Induction started10/1 @ 0000. S/p FB & cytotec. S/p pitocin 10/1 1400-10/2 0300. Break. Pitocin 4x4 10/2 0700-1200. Cytotec x 4 (last 2100). Pitocin started 0130, now at 12. Unable to safely AROM at this point as still ballotable and posterior, and has not made cervical change.  -have also discussed with patient possibility of failed IOL at this time given >48 hours and failure to progress from 4 cm. Discussed care plan with Dr. [id number], patient, and partner and answered all questions. Will continue to uptitrate pitocin as able and recheck in 3-4 hours, consider AROM at that time.   Pain control: no pain meds at this time. possible IV meds and maybe epidural in future  Anticipated MOD: Vag  PPH Risk: maternal obesity, induction   Fetal Wellbeing: Cat 1 tracing  GBS Positive/-- (09/16 1405), PCN   Continuous fetal monitoring  A2GDM  Q4 hour CBG   Continue metformin   Hypothyroidism  Continue home thyroid tablet  CHTN BP: (86-148)/(50-95) 115/61 (10/02 2354)  Continue home labetalol   Continue to monitor Bps   03-13-1993, MD [facility] Family Medicine Fellow, [facility] for [facility], [facility]

## 2019-10-21 ENCOUNTER — Encounter (HOSPITAL_COMMUNITY): Payer: Self-pay | Admitting: Family Medicine

## 2019-10-21 DIAGNOSIS — I10 Essential (primary) hypertension: Secondary | ICD-10-CM

## 2019-10-21 DIAGNOSIS — O24420 Gestational diabetes mellitus in childbirth, diet controlled: Secondary | ICD-10-CM

## 2019-10-21 DIAGNOSIS — O24419 Gestational diabetes mellitus in pregnancy, unspecified control: Secondary | ICD-10-CM

## 2019-10-21 DIAGNOSIS — O99824 Streptococcus B carrier state complicating childbirth: Secondary | ICD-10-CM

## 2019-10-21 LAB — GLUCOSE, RANDOM: Glucose, Bld: 118 mg/dL — ABNORMAL HIGH (ref 70–99)

## 2019-10-21 MED ORDER — TRANEXAMIC ACID-NACL 1000-0.7 MG/100ML-% IV SOLN
INTRAVENOUS | Status: AC
  Filled 2019-10-21: qty 100

## 2019-10-21 MED ORDER — COCONUT OIL OIL
1.0000 "application " | TOPICAL_OIL | Status: DC | PRN
  Administered 2019-10-22: 1 via TOPICAL

## 2019-10-21 MED ORDER — ONDANSETRON HCL 4 MG PO TABS: 4.0000 mg | ORAL_TABLET | ORAL | Status: DC | PRN

## 2019-10-21 MED ORDER — THYROID 60 MG PO TABS
90.0000 mg | ORAL_TABLET | Freq: Every day | ORAL | Status: DC
  Administered 2019-10-21 – 2019-10-23 (×3): 90 mg via ORAL
  Filled 2019-10-21 (×4): qty 1

## 2019-10-21 MED ORDER — TRANEXAMIC ACID-NACL 1000-0.7 MG/100ML-% IV SOLN
1000.0000 mg | INTRAVENOUS | Status: AC
  Administered 2019-10-21: 1000 mg via INTRAVENOUS

## 2019-10-21 MED ORDER — SODIUM CHLORIDE 0.9 % IV SOLN
2.0000 g | Freq: Four times a day (QID) | INTRAVENOUS | Status: AC
  Administered 2019-10-21 – 2019-10-22 (×4): 2 g via INTRAVENOUS
  Filled 2019-10-21 (×5): qty 2000

## 2019-10-21 MED ORDER — BENZOCAINE-MENTHOL 20-0.5 % EX AERO
1.0000 "application " | INHALATION_SPRAY | CUTANEOUS | Status: DC | PRN
  Administered 2019-10-21: 1 via TOPICAL
  Filled 2019-10-21: qty 56

## 2019-10-21 MED ORDER — WITCH HAZEL-GLYCERIN EX PADS: 1.0000 "application " | MEDICATED_PAD | CUTANEOUS | Status: DC | PRN

## 2019-10-21 MED ORDER — GENTAMICIN SULFATE 40 MG/ML IJ SOLN
5.0000 mg/kg | Freq: Once | INTRAVENOUS | Status: AC
  Administered 2019-10-21: 740 mg via INTRAVENOUS
  Filled 2019-10-21: qty 18.5

## 2019-10-21 MED ORDER — TETANUS-DIPHTH-ACELL PERTUSSIS 5-2.5-18.5 LF-MCG/0.5 IM SUSP: 0.5000 mL | Freq: Once | INTRAMUSCULAR | Status: DC

## 2019-10-21 MED ORDER — ACETAMINOPHEN 325 MG PO TABS
650.0000 mg | ORAL_TABLET | Freq: Four times a day (QID) | ORAL | Status: DC | PRN
  Administered 2019-10-21: 650 mg via ORAL
  Filled 2019-10-21 (×2): qty 2

## 2019-10-21 MED ORDER — OXYTOCIN-SODIUM CHLORIDE 30-0.9 UT/500ML-% IV SOLN
2.5000 [IU]/h | INTRAVENOUS | Status: DC
  Administered 2019-10-21: 2.5 [IU]/h via INTRAVENOUS

## 2019-10-21 MED ORDER — ONDANSETRON HCL 4 MG/2ML IJ SOLN: 4.0000 mg | INTRAMUSCULAR | Status: DC | PRN

## 2019-10-21 MED ORDER — IBUPROFEN 600 MG PO TABS
600.0000 mg | ORAL_TABLET | Freq: Four times a day (QID) | ORAL | Status: DC
  Administered 2019-10-21 – 2019-10-23 (×10): 600 mg via ORAL
  Filled 2019-10-21 (×10): qty 1

## 2019-10-21 MED ORDER — INFLUENZA VAC SPLIT QUAD 0.5 ML IM SUSY
0.5000 mL | PREFILLED_SYRINGE | INTRAMUSCULAR | Status: AC
  Administered 2019-10-21: 0.5 mL via INTRAMUSCULAR
  Filled 2019-10-21: qty 0.5

## 2019-10-21 MED ORDER — SIMETHICONE 80 MG PO CHEW: 80.0000 mg | CHEWABLE_TABLET | ORAL | Status: DC | PRN

## 2019-10-21 MED ORDER — DIBUCAINE (PERIANAL) 1 % EX OINT: 1.0000 "application " | TOPICAL_OINTMENT | CUTANEOUS | Status: DC | PRN

## 2019-10-21 NOTE — Lactation Note (Signed)
This note was copied from a baby's chart. Lactation Consultation Note  Patient Name: Cheyenne Moore [id number] Date: 10/21/2019 Reason for consult: Initial assessment;Term;Primapara;1st time breastfeeding  P1 mother whose infant is now 61 hours old.  This is a term baby at 39+3 weeks.  Baby was swaddled and asleep on father's chest when I arrived.  Mother was happy to report that her baby has latched and fed a couple of times since delivery.  Reviewed basic breast feeding concepts with mother.  Taught hand expression and mother was able to easily express colostrum drops.  Container provided and milk storage times discussed.  Finger feeding demonstrated.  Suggested mother rub her colostrum drops into her nipple/areola after every feeding for comfort.  Mother will feed 8-12 times/24 hours or sooner if baby shows feeding cues.  Reviewed cues.  Suggested she call for latch assistance as needed.    Mother does not have a DEBP for home use.  She is a [facility] participant in [place].  Referral faxed.  Mother will follow up with a phone call tomorrow morning.  Mom made aware of O/P services, breastfeeding support groups, community resources, and our phone # for post-discharge questions.    Maternal Data Formula Feeding for Exclusion: No Has patient been taught Hand Expression?: Yes Does the patient have breastfeeding experience prior to this delivery?: No  Feeding Feeding Type: Breast Fed  LATCH Score Latch: Grasps breast easily, tongue down, lips flanged, rhythmical sucking.  Audible Swallowing: A few with stimulation  Type of Nipple: Everted at rest and after stimulation  Comfort (Breast/Nipple): Soft / non-tender  Hold (Positioning): Assistance needed to correctly position infant at breast and maintain latch.  LATCH Score: 8  Interventions    Lactation Tools Discussed/Used WIC Program: Yes   Consult Status Consult Status: Follow-up Date: 10/22/19 Follow-up type:  In-patient    Cheyenne Moore 10/21/2019, 11:55 AM

## 2019-10-21 NOTE — Anesthesia Postprocedure Evaluation (Signed)
Anesthesia Post Note  Patient: VERLIA KANEY  Procedure(s) Performed: AN AD HOC LABOR EPIDURAL     Patient location during evaluation: Mother Baby Anesthesia Type: Epidural Level of consciousness: awake and alert Pain management: pain level controlled Vital Signs Assessment: post-procedure vital signs reviewed and stable Respiratory status: spontaneous breathing, nonlabored ventilation and respiratory function stable Cardiovascular status: stable Postop Assessment: no headache, no backache, epidural receding, no apparent nausea or vomiting, adequate PO intake and able to ambulate Anesthetic complications: no   No complications documented.  Last Vitals:  Vitals:   10/21/19 0730 10/21/19 1133  BP: 112/70 107/73  Pulse: 90 88  Resp: 18 17  Temp: 36.6 C 36.8 C  SpO2:      Last Pain:  Vitals:   10/21/19 1133  TempSrc: Oral  PainSc:    Pain Goal:                   Blythe Stanford

## 2019-10-21 NOTE — Discharge Summary (Addendum)
Postpartum Discharge Summary  Date of Service updated 10/23/19   Patient Name: Cheyenne Moore DOB: 24-Dec-1990 MRN: [id number]  Date of admission: 10/18/2019 Delivery date:10/21/2019  Delivering provider: Wende Mott  Date of discharge: 10/23/2019  Admitting diagnosis: White classification A2 gestational diabetes mellitus [O24.419] Intrauterine pregnancy: [redacted]w[redacted]d    Secondary diagnosis:  Active Problems:   Hypothyroid   Obesity   Chronic hypertension affecting pregnancy   Gestational diabetes mellitus, class A2   Positive GBS test  Additional problems:     Discharge diagnosis: Term Pregnancy Delivered, CHTN and GDM A2                                              Post partum procedures:none Augmentation: AROM, Pitocin, Cytotec and IP Foley Complications: None  Hospital course: Induction of Labor With Vaginal Delivery   29y.o. yo G2P0010 at 27w3das admitted to the hospital 10/18/2019 for induction of labor.  Indication for induction: A2 DM and CHTN.  Patient had an uncomplicated labor course as follows: Membrane Rupture Time/Date: 10:15 AM ,10/20/2019   Delivery Method:Vaginal, Spontaneous  Episiotomy: None  Lacerations:  1st degree;2nd degree;Perineal  Details of delivery can be found in separate delivery note.  Patient had a routine postpartum course. Patient is discharged home 10/23/19.  Newborn Data: Birth date:10/21/2019  Birth time:3:26 AM  Gender:Female  Living status:Living  Apgars:8 ,9  Weight:3544 g   Magnesium Sulfate received: No BMZ received: No Rhophylac:N/A MMR:N/A, immune T-DaP:Given prenatally Flu: Yes Transfusion:No  Physical exam  Vitals:   10/22/19 0714 10/22/19 1416 10/22/19 2054 10/23/19 0525  BP: 111/66 126/76 131/82 132/89  Pulse:  93 79 67  Resp:  _0 Temp:  98 F (36.7 C) 98.9 F (37.2 C) 98.3 F (36.8 C)  TempSrc:  Oral Oral Oral  SpO2:   100% 99%  Weight:      Height:       General: alert, cooperative and no  distress Uterine Fundus: firm  Labs: Lab Results  Component Value Date   WBC 12.0 (H) 10/20/2019   HGB 11.2 (L) 10/20/2019   HCT 35.4 (L) 10/20/2019   MCV 84.9 10/20/2019   PLT 269 10/20/2019   CMP Latest Ref Rng & Units 10/21/2019  Glucose 70 - 99 mg/dL 118(H)  BUN 6 - 20 mg/dL -  Creatinine 0.57 - 1.00 mg/dL -  Sodium 134 - 144 mmol/L -  Potassium 3.5 - 5.2 mmol/L -  Chloride 96 - 106 mmol/L -  CO2 20 - 29 mmol/L -  Calcium 8.7 - 10.2 mg/dL -  Total Protein 6.0 - 8.5 g/dL -  Total Bilirubin 0.0 - 1.2 mg/dL -  Alkaline Phos 48 - 121 IU/L -  AST 0 - 40 IU/L -  ALT 0 - 32 IU/L -   Edinburgh Score: Edinburgh Postnatal Depression Scale Screening Tool 10/21/2019  I have been able to laugh and see the funny side of things. 0  I have looked forward with enjoyment to things. 0  I have blamed myself unnecessarily when things went wrong. 0  I have been anxious or worried for no good reason. 2  I have felt scared or panicky for no good reason. 0  Things have been getting on top of me. 1  I have been so unhappy that I have had difficulty sleeping.  0  I have felt sad or miserable. 0  I have been so unhappy that I have been crying. 0  The thought of harming myself has occurred to me. 0  Edinburgh Postnatal Depression Scale Total 3     After visit meds:  Allergies as of 10/23/2019      Reactions   Red Dye Other (See Comments)   GI upset      Medication List    STOP taking these medications   Accu-Chek Guide Me w/Device Kit   Accu-Chek Guide test strip Generic drug: glucose blood   Accu-Chek Softclix Lancets lancets   aspirin 81 MG chewable tablet   labetalol 200 MG tablet Commonly known as: NORMODYNE   metFORMIN 1000 MG tablet Commonly known as: GLUCOPHAGE   ondansetron 4 MG disintegrating tablet Commonly known as: Zofran ODT     TAKE these medications   acetaminophen 325 MG tablet Commonly known as: TYLENOL Take 650 mg by mouth every 6 (six) hours as needed  for mild pain, fever or headache.   amLODipine 5 MG tablet Commonly known as: NORVASC Take 1 tablet (5 mg total) by mouth daily.   cholecalciferol 1000 units tablet Commonly known as: VITAMIN D Take 10,000 Units by mouth daily.   coconut oil Oil Apply 1 application topically as needed.   ibuprofen 600 MG tablet Commonly known as: ADVIL Take 1 tablet (600 mg total) by mouth every 6 (six) hours as needed.   omeprazole 20 MG capsule Commonly known as: PRILOSEC TAKE 1 CAPSULE BY MOUTH DAILY   prenatal multivitamin Tabs tablet Take 1 tablet by mouth daily at 12 noon.   thyroid 90 MG tablet Commonly known as: ARMOUR Take 1 tablet (90 mg total) by mouth daily before breakfast. What changed:   medication strength  how much to take        Discharge home in stable condition Infant Feeding: Breast Infant Disposition:home with mother Discharge instruction: per After Visit Summary and Postpartum booklet. Activity: Advance as tolerated. Pelvic rest for 6 weeks.  Diet: carb modified diet Future Appointments: Future Appointments  Date Time Provider [place]  10/28/2019 11:10 AM [facility] NURSE [facility]  11/25/2019 11:50 AM Roma Schanz, CNM [facility]   Follow up Visit: Please schedule this patient for a In person postpartum visit in 4 weeks with the following provider: Any provider. Additional Postpartum F/U:2 hour GTT and BP check 1 week, TSH check in 6-8 wks High risk pregnancy complicated by: GDM and HTN Delivery mode:  Vaginal, Spontaneous  Anticipated Birth Control:  POPs   10/23/2019 Talitha Givens, MD PGY1  I spoke with and examined patient and agree with resident/PA-S/MS/SNM's note and plan of care. D/C on norvasc 71m. Needs bp check 1wk KRoma Schanz CNM, [facility]10/06/2019 8:58 AM

## 2019-10-21 NOTE — Progress Notes (Signed)
Labor Progress Note Cheyenne Moore is a 29 y.o. G2P0010 at [redacted]w[redacted]d presented for IOL A2GDM, CHTN  S:  Patient feeling rectal pressure.   O:  BP (!) 155/81   Pulse 98   Temp 98.5 F (36.9 C) (Oral)   Resp 18   Ht 5\' 6"  (1.676 m)   Wt (!) 148.6 kg   LMP 01/18/2019   SpO2 97%   BMI 52.89 kg/m   Fetal Tracing:  Baseline:  145 Variability: mdoerate Accels: none Decels: none   Toco: 1-4   CVE: Dilation: 10 Dilation Complete Date: 10/21/19 Dilation Complete Time: 0118 Effacement (%): 90 Cervical Position: Posterior Station: Plus 1 Presentation: Vertex Exam by:: Dr. [email or web address]   A&P: 29 y.o. G2P0010 [redacted]w[redacted]d IOL A2GDM, CHTN #Labor: Patient to start pushing #Pain: epidural #FWB: Cat 1 #GBS positive  [redacted]w[redacted]d, CNM 2:30 AM

## 2019-10-22 LAB — GLUCOSE, CAPILLARY: Glucose-Capillary: 76 mg/dL (ref 70–99)

## 2019-10-22 NOTE — Lactation Note (Signed)
This note was copied from a Cheyenne's chart. Lactation Consultation Note  Patient Name: Cheyenne Moore Date: 10/22/2019 Reason for consult: Follow-up assessment;Maternal endocrine disorder Type of Endocrine Disorder?: Diabetes Cheyenne Moore now 39 hours old breastfeeding on arrival.  Mom reports comfort.  Infant has had adequate voids and stools and 5 percent weight loss.  Mom reports she does not have a breastpump for home use. Referral sent to [facility].  Infant was on right breast on arrival.  Infant let go and mom tied to burp her and offer left.  Infant would not latch but still cuing.  Tried prepumping and she still would not latch. Showed hand expression and spoon feeding to rouse infant.  Infant roused and latched easily on left breast.  Came off and on a few times. Discussed massage/hand expression and pumping with mom due to GDM. Urged mom to pump during the days as much as she can.  Discussed post pumping for 15 minutes for stimulation.  Mom reports positive breasts changes with pregnancy.  Mom reports breasts tender in the beginning of pregnancy.Left mom and Cheyenne breastfeeding.  Let mom know LC would come back to initiate pumping with DEBP.  Urged to call lactation as needed.  Maternal Data    Feeding Feeding Type: Breast Fed  LATCH Score Latch: Grasps breast easily, tongue down, lips flanged, rhythmical sucking.  Audible Swallowing: A few with stimulation  Type of Nipple: Everted at rest and after stimulation (left short shaft)  Comfort (Breast/Nipple): Soft / non-tender  Hold (Positioning): Assistance needed to correctly position infant at breast and maintain latch.  LATCH Score: 8  Interventions Interventions: Breast feeding basics reviewed;Assisted with latch;Hand express;Pre-pump if needed;Expressed milk;Coconut oil;Hand pump;DEBP  Lactation Tools Discussed/Used Tools: Pump;Coconut oil WIC Program: Yes Pump Review: Setup, frequency, and  cleaning Initiated by:: LC Date initiated:: 10/22/19   Consult Status Consult Status: Follow-up Date: 10/22/19 Follow-up type: In-patient    [facility] Michaelle Copas 10/22/2019, 3:32 PM

## 2019-10-22 NOTE — Progress Notes (Addendum)
POSTPARTUM PROGRESS NOTE  Post Partum Day 1  Subjective:  Cheyenne Moore is a 29 y.o. G2P1011 s/p VD at [redacted]w[redacted]d (IOL for cHTN and GDM).  She reports she is doing well. No acute events overnight. She denies any problems with ambulating, voiding or po intake. Denies nausea or vomiting.  Pain is well controlled.  Lochia is decreasing.  Objective: Blood pressure (!) 145/97, pulse 84, temperature 97.9 F (36.6 C), temperature source Oral, resp. rate 19, height 5\' 6"  (1.676 m), weight (!) 148.6 kg, last menstrual period 01/18/2019, SpO2 97 %, unknown if currently breastfeeding.  Physical Exam:  General: alert, cooperative and no distress Chest: no respiratory distress Skin: warm, dry  Recent Labs    10/20/19 1406  HGB 11.2*  HCT 35.4*    Assessment/Plan: Cheyenne Moore is a 29 y.o. G2P1011 s/p VD at [redacted]w[redacted]d   PPD#1 - Doing well. Continue routine postpartum care.   #Triple I: s/p amp/gent, afebrile for past 24 hrs #Chronic HTN: follow pressures, consider restarting medications  #A1GDM: CBG 118, will need 6 wk 2hr gtt as outpatient   #Contraception: POPs #Feeding: breast  Dispo: Plan for discharge tomorrow.   LOS: 4 days   [redacted]w[redacted]d MD, PGY-1 OBGYN Faculty Teaching Service  10/22/2019, 2:27 AM   I saw and evaluated the patient. I agree with the findings and the plan of care as documented in the resident's note.  12/22/2019, MD [facility] Family Medicine Fellow, [facility] for [facility], [facility]

## 2019-10-23 ENCOUNTER — Other Ambulatory Visit (HOSPITAL_COMMUNITY): Payer: Self-pay | Admitting: Obstetrics & Gynecology

## 2019-10-23 MED ORDER — THYROID 90 MG PO TABS: 90.0000 mg | ORAL_TABLET | Freq: Every day | ORAL | 0 refills | Status: DC

## 2019-10-23 MED ORDER — COCONUT OIL OIL: 1.0000 "application " | TOPICAL_OIL | 0 refills | Status: DC | PRN

## 2019-10-23 MED ORDER — IBUPROFEN 600 MG PO TABS
600.0000 mg | ORAL_TABLET | Freq: Four times a day (QID) | ORAL | 0 refills | Status: DC | PRN
Start: 2019-10-23 — End: 2019-10-23

## 2019-10-23 MED ORDER — AMLODIPINE BESYLATE 5 MG PO TABS: 5.0000 mg | ORAL_TABLET | Freq: Every day | ORAL | 0 refills | Status: DC

## 2019-10-23 MED FILL — AMLODIPINE BESYLATE 5 MG TA: 5 | 30 days supply | Qty: 30 | Fill #0

## 2019-10-23 MED FILL — ARMOUR THYROID 90 MG TABLET: 90 | 30 days supply | Qty: 30 | Fill #0

## 2019-10-23 MED FILL — IBUPROFEN 600 MG TABLET: 600 | 8 days supply | Qty: 30 | Fill #0

## 2019-10-23 NOTE — Lactation Note (Signed)
This note was copied from a baby's chart. Lactation Consultation Note  Patient Name: Cheyenne Moore [id number] Date: 10/23/2019 Reason for consult: Follow-up assessment  P1 mom holding infant STS.  Infant rooting.  Mom is pumping and states infant latched one time yesterday successfully.  LC observed mom try to latch infant. Infant would open to latch, then loose the latch and nipple would slip from the mouth.  Mom tried latching in cross cradle to provide more head support; she had been latching in cradle also, mom has large breasts and more support was needed so a rolled cloth was placed under breast.  LC had infant suck gloved finger/ infant tongue instantly and continuously went to roof of mouth.  Visible lingual frenulum noted with midline attachment.  Once tongue was below finger, infant rhythmically sucked and suck training discussed with mom.   Infant going home today so LC offered NS size 20 to see if infant could sustain latch.  Football hold used and support pillows used.  Mom understands this support is a must.   Infant did latch quickly to shield and sustained latch without slipping once.  Infant had multiple spontaneous audible swallows with strong jaw movement noted along with continuous sucking. Mom had milk leaking from other breast and her breasts are filling.  Infant fed for 15 minutes and was still feeding when LC left room.  Mom was provided information on OP LC appt. In order to FU with mom regarding the need to pump and NS use.    Engorgement prevention and pacifier use reviewed as well.  LC praised mom for efforts and encouraged her to continue to put infant to the breast on demand.    Maternal Data    Feeding Feeding Type: Breast Fed  LATCH Score Latch: Too sleepy or reluctant, no latch achieved, no sucking elicited.  Audible Swallowing: None  Type of Nipple: Everted at rest and after stimulation  Comfort (Breast/Nipple): Soft / non-tender  Hold  (Positioning): Assistance needed to correctly position infant at breast and maintain latch.  LATCH Score: 5  Interventions Interventions: Breast feeding basics reviewed;Assisted with latch;Skin to skin;Breast massage;Hand express;Position options;Support pillows;Adjust position  Lactation Tools Discussed/Used     Consult Status Consult Status: Complete Date: 10/23/19 Follow-up type: In-patient    Maryruth Hancock [facility] 10/23/2019, 8:29 AM

## 2019-10-28 ENCOUNTER — Encounter: Payer: Self-pay | Admitting: *Deleted

## 2019-10-28 ENCOUNTER — Telehealth (INDEPENDENT_AMBULATORY_CARE_PROVIDER_SITE_OTHER): Payer: Medicaid Other | Admitting: *Deleted

## 2019-10-28 VITALS — BP 159/111 | HR 73 | Ht 66.0 in | Wt 295.0 lb

## 2019-10-28 DIAGNOSIS — Z0130 Encounter for examination of blood pressure without abnormal findings: Secondary | ICD-10-CM

## 2019-10-28 NOTE — Progress Notes (Addendum)
   I connected with  Cheyenne Moore on 11/14/19 by a video enabled telemedicine application and verified that I am speaking with the correct person using two identifiers.   I discussed the limitations of evaluation and management by telemedicine. The patient expressed understanding and agreed to proceed.    NURSE VISIT- BLOOD PRESSURE CHECK  SUBJECTIVE:  Cheyenne Moore is a 29 y.o. G49P1011 female here for BP check. She is postpartum, delivery date 10/21/19    HYPERTENSION ROS:  Pregnant/postpartum:  . Severe headaches that don't go away with tylenol/other medicines: No  . Visual changes (seeing spots/double/blurred vision) No  . Severe pain under right breast breast or in center of upper chest No  . Severe nausea/vomiting No  . Taking medicines as instructed yes  OBJECTIVE:  BP (!) 159/111 (BP Location: Left Arm, Patient Position: Sitting, Cuff Size: Large)   Pulse 73   Ht 5\' 6"  (1.676 m)   Wt 295 lb (133.8 kg)   Breastfeeding Yes   BMI 47.61 kg/m   Appearance alert, well appearing, and in no distress.  ASSESSMENT: Postpartum  blood pressure check  PLAN: Discussed with Dr.   Recommendations: increase Norvasc to 10 mg one a day   Follow-up: on Friday for a BP check; can be a MyChart visit.   Monday  10/28/2019 11:51 AM   Attestation of Attending Supervision of Nursing Visit Encounter: Evaluation and management procedures were performed by the nursing staff under my supervision and collaboration.  I have reviewed the nurse's note and chart, and I agree with the management and plan.  12/28/2019 MD Attending Physician for the Center for [facility] 11/06/2019 8:48 PM

## 2019-11-01 ENCOUNTER — Telehealth (INDEPENDENT_AMBULATORY_CARE_PROVIDER_SITE_OTHER): Payer: Medicaid Other | Admitting: *Deleted

## 2019-11-01 VITALS — BP 140/92 | HR 89

## 2019-11-01 DIAGNOSIS — Z0131 Encounter for examination of blood pressure with abnormal findings: Secondary | ICD-10-CM

## 2019-11-01 DIAGNOSIS — Z0130 Encounter for examination of blood pressure without abnormal findings: Secondary | ICD-10-CM

## 2019-11-01 NOTE — Progress Notes (Addendum)
   I connected with  Cheyenne Moore on 11/14/19 by a video enabled telemedicine application and verified that I am speaking with the correct person using two identifiers.Patient is located at home Provider is located at [facility].     I discussed the limitations of evaluation and management by telemedicine. The patient expressed understanding and agreed to proceed.    NURSE VISIT- BLOOD PRESSURE  CHECK  SUBJECTIVE:  Cheyenne Moore is a 29 y.o. G21P1011 female here for BP check. She is postpartum, delivery date 10/21/19    HYPERTENSION ROS:  Pregnant/postpartum:  . Severe headaches that don't go away with tylenol/other medicines: No  . Visual changes (seeing spots/double/blurred vision) No  . Severe pain under right breast breast or in center of upper chest No  . Severe nausea/vomiting No  . Taking medicines as instructed yes  OBJECTIVE:  BP (!) 140/92 (BP Location: Right Arm)   Pulse 89   Breastfeeding Yes   Appearance alert, well appearing, and in no distress.  ASSESSMENT: Postpartum  blood pressure check  PLAN: Discussed with Joellyn Haff, CNM, [facility]   Recommendations: pt to continue taking Norvasc 10 mg once daily and add HCTZ.    Follow-up: on Monday for BP check in office   Malachy Mood  11/01/2019 1:42 PM   Chart reviewed for nurse visit. Agree with plan of care. Tried to send HCTZ, flagged b/c has allergy to red dye, pt worried that it may decrease milk. Will hold off and see what her bp is when she comes to office Monday for bp check w/ nurse. Note sent to pt.  Cheral Marker, [place] 11/01/2019 3:45 PM

## 2019-11-04 ENCOUNTER — Other Ambulatory Visit: Payer: Self-pay | Admitting: Women's Health

## 2019-11-04 ENCOUNTER — Other Ambulatory Visit: Payer: Self-pay

## 2019-11-04 ENCOUNTER — Ambulatory Visit (INDEPENDENT_AMBULATORY_CARE_PROVIDER_SITE_OTHER): Payer: Medicaid Other | Admitting: *Deleted

## 2019-11-04 VITALS — BP 126/89 | HR 74

## 2019-11-04 DIAGNOSIS — Z0130 Encounter for examination of blood pressure without abnormal findings: Secondary | ICD-10-CM

## 2019-11-04 NOTE — Progress Notes (Addendum)
  I connected with  Cheyenne Moore on 11/15/19 by a video enabled telemedicine application and verified that I am speaking with the correct person using two identifiers.   I discussed the limitations of evaluation and management by telemedicine. The patient expressed understanding and agreed to proceed.   Patient was at her home and I was in the office at [facility].  NURSE VISIT- BLOOD PRESSURE CHECK  SUBJECTIVE:  Cheyenne Moore is a 29 y.o. G72P1011 female here for BP check. She is postpartum, delivery date 10/21/19    HYPERTENSION ROS:  Pregnant/postpartum:  . Severe headaches that don't go away with tylenol/other medicines: No  . Visual changes (seeing spots/double/blurred vision) No  . Severe pain under right breast breast or in center of upper chest No  . Severe nausea/vomiting No  . Taking medicines as instructed yes   OBJECTIVE:  BP 126/89 (BP Location: Left Arm, Patient Position: Sitting, Cuff Size: Normal)   Pulse 74   Appearance alert, well appearing, and in no distress.  ASSESSMENT: Postpartum  blood pressure check  PLAN: Discussed with Joellyn Haff, CNM, [facility]   Recommendations: stop medicine 2 days before next visit   Follow-up: as scheduled   Annamarie Dawley  11/04/2019 3:52 PM   Chart reviewed for nurse visit. Agree with plan of care.  Cheral Marker, [place] 11/04/2019 4:54 PM

## 2019-11-11 ENCOUNTER — Other Ambulatory Visit: Payer: Self-pay | Admitting: Women's Health

## 2019-11-11 ENCOUNTER — Telehealth: Payer: Self-pay | Admitting: Obstetrics & Gynecology

## 2019-11-11 MED ORDER — AMLODIPINE BESYLATE 10 MG PO TABS: 10.0000 mg | ORAL_TABLET | Freq: Every day | ORAL | 0 refills | Status: DC

## 2019-11-11 NOTE — Telephone Encounter (Signed)
Patient states she had norvasc bp medicine filled at the hospital after she had her baby. Patient states she needs refill and would like to get refills through our office sent to her pharmacy [facility] on [address]. Please advise if we need to schedule patient for in person appt regarding medication refill.

## 2019-11-11 NOTE — Telephone Encounter (Signed)
Pt aware med was sent to pharmacy. JSY °

## 2019-11-25 ENCOUNTER — Ambulatory Visit (INDEPENDENT_AMBULATORY_CARE_PROVIDER_SITE_OTHER): Payer: Medicaid Other | Admitting: Women's Health

## 2019-11-25 ENCOUNTER — Encounter: Payer: Self-pay | Admitting: Women's Health

## 2019-11-25 DIAGNOSIS — O99285 Endocrine, nutritional and metabolic diseases complicating the puerperium: Secondary | ICD-10-CM

## 2019-11-25 DIAGNOSIS — Z3202 Encounter for pregnancy test, result negative: Secondary | ICD-10-CM | POA: Diagnosis not present

## 2019-11-25 DIAGNOSIS — Z8632 Personal history of gestational diabetes: Secondary | ICD-10-CM

## 2019-11-25 DIAGNOSIS — I10 Essential (primary) hypertension: Secondary | ICD-10-CM

## 2019-11-25 DIAGNOSIS — Z3009 Encounter for other general counseling and advice on contraception: Secondary | ICD-10-CM

## 2019-11-25 DIAGNOSIS — O1093 Unspecified pre-existing hypertension complicating the puerperium: Secondary | ICD-10-CM

## 2019-11-25 DIAGNOSIS — E039 Hypothyroidism, unspecified: Secondary | ICD-10-CM

## 2019-11-25 LAB — POCT URINE PREGNANCY: Preg Test, Ur: NEGATIVE

## 2019-11-25 MED ORDER — NORETHINDRONE 0.35 MG PO TABS: 1.0000 | ORAL_TABLET | Freq: Every day | ORAL | 11 refills | Status: DC

## 2019-11-25 MED ORDER — AMLODIPINE BESYLATE 5 MG PO TABS: 5.0000 mg | ORAL_TABLET | Freq: Every day | ORAL | 0 refills | Status: DC

## 2019-11-25 NOTE — Patient Instructions (Addendum)
Stop norvasc 2 days before your sugar test visit  You will have your sugar test next visit.  Please do not eat or drink anything after midnight the night before you come, not even water.  You will be here for at least two hours.  Please make an appointment online for the bloodwork at [email or web address] for 8:30am (or as close to this as possible). Make sure you select the [facility] service center. The day of the appointment, check in with our office first, then you will go to [facility] to start the sugar test.     Norethindrone tablets (contraception) What is this medicine? NORETHINDRONE (nor eth IN drone) is an oral contraceptive. The product contains a female hormone known as a progestin. It is used to prevent pregnancy. This medicine may be used for other purposes; ask your health care provider or pharmacist if you have questions. COMMON BRAND NAME(S): Camila, Deblitane 28-Day, Errin, Heather, [place], [place], [place], Nor-QD, Nora-BE, Norlyroc, Ortho Micronor, [employer] 28-Day What should I tell my health care provider before I take this medicine? They need to know if you have any of these conditions:  blood vessel disease or blood clots  breast, cervical, or vaginal cancer  diabetes  heart disease  kidney disease  liver disease  mental depression  migraine  seizures  stroke  vaginal bleeding  an unusual or allergic reaction to norethindrone, other medicines, foods, dyes, or preservatives  pregnant or trying to get pregnant  breast-feeding How should I use this medicine? Take this medicine by mouth with a glass of water. You may take it with or without food. Follow the directions on the prescription label. Take this medicine at the same time each day and in the order directed on the package. Do not take your medicine more often than directed. Contact your pediatrician regarding the use of this medicine in children. Special care may be needed. This medicine has been used in female  children who have started having menstrual periods. A patient package insert for the product will be given with each prescription and refill. Read this sheet carefully each time. The sheet may change frequently. Overdosage: If you think you have taken too much of this medicine contact a poison control center or emergency room at once. NOTE: This medicine is only for you. Do not share this medicine with others. What if I miss a dose? Try not to miss a dose. Every time you miss a dose or take a dose late your chance of pregnancy increases. When 1 pill is missed (even if only 3 hours late), take the missed pill as soon as possible and continue taking a pill each day at the regular time (use a back up method of birth control for the next 48 hours). If more than 1 dose is missed, use an additional birth control method for the rest of your pill pack until menses occurs. Contact your health care professional if more than 1 dose has been missed. What may interact with this medicine? Do not take this medicine with any of the following medications:  amprenavir or fosamprenavir  bosentan This medicine may also interact with the following medications:  antibiotics or medicines for infections, especially rifampin, rifabutin, rifapentine, and griseofulvin, and possibly penicillins or tetracyclines  aprepitant  barbiturate medicines, such as phenobarbital  carbamazepine  felbamate  modafinil  oxcarbazepine  phenytoin  ritonavir or other medicines for HIV infection or AIDS  St. John's wort  topiramate This list may not describe all possible  interactions. Give your health care provider a list of all the medicines, herbs, non-prescription drugs, or dietary supplements you use. Also tell them if you smoke, drink alcohol, or use illegal drugs. Some items may interact with your medicine. What should I watch for while using this medicine? Visit your doctor or health care professional for regular  checks on your progress. You will need a regular breast and pelvic exam and Pap smear while on this medicine. Use an additional method of birth control during the first cycle that you take these tablets. If you have any reason to think you are pregnant, stop taking this medicine right away and contact your doctor or health care professional. If you are taking this medicine for hormone related problems, it may take several cycles of use to see improvement in your condition. This medicine does not protect you against HIV infection (AIDS) or any other sexually transmitted diseases. What side effects may I notice from receiving this medicine? Side effects that you should report to your doctor or health care professional as soon as possible:  breast tenderness or discharge  pain in the abdomen, chest, groin or leg  severe headache  skin rash, itching, or hives  sudden shortness of breath  unusually weak or tired  vision or speech problems  yellowing of skin or eyes Side effects that usually do not require medical attention (report to your doctor or health care professional if they continue or are bothersome):  changes in sexual desire  change in menstrual flow  facial hair growth  fluid retention and swelling  headache  irritability  nausea  weight gain or loss This list may not describe all possible side effects. Call your doctor for medical advice about side effects. You may report side effects to FDA at 1-800-FDA-1088. Where should I keep my medicine? Keep out of the reach of children. Store at room temperature between 15 and 30 degrees C (59 and 86 degrees F). Throw away any unused medicine after the expiration date. NOTE: This sheet is a summary. It may not cover all possible information. If you have questions about this medicine, talk to your doctor, pharmacist, or health care provider.  2020 Elsevier/Gold Standard (2011-09-23 16:41:35)

## 2019-11-25 NOTE — Progress Notes (Signed)
POSTPARTUM VISIT Patient name: Cheyenne Moore MRN [id number]  Date of birth: [birth year] Chief Complaint:   Postpartum Care and Contraception  History of Present Illness:   Cheyenne Moore is a 29 y.o. G48P1011 Caucasian female being seen today for a postpartum visit. She is 5 weeks postpartum following a spontaneous vaginal delivery at 39.3 gestational weeks. IOL: Yes, for A2DM, CHTN. Anesthesia: epidural.  Laceration: 2nd degree.  Complications: none. Inpatient contraception: no.   Pregnancy complicated by W2OV, CHTN dx in pregnancy, hypothyroidism Tobacco use: no. Substance use disorder: no. Last pap smear: 10/31/17 and results were normal. Next pap smear due: Oct 2022 No LMP recorded.  Postpartum course has been uncomplicated. D/c'd home on norvasc, stopped 2d ago as directed. Bleeding had stopped, had some bright red bleeding the other day. Bowel function is normal. Bladder function is normal. Urinary incontinence? No, fecal incontinence? No Patient is not sexually active. Last sexual activity: prior to birth of baby.  Desired contraception: POPs. Patient does want a pregnancy in the future.  Desired family size is 3 children.   The pregnancy intention screening data noted above was reviewed. Potential methods of contraception were discussed. The patient elected to proceed with Oral Contraceptive.   Edinburgh Postpartum Depression Screening: negative  Edinburgh Postnatal Depression Scale - 11/25/19 1210      Edinburgh Postnatal Depression Scale:  In the Past 7 Days   I have been able to laugh and see the funny side of things. 0    I have looked forward with enjoyment to things. 0    I have blamed myself unnecessarily when things went wrong. 0    I have been anxious or worried for no good reason. 1    I have felt scared or panicky for no good reason. 1    Things have been getting on top of me. 1    I have been so unhappy that I have had difficulty sleeping. 0    I have felt sad or  miserable. 1    I have been so unhappy that I have been crying. 1    The thought of harming myself has occurred to me. 0    Edinburgh Postnatal Depression Scale Total 5          Baby's course has been uncomplicated. Baby is feeding by breast: milk supply adequate. Infant has a pediatrician/family doctor? Yes.  Childcare strategy if returning to work/school: n/a.  Pt has material needs met for her and baby: Yes.   Review of Systems:   Pertinent items are noted in HPI Denies Abnormal vaginal discharge w/ itching/odor/irritation, headaches, visual changes, shortness of breath, chest pain, abdominal pain, severe nausea/vomiting, or problems with urination or bowel movements. Pertinent History Reviewed:  Reviewed past medical,surgical, obstetrical and family history.  Reviewed problem list, medications and allergies. OB History  Gravida Para Term Preterm AB Living  2 1 1  0 1 1  SAB TAB Ectopic Multiple Live Births  1 0 0 0 1    # Outcome Date GA Lbr Len/2nd Weight Sex Delivery Anes PTL Lv  2 Term 10/21/19 73w3d12:38 / 02:08 7 lb 13 oz (3.544 kg) F Vag-Spont EPI  LIV     Birth Comments: WDL  1 SAB 07/01/18 126w2d          Birth Comments: 9.4 week sized fetus   Physical Assessment:   Vitals:   11/25/19 1208 11/25/19 1217  BP: (!) 140/95 130/88  Pulse: 87 88  Weight: 283 lb 6.4 oz (128.5 kg)   Height: 5' 6"  (1.676 m)   Body mass index is 45.74 kg/m.       Physical Examination:   General appearance: alert, well appearing, and in no distress  Mental status: alert, oriented to person, place, and time  Skin: warm & dry   Cardiovascular: normal heart rate noted   Respiratory: normal respiratory effort, no distress   Breasts: deferred, no complaints   Abdomen: soft, non-tender   Pelvic: VULVA: sutures still present, healing well, small spot at introitus that looks like stitch may have popped- probable source of pt's recent BRB, healing well  Rectal: no hemorrhoids  Extremities: no  edema  Chaperone: Marcelino Scot         Results for orders placed or performed in visit on 11/25/19 (from the past 24 hour(s))  POCT urine pregnancy   Collection Time: 11/25/19 12:12 PM  Result Value Ref Range   Preg Test, Ur Negative Negative    Assessment & Plan:  1) Postpartum exam 2) 5 wks s/p spontaneous vaginal delivery after IOL for A2DM and CHTN 3) breast feeding 4) Depression screening 5) Contraception management> Rx micronor w/ 11RF, understands has to take at exact same time daily to be effective, if late taking use condom as back-up  6) A2DM during pregnancy> GTT in 3wks 7) CHTN dx mid pregnancy> BP mildly elevated, restart norvasc @ 62m, stop 2d before next visit in 3wks, will do bp check w/ nurse  Essential components of care per ACOG recommendations:  1.  Mood and well being:  . If positive depression screen, discussed and plan developed.  . If using tobacco we discussed reduction/cessation and risk of relapse . If current substance abuse, we discussed and referral to local resources was offered.   2. Infant care and feeding:  . If breastfeeding, discussed returning to work, pumping, breastfeeding-associated pain, guidance regarding return to fertility while lactating if not using another method. If needed, patient was provided with a letter to be allowed to pump q 2-3hrs to support lactation in a private location with access to a refrigerator to store breastmilk.   . Recommended that all caregivers be immunized for flu, pertussis and other preventable communicable diseases . If pt does not have material needs met for her/baby, referred to local resources for help obtaining these.  3. Sexuality, contraception and birth spacing . Provided guidance regarding sexuality, management of dyspareunia, and resumption of intercourse . Discussed avoiding interpregnancy interval <616ms and recommended birth spacing of 18 months  4. Sleep and fatigue . Discussed coping options for  fatigue and sleep disruption . Encouraged family/partner/community support of 4 hrs of uninterrupted sleep to help with mood and fatigue  5. Physical recovery  . If pt had a C/S, assessed incisional pain and providing guidance on normal vs prolonged recovery . If pt had a laceration, perineal healing and pain reviewed.  . If urinary or fecal incontinence, discussed management and referred to PT or uro/gyn if indicated  . Patient is safe to resume physical activity. Discussed attainment of healthy weight.  6.  Chronic disease management . Discussed pregnancy complications if any, and their implications for future childbearing and long-term maternal health. . Review recommendations for prevention of recurrent pregnancy complications, such as 17 hydroxyprogesterone caproate to reduce risk for recurrent PTB not applicable, or aspirin to reduce risk of preeclampsia not applicable. . Pt had GDM: Yes. If yes, 2hr GTT scheduled: yes. . Reviewed medications and non-pregnant  dosing including consideration of whether pt is breastfeeding using a reliable resource such as LactMed: yes . Referred for f/u w/ PCP or subspecialist providers as indicated: not applicable  7. Health maintenance . Mammogram at 27yo or earlier if indicated . Pap smears as indicated  Meds:  Meds ordered this encounter  Medications  . amLODipine (NORVASC) 5 MG tablet    Sig: Take 1 tablet (5 mg total) by mouth daily.    Dispense:  30 tablet    Refill:  0    Order Specific Question:   Supervising Provider    Answer:   Elonda Husky, LUTHER H [[id number]]  . norethindrone (MICRONOR) 0.35 MG tablet    Sig: Take 1 tablet (0.35 mg total) by mouth daily.    Dispense:  28 tablet    Refill:  11    Order Specific Question:   Supervising Provider    Answer:   Florian Buff [[id number]]    Follow-up: Return in about 3 weeks (around 12/16/2019) for 2hr sugar test and bp check w/ nurse.   Orders Placed This Encounter  Procedures  . POCT urine  pregnancy    [place], [facility] 11/25/2019 12:44 PM

## 2019-11-29 ENCOUNTER — Telehealth: Payer: Self-pay | Admitting: Obstetrics & Gynecology

## 2019-11-29 ENCOUNTER — Encounter: Payer: Self-pay | Admitting: *Deleted

## 2019-11-29 ENCOUNTER — Other Ambulatory Visit: Payer: Self-pay | Admitting: *Deleted

## 2019-11-29 MED ORDER — THYROID 90 MG PO TABS
90.0000 mg | ORAL_TABLET | Freq: Every day | ORAL | 11 refills | Status: DC
Start: 2019-11-29 — End: 2020-12-12

## 2019-11-29 NOTE — Telephone Encounter (Signed)
Pt requesting refill on thyroid (ARMOUR) 90mg   States it was originally ordered through hospital  Please advise & notify pt   [address]

## 2019-12-09 ENCOUNTER — Other Ambulatory Visit: Payer: Self-pay | Admitting: Women's Health

## 2019-12-16 ENCOUNTER — Other Ambulatory Visit: Payer: Medicaid Other

## 2019-12-23 ENCOUNTER — Other Ambulatory Visit: Payer: Medicaid Other

## 2019-12-25 ENCOUNTER — Other Ambulatory Visit: Payer: Self-pay | Admitting: Women's Health

## 2020-01-06 ENCOUNTER — Other Ambulatory Visit: Payer: Self-pay | Admitting: Women's Health

## 2020-01-21 ENCOUNTER — Other Ambulatory Visit: Payer: Medicaid Other

## 2020-01-24 ENCOUNTER — Other Ambulatory Visit: Payer: Self-pay

## 2020-01-24 ENCOUNTER — Other Ambulatory Visit: Payer: Medicaid Other

## 2020-01-24 DIAGNOSIS — Z20822 Contact with and (suspected) exposure to covid-19: Secondary | ICD-10-CM

## 2020-01-28 LAB — SPECIMEN STATUS REPORT

## 2020-01-28 LAB — NOVEL CORONAVIRUS, NAA: SARS-CoV-2, NAA: NOT DETECTED

## 2020-02-03 ENCOUNTER — Encounter: Payer: Self-pay | Admitting: *Deleted

## 2020-02-04 ENCOUNTER — Other Ambulatory Visit: Payer: Medicaid Other

## 2020-02-04 ENCOUNTER — Telehealth (INDEPENDENT_AMBULATORY_CARE_PROVIDER_SITE_OTHER): Payer: Medicaid Other | Admitting: *Deleted

## 2020-02-04 ENCOUNTER — Other Ambulatory Visit: Payer: Self-pay

## 2020-02-04 VITALS — BP 135/92

## 2020-02-04 DIAGNOSIS — I10 Essential (primary) hypertension: Secondary | ICD-10-CM

## 2020-02-04 DIAGNOSIS — Z0130 Encounter for examination of blood pressure without abnormal findings: Secondary | ICD-10-CM | POA: Diagnosis not present

## 2020-02-04 NOTE — Progress Notes (Addendum)
     NURSE VISIT- BLOOD PRESSURE CHECK  I connected with Cheyenne Moore on 1/18//22 by MyChart video and verified that I am speaking with the correct person using two identifiers.   I discussed the limitations of evaluation and management by telemedicine. The patient expressed understanding and agreed to proceed.  Nurse is at the office, and patient is at home.   SUBJECTIVE:  Cheyenne Moore is a 30 y.o. G75P1011 female here for BP check. She is postpartum, delivery date 10/21/19    HYPERTENSION ROS:  Ppostpartum:  . Severe headaches that don't go away with tylenol/other medicines: No  . Visual changes (seeing spots/double/blurred vision) No  . Severe pain under right breast breast or in center of upper chest No  . Severe nausea/vomiting No  . Taking medicines as instructed yes   OBJECTIVE:  BP (!) 135/92 (BP Location: Right Arm, Patient Position: Sitting)   Breastfeeding Yes   Appearance alert, well appearing, and in no distress and oriented to person, place, and time.  ASSESSMENT: Postpartum  blood pressure check  PLAN: Discussed with Joellyn Haff, CNM, [facility]   Recommendations: Increase Norvasc to 10mg  daily   Follow-up: in 1 week for pp glucola and bp check    02/04/2020 2:59 PM   Chart reviewed for nurse visit. Agree with plan of care. New rx sent for norvasc 10mg  02/06/2020, CNM 02/05/2020 1:10 PM

## 2020-02-04 NOTE — Patient Instructions (Signed)
1 

## 2020-02-05 MED ORDER — AMLODIPINE BESYLATE 10 MG PO TABS: 10.0000 mg | ORAL_TABLET | Freq: Every day | ORAL | 3 refills | Status: DC

## 2020-02-05 NOTE — Addendum Note (Signed)
Addended by: BOOKER, KIMBERLY R on: 11/25/2020 01:11 PM   Modules accepted: Orders  

## 2020-02-10 ENCOUNTER — Other Ambulatory Visit: Payer: Self-pay | Admitting: Women's Health

## 2020-02-11 ENCOUNTER — Other Ambulatory Visit: Payer: Self-pay

## 2020-02-11 ENCOUNTER — Other Ambulatory Visit: Payer: Medicaid Other

## 2020-02-11 ENCOUNTER — Ambulatory Visit (INDEPENDENT_AMBULATORY_CARE_PROVIDER_SITE_OTHER): Payer: Medicaid Other | Admitting: *Deleted

## 2020-02-11 VITALS — BP 128/86 | HR 89

## 2020-02-11 DIAGNOSIS — Z0130 Encounter for examination of blood pressure without abnormal findings: Secondary | ICD-10-CM

## 2020-02-11 DIAGNOSIS — Z8632 Personal history of gestational diabetes: Secondary | ICD-10-CM

## 2020-02-11 NOTE — Progress Notes (Addendum)
   NURSE VISIT- BLOOD PRESSURE CHECK  SUBJECTIVE:  Cheyenne Moore is a 30 y.o. G71P1011 female here for BP check. She is postpartum, delivery date 10/21/19.    HYPERTENSION ROS:  Pregnant/postpartum:  . Severe headaches that don't go away with tylenol/other medicines: No  . Visual changes (seeing spots/double/blurred vision) No  . Severe pain under right breast breast or in center of upper chest No  . Severe nausea/vomiting No  . Taking medicines as instructed yes, but did not take this morning.    OBJECTIVE:  BP 128/86 (BP Location: Left Arm, Patient Position: Sitting, Cuff Size: Normal)   Pulse 89   Appearance alert, well appearing, and in no distress.  ASSESSMENT: Postpartum  blood pressure check  PLAN: Discussed with Joellyn Haff, CNM, [facility]   Recommendations: return next week for nurse bp check. Pt to bring her cuff.    Follow-up: 1 week   Annamarie Dawley  02/11/2020 9:54 AM   Chart reviewed for nurse visit. Agree with plan of care. Increased norvasc to 10mg  at last check, however she hasn't taken today and bp is good. To check home bp's BID x 1wk- if high, resume meds. If normal, can stay off meds. F/U 1wk for bp check w/ nurse, bring home cuff for correlation.  , Cheral Marker 02/11/2020 7:40 PM

## 2020-02-12 LAB — GLUCOSE TOLERANCE, 2 HOURS W/ 1HR
Glucose, 1 hour: 147 mg/dL (ref 65–179)
Glucose, 2 hour: 81 mg/dL (ref 65–152)
Glucose, Fasting: 91 mg/dL (ref 65–91)

## 2020-02-18 ENCOUNTER — Ambulatory Visit (INDEPENDENT_AMBULATORY_CARE_PROVIDER_SITE_OTHER): Payer: Medicaid Other

## 2020-02-18 ENCOUNTER — Telehealth: Payer: Self-pay

## 2020-02-18 ENCOUNTER — Other Ambulatory Visit: Payer: Self-pay

## 2020-02-18 VITALS — BP 127/91 | HR 92

## 2020-02-18 DIAGNOSIS — O165 Unspecified maternal hypertension, complicating the puerperium: Secondary | ICD-10-CM

## 2020-02-18 NOTE — Progress Notes (Addendum)
   NURSE VISIT- BLOOD PRESSURE CHECK  SUBJECTIVE:  Cheyenne Moore is a 30 y.o. G61P1011 female here for BP check. She is postpartum, delivery date 10/21/19    I connected with  Cheyenne Moore on 02/27/20 by a video enabled telemedicine application and verified that I am speaking with the correct person using two identifiers. I was at [facility] and the pt was at her residence.   I discussed the limitations of evaluation and management by telemedicine. The patient expressed understanding and agreed to proceed.  HYPERTENSION ROS:  Pregnant/postpartum:  . Severe headaches that don't go away with tylenol/other medicines: No  . Visual changes (seeing spots/double/blurred vision) No  . Severe pain under right breast breast or in center of upper chest No  . Severe nausea/vomiting No  . Taking medicines as instructed no  OBJECTIVE:  BP (!) 127/91 (BP Location: Right Arm, Patient Position: Sitting, Cuff Size: Normal)   Pulse 92   LMP 12/12/2019 (Approximate)   Breastfeeding Yes   Appearance oriented to person, place, and time.  ASSESSMENT: Postpartum  blood pressure check  PLAN: Discussed with Joellyn Haff, CNM, [facility]   Recommendations: restart Norvasc 10mg    Follow-up: in 3 months   Aundra Pung A Adarrius Graeff  02/18/2020 9:35 AM   Chart reviewed for nurse visit. Agree with plan of care. Pt is at home. Nurse is at office. Provider who reviewed chart is at office.  04/17/2020, Cheral Marker 02/18/2020 10:30 AM

## 2020-02-18 NOTE — Telephone Encounter (Signed)
Pt stated she is supposed to be taking a amlodipine 10 mg wondering if the script needs to be resent or if she can just call the pharmacy to have them fill this Rx

## 2020-03-02 IMAGING — CT CT ABD-PELV W/ CM
2 of 3 series · 16 of 46 positions shown, 18 images · IV contrast (iopamidol)
Comparison: October 26, 2016

CLINICAL DATA: Right lower quadrant region pain

EXAM:
CT ABDOMEN AND PELVIS WITH CONTRAST
TECHNIQUE: Multidetector CT imaging of the abdomen and pelvis was performed
using the standard protocol following bolus administration of
intravenous contrast. Oral contrast was also administered.
CONTRAST:  100mL PYS0K6-9GG IOPAMIDOL (PYS0K6-9GG) INJECTION 61%

[Series 2: axial st · axial · 0.73mm/px · z∈[+741,+1146]mm · 13 of 95 slices shown, 15 images]
[im 7/95  soft-tissue]
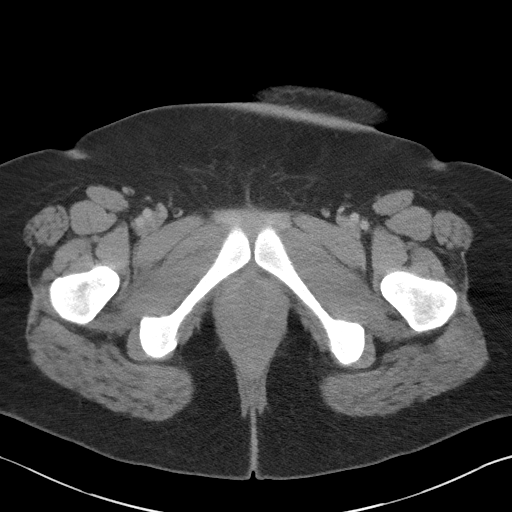
[im 7/95  bone]
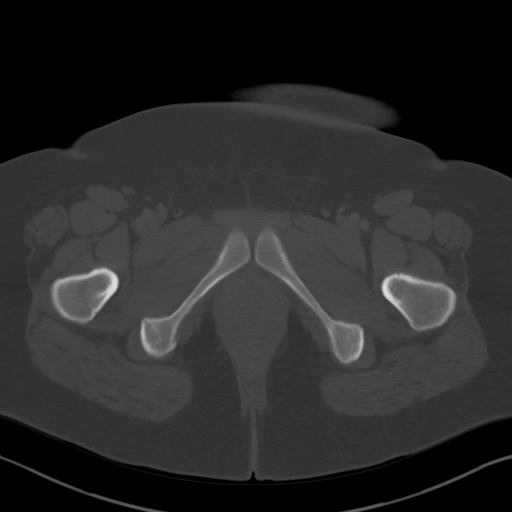
[im 13/95  soft-tissue]
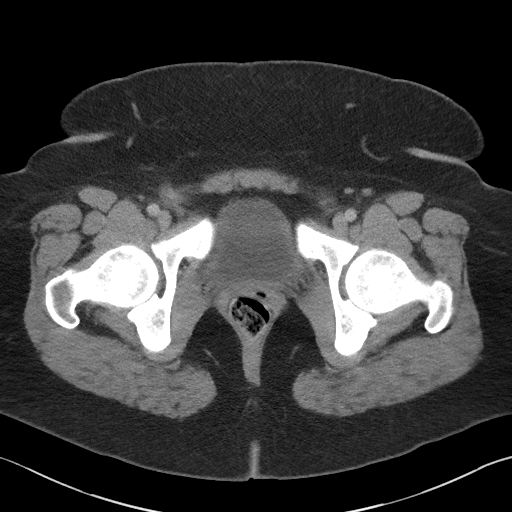
[im 19/95  soft-tissue]
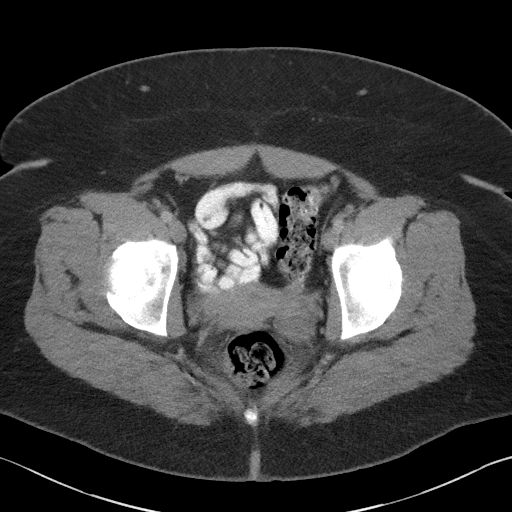
[im 28/95  soft-tissue]
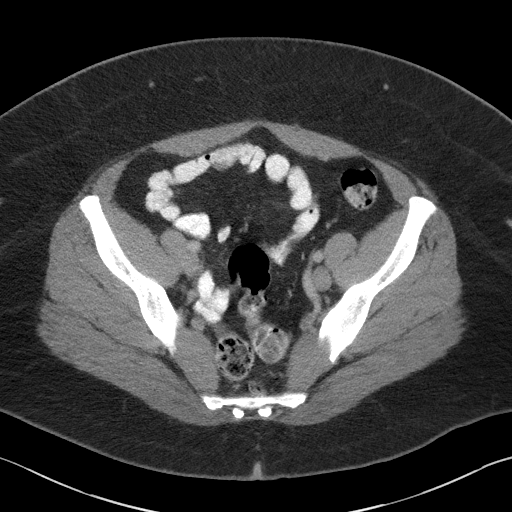
[im 34/95  soft-tissue]
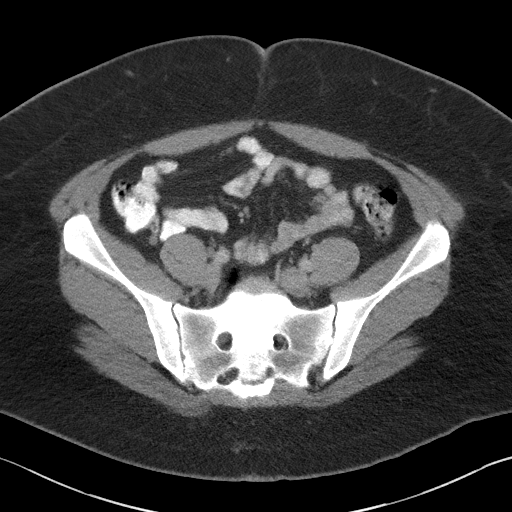
[im 40/95  soft-tissue]
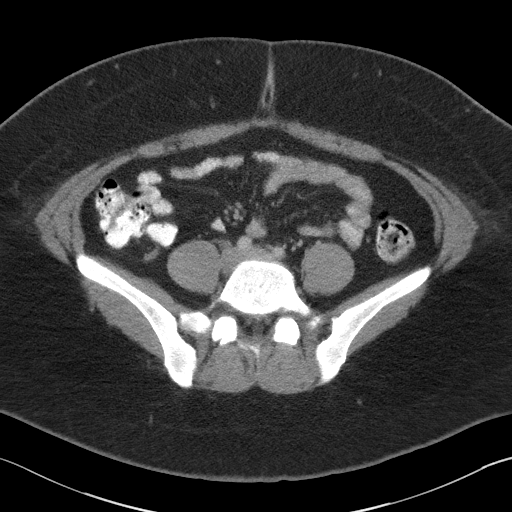
[im 49/95  soft-tissue]
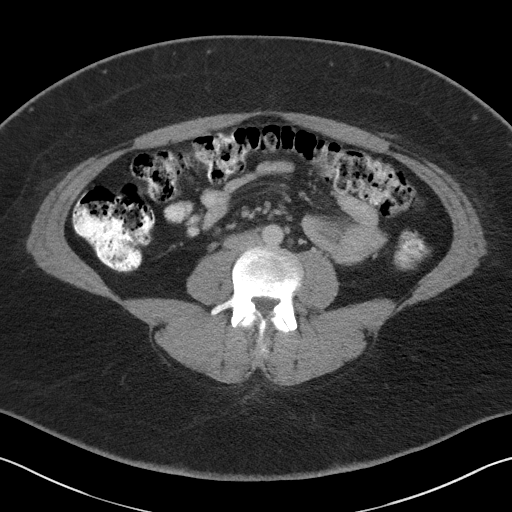
[im 55/95  soft-tissue]
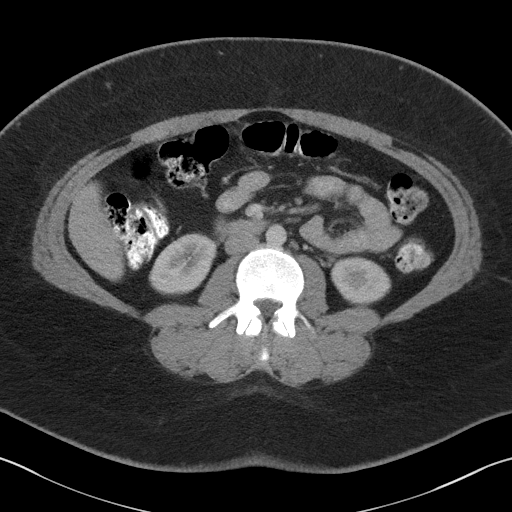
[im 61/95  soft-tissue]
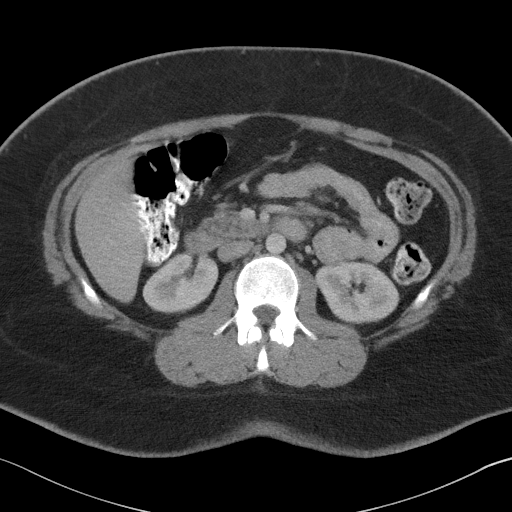
[im 61/95  bone]
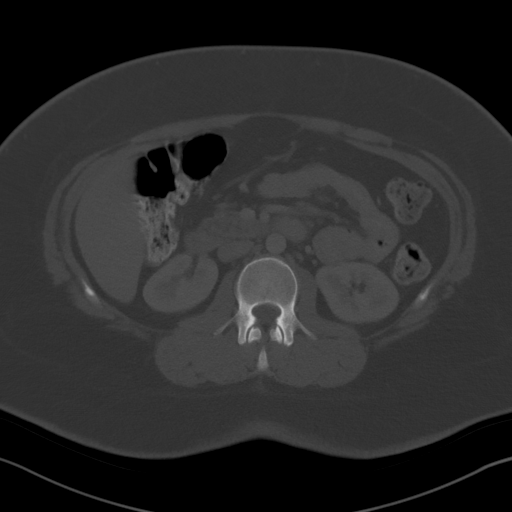
[im 67/95  soft-tissue]
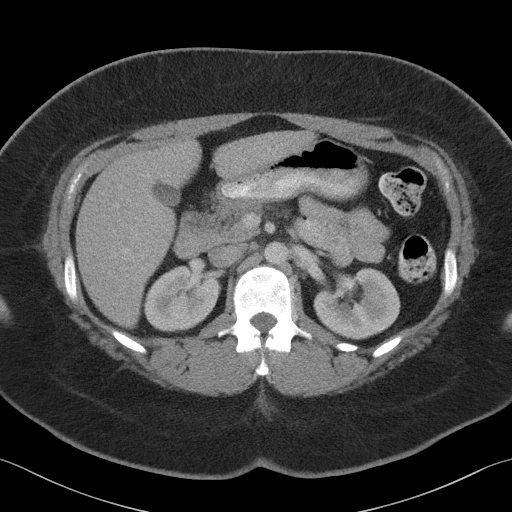
[im 76/95  soft-tissue]
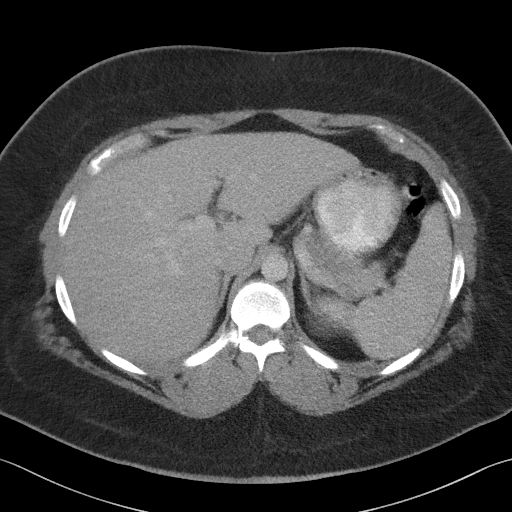
[im 82/95  soft-tissue]
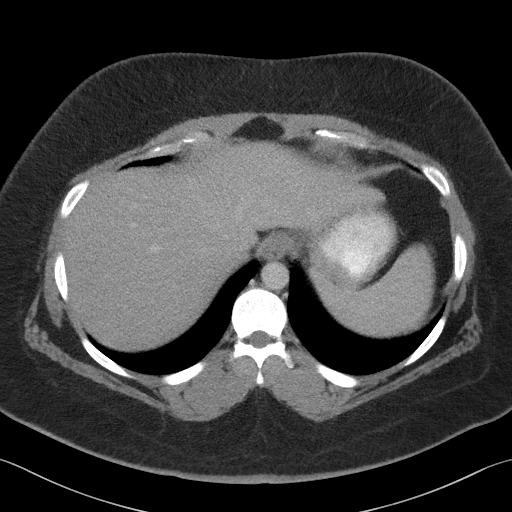
[im 88/95  soft-tissue]
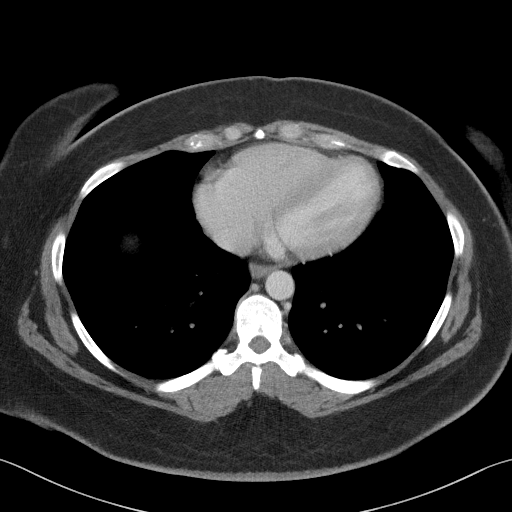

[Series 5: coronal st · coronal · 0.82mm/px · 3 of 114 slices shown]
[im 38/114  soft-tissue]
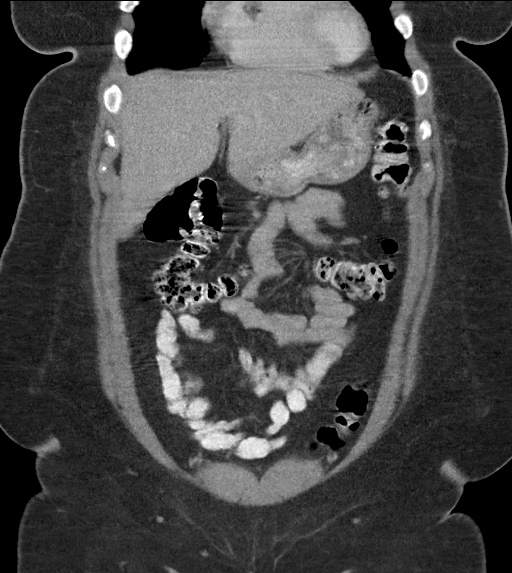
[im 51/114  soft-tissue]
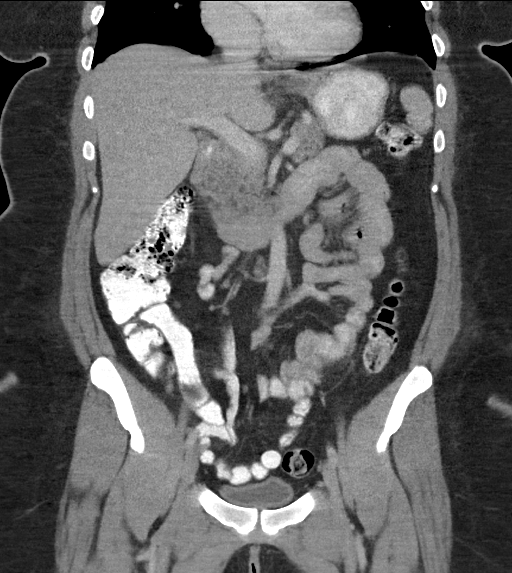
[im 63/114  soft-tissue]
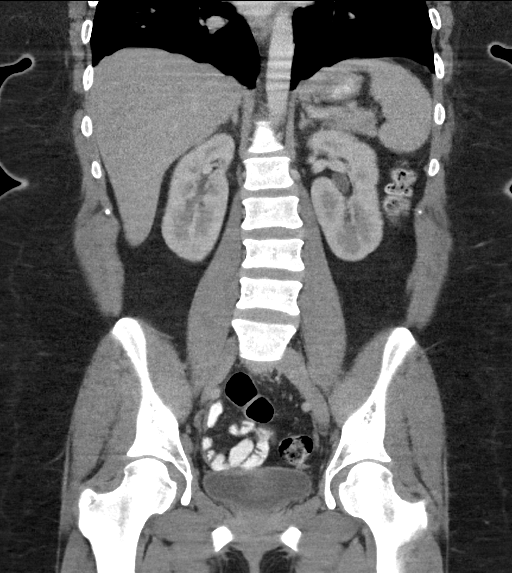

[16 of 46 positions shown; findings below may reference images not displayed]

FINDINGS: Lower chest: Lung bases are clear.

Hepatobiliary: No focal liver lesions are appreciable. Gallbladder
wall is not appreciably thickened. There is no biliary duct
dilatation.

Pancreas: There is no pancreatic mass or inflammatory focus.

Spleen: No splenic lesions are evident.

Adrenals/Urinary Tract: Adrenals bilaterally appear unremarkable.
Kidneys bilaterally show no evident mass or hydronephrosis on either
side. There is no evident renal or ureteral calculus on either side.
Urinary bladder is midline with wall thickness within normal limits.

Stomach/Bowel: There is moderate stool throughout colon. There is no
appreciable bowel wall or mesenteric thickening. There is no evident
bowel obstruction. There is no free air or portal venous air. The
terminal ileum appears normal.

Vascular/Lymphatic: There is no abdominal aortic aneurysm. No
vascular lesions are evident. There is a right inguinal lymph node
measuring 1.9 x 1.5 cm. There are scattered subcentimeter inguinal
lymph nodes bilaterally. No other lymph node prominence is evident
in the abdomen or pelvis.

Reproductive: Uterus is mildly retroverted. There is no evident
pelvic mass.

Other: The appendix appears normal. There is no abscess or ascites
in the abdomen or pelvis. There is a minimal ventral hernia
containing only fat.

Musculoskeletal: There are no blastic or lytic bone lesions. There
is no intramuscular or abdominal wall lesion. No soft tissue mass
evident in the right lower quadrant region.
IMPRESSION: 1. Single enlarged right inguinal lymph node. Etiology for this
finding uncertain. No other adenopathy.

2. No evident bowel obstruction. No abscess in the abdomen or
pelvis. Appendix appears normal. No soft tissue mass evident in the
right lower quadrant.

3.  No renal or ureteral calculi.  No hydronephrosis.

4.  Minimal ventral hernia containing only fat.

## 2020-05-20 ENCOUNTER — Other Ambulatory Visit: Payer: Medicaid Other | Admitting: Adult Health

## 2020-09-23 NOTE — Progress Notes (Unsigned)
This encounter was created in error - please disregard.

## 2020-10-13 ENCOUNTER — Other Ambulatory Visit: Payer: Self-pay

## 2020-10-13 MED ORDER — OMEPRAZOLE 20 MG PO CPDR: 20.0000 mg | DELAYED_RELEASE_CAPSULE | Freq: Every day | ORAL | 10 refills | Status: DC

## 2020-12-12 ENCOUNTER — Other Ambulatory Visit: Payer: Self-pay | Admitting: Obstetrics & Gynecology

## 2021-01-01 ENCOUNTER — Other Ambulatory Visit: Payer: Self-pay | Admitting: Women's Health

## 2021-01-17 NOTE — L&D Delivery Note (Signed)
[facility]N [facility] Delivery Note  Cheyenne Moore is a 31 y.o. G3P1011 s/p SVD at [redacted]w[redacted]d. She was admitted for IOL for cHTN and A2GDM.   ROM: 22h 55m with clear fluid GBS Status:  Negative/-- (11/17 1300) Maximum Maternal Temperature:  Temp (24hrs), Avg:98.4 F (36.9 C), Min:98 F (36.7 C), Max:98.9 F (37.2 C)    Labor Progress: Patient arrived at 2 cm dilation and was induced with cytotec, FB, AROM, Pitocin .   Delivery Date/Time: 12/26/2021 at 1350 Delivery: Called to room and patient was complete and pushing. Hands on hands delivery with FOB. Head delivered in LOA position. No nuchal cord present. Shoulder and body delivered in usual fashion. Infant with spontaneous cry, placed on mother's abdomen, dried and stimulated. Cord clamped x 2 after 1-minute delay, and cut by FOB. Cord blood drawn. Placenta delivered spontaneously with gentle cord traction. Fundus firm with massage and Pitocin. Labia, perineum, vagina, and cervix inspected with 1st degree perineal laceration .   Placenta: Sontaneous, intact, 3 vessel cord  Complications: None Lacerations: 1st degree perineal  EBL: 236 mL Analgesia: epidural    Infant: APGAR (1 MIN): 8  APGAR (5 MINS):  9 APGAR (10 MINS):    Weight: Pending  Derrel Nip, MD  12/26/2021 2:10 PM

## 2021-02-02 ENCOUNTER — Other Ambulatory Visit (HOSPITAL_COMMUNITY)
Admission: RE | Admit: 2021-02-02 | Discharge: 2021-02-02 | Disposition: A | Discharge status: Home / Self Care | Payer: Medicaid Other | Source: Ambulatory Visit | Attending: Women's Health | Admitting: Women's Health

## 2021-02-02 ENCOUNTER — Encounter: Payer: Self-pay | Admitting: Women's Health

## 2021-02-02 ENCOUNTER — Other Ambulatory Visit: Payer: Self-pay

## 2021-02-02 ENCOUNTER — Ambulatory Visit (INDEPENDENT_AMBULATORY_CARE_PROVIDER_SITE_OTHER): Payer: Medicaid Other | Admitting: Women's Health

## 2021-02-02 VITALS — BP 145/89 | HR 86 | Ht 66.0 in | Wt 300.0 lb

## 2021-02-02 DIAGNOSIS — Z0000 Encounter for general adult medical examination without abnormal findings: Secondary | ICD-10-CM | POA: Insufficient documentation

## 2021-02-02 DIAGNOSIS — I10 Essential (primary) hypertension: Secondary | ICD-10-CM

## 2021-02-02 DIAGNOSIS — Z01419 Encounter for gynecological examination (general) (routine) without abnormal findings: Secondary | ICD-10-CM

## 2021-02-02 NOTE — Patient Instructions (Signed)
Primary Care Providers Dr. Zack Hall ([place]) [phone] [facility] [phone] [facility] ([place]) [phone] [facility] ([place]) [phone] [facility] ([place]) [phone] [facility] ([place]) [phone] [facility] [place] [phone] [facility] [phone]  

## 2021-02-02 NOTE — Progress Notes (Signed)
WELL-WOMAN EXAMINATION Patient name: Cheyenne Moore MRN [id number]  Date of birth: [birth year] Chief Complaint:   Gynecologic Exam  History of Present Illness:   Cheyenne Moore is a 31 y.o. G49P1011 Caucasian female being seen today for a routine well-woman exam.  Current complaints: 2 periods back to back (end of Dec, then again 1/8). Denies abnormal discharge, itching/odor/irritation.   Still breastfeeding On norvasc 10mg  for [facility], took at 12pm today. Had a mild headache the other day she attributed to decreased caffeine, otherwise no headaches  PCP: none, was [facility], hasn't found anyone else since he passed does not desire labs Patient's last menstrual period was 01/24/2021. The current method of family planning is oral progesterone-only contraceptive.  Last pap 10/31/17. Results were: NILM w/ HRHPV negative. H/O abnormal pap: no Last mammogram: never. Results were: N/A. Family h/o breast cancer: yes PGA, PGGM Last colonoscopy: never. Results were: N/A. Family h/o colorectal cancer: yes MGM  Depression screen [facility] 2/9 02/02/2021 07/24/2019 04/11/2019 05/03/2018 10/31/2017  Decreased Interest 0 0 0 0 0  Down, Depressed, Hopeless 0 0 0 0 0  PHQ - 2 Score 0 0 0 0 0  Altered sleeping 0 0 0 - -  Tired, decreased energy 1 1 0 - -  Change in appetite 1 0 0 - -  Feeling bad or failure about yourself  0 0 0 - -  Trouble concentrating 0 0 0 - -  Moving slowly or fidgety/restless 0 0 0 - -  Suicidal thoughts 0 0 0 - -  PHQ-9 Score 2 1 0 - -     GAD 7 : Generalized Anxiety Score 02/02/2021 07/24/2019  Nervous, Anxious, on Edge 1 0  Control/stop worrying 0 0  Worry too much - different things 0 0  Trouble relaxing 0 0  Restless 0 0  Easily annoyed or irritable 0 0  Afraid - awful might happen 0 0  Total GAD 7 Score 1 0     Review of Systems:   Pertinent items are noted in HPI Denies any headaches, blurred vision, fatigue, shortness of breath, chest pain, abdominal pain, abnormal  vaginal discharge/itching/odor/irritation, problems with periods, bowel movements, urination, or intercourse unless otherwise stated above. Pertinent History Reviewed:  Reviewed past medical,surgical, social and family history.  Reviewed problem list, medications and allergies. Physical Assessment:   Vitals:   02/02/21 1450 02/02/21 1501 02/02/21 1515  BP: (!) 142/104 (!) 140/93 (!) 145/89  Pulse: 84 77 86  Weight: 300 lb (136.1 kg)    Height: 5\' 6"  (1.676 m)    Body mass index is 48.42 kg/m.        Physical Examination:   General appearance - well appearing, and in no distress  Mental status - alert, oriented to person, place, and time  Psych:  She has a normal mood and affect  Skin - warm and dry, normal color, no suspicious lesions noted  Chest - effort normal, all lung fields clear to auscultation bilaterally  Heart - normal rate and regular rhythm  Neck:  midline trachea, no thyromegaly or nodules  Breasts - breasts appear normal, no suspicious masses, no skin or nipple changes or  axillary nodes  Abdomen - soft, nontender, nondistended, no masses or organomegaly  Pelvic - VULVA: normal appearing vulva with no masses, tenderness or lesions  VAGINA: normal appearing vagina with normal color and discharge, no lesions  CERVIX: normal appearing cervix without discharge or lesions, no CMT  Thin prep pap is  done w/ reflex HR HPV cotesting  UTERUS: difficult to assess d/t habitus, no pain ADNEXA: difficult to assess d/t habitus, no pain   Extremities:  No swelling or varicosities noted  Chaperone: Malachy Mood    No results found for this or any previous visit (from the past 24 hour(s)).  Assessment & Plan:  1) Well-Woman Exam  2) CHTN> on norvasc 10mg  daily, took at 12pm, come back one day this week for bp check w/ nurse (make sure has taken norvasc at least 2hr prior to visit). Gave list of local PCPs to call and establish care w/ one  Labs/procedures today: pap  Mammogram: @  29yo, or sooner if problems Colonoscopy: @ 29yo, or sooner if problems  No orders of the defined types were placed in this encounter.   Meds: No orders of the defined types were placed in this encounter.   Follow-up: Return for one day this week for bp check w/ nurse; then 47yr for physical.  3yr CNM, [facility] 02/02/2021 3:18 PM

## 2021-02-08 LAB — CYTOLOGY - PAP
Comment: NEGATIVE
Diagnosis: NEGATIVE
High risk HPV: NEGATIVE

## 2021-02-10 ENCOUNTER — Encounter: Payer: Self-pay | Admitting: Women's Health

## 2021-02-10 ENCOUNTER — Other Ambulatory Visit: Payer: Self-pay

## 2021-02-10 ENCOUNTER — Ambulatory Visit (INDEPENDENT_AMBULATORY_CARE_PROVIDER_SITE_OTHER): Payer: Medicaid Other | Admitting: *Deleted

## 2021-02-10 DIAGNOSIS — I10 Essential (primary) hypertension: Secondary | ICD-10-CM | POA: Diagnosis not present

## 2021-02-10 NOTE — Progress Notes (Signed)
° °  NURSE VISIT- BLOOD PRESSURE CHECK  SUBJECTIVE:  Cheyenne Moore is a 31 y.o. G25P1011 female here for BP check. She is a GYN patient    HYPERTENSION ROS:    GYN patient: Taking medicines as instructed yes Headaches  No Chest pain No Shortness of breath No Swelling in legs/ankles No  OBJECTIVE:  BP (!) 138/92 (BP Location: Left Arm, Patient Position: Sitting, Cuff Size: Normal)    Pulse 75    LMP 01/24/2021    Breastfeeding Yes   Appearance alert, well appearing, and in no distress.  ASSESSMENT: GYN  blood pressure check  PLAN: Discussed with Cheyenne Moore, AGNP   Recommendations:  decrease sodium and sugar in diet    Follow-up: in 4 weeks for nurse visit bp check   Cheyenne Moore  02/10/2021 2:59 PM

## 2021-03-17 ENCOUNTER — Other Ambulatory Visit: Payer: Self-pay

## 2021-03-17 ENCOUNTER — Encounter: Payer: Self-pay | Admitting: *Deleted

## 2021-03-17 ENCOUNTER — Ambulatory Visit (INDEPENDENT_AMBULATORY_CARE_PROVIDER_SITE_OTHER): Payer: Medicaid Other | Admitting: *Deleted

## 2021-03-17 VITALS — BP 109/69 | HR 78 | Ht 66.0 in

## 2021-03-17 DIAGNOSIS — Z0130 Encounter for examination of blood pressure without abnormal findings: Secondary | ICD-10-CM

## 2021-03-17 NOTE — Progress Notes (Signed)
? ?  NURSE VISIT- BLOOD PRESSURE CHECK ? ?SUBJECTIVE:  ?Cheyenne Moore is a 31 y.o. G48P1011 female here for BP check. She is a GYN patient   ? ?HYPERTENSION ROS: ? ? ? ?GYN patient: ?Taking medicines as instructed yes ?Headaches  No ?Chest pain No ?Shortness of breath No ?Swelling in legs/ankles No ? ?OBJECTIVE:  ?BP 109/69 (BP Location: Left Arm, Patient Position: Sitting, Cuff Size: Large)   Pulse 78   Ht 5\' 6"  (1.676 m)   LMP 03/05/2021   Breastfeeding Yes   BMI 48.42 kg/m?   ?Appearance alert, well appearing, and in no distress. ? ?ASSESSMENT: ?GYN  blood pressure check ? ?PLAN: ?Discussed with 03/07/2021, CNM, WHNP   ?Recommendations: no changes needed   ?Follow-up: as scheduled  ? ?Joellyn Haff  ?03/17/2021 ?2:54 PM ? ?

## 2021-04-14 ENCOUNTER — Telehealth: Payer: Medicaid Other | Admitting: Physician Assistant

## 2021-04-14 DIAGNOSIS — L03115 Cellulitis of right lower limb: Secondary | ICD-10-CM

## 2021-04-14 MED ORDER — DOXYCYCLINE HYCLATE 100 MG PO TABS: 100.0000 mg | ORAL_TABLET | Freq: Two times a day (BID) | ORAL | 0 refills | Status: DC

## 2021-04-14 NOTE — Progress Notes (Signed)
?Virtual Visit Consent  ? ?Cheyenne Moore, you are scheduled for a virtual visit with a [facility] provider today.   ?  ?Just as with appointments in the office, your consent must be obtained to participate.  Your consent will be active for this visit and any virtual visit you may have with one of our providers in the next 365 days.   ?  ?If you have a MyChart account, a copy of this consent can be sent to you electronically.  All virtual visits are billed to your insurance company just like a traditional visit in the office.   ? ?As this is a virtual visit, video technology does not allow for your provider to perform a traditional examination.  This may limit your provider's ability to fully assess your condition.  If your provider identifies any concerns that need to be evaluated in person or the need to arrange testing (such as labs, EKG, etc.), we will make arrangements to do so.   ?  ?Although advances in technology are sophisticated, we cannot ensure that it will always work on either your end or our end.  If the connection with a video visit is poor, the visit may have to be switched to a telephone visit.  With either a video or telephone visit, we are not always able to ensure that we have a secure connection.    ? ?I need to obtain your verbal consent now.   Are you willing to proceed with your visit today?  ?  ?TATIYANNA LASHLEY has provided verbal consent on 04/14/2021 for a virtual visit (video or telephone). ?  ?Piedad Climes, PA-C  ? ?Date: 04/14/2021 1:34 PM ? ? ?Virtual Visit via Video Note  ? ?IPiedad Climes, connected with  ALANDRIA BUTKIEWICZ  ([id number], 1990-09-16) on 04/14/21 at  1:15 PM EDT by a video-enabled telemedicine application and verified that I am speaking with the correct person using two identifiers. ? ?Location: ?Patient: Virtual Visit Location Patient: Home ?Provider: Virtual Visit Location Provider: Home Office ?  ?I discussed the limitations of evaluation and  management by telemedicine and the availability of in person appointments. The patient expressed understanding and agreed to proceed.   ? ?History of Present Illness: ?Cheyenne Moore is a 31 y.o. who identifies as a female who was assigned female at birth, and is being seen today for redness and tenderness of R lower leg over the past few days. Initially noted a red bump that seemed "like a pimple without the head" on the leg. Denies any noted bite or trauma to leg. Tried to squeeze the bump without any drainage. Next day she noted redness of surrounding leg that has continued to increase. Denies fever, chills, malaise or fatigue. Stopped breastfeeding several weeks ago. No concerns for pregnancy. . ? ?HPI: HPI  ?Problems:  ?Patient Active Problem List  ? Diagnosis Date Noted  ? History of gestational diabetes 07/26/2019  ? Chronic hypertension 07/24/2019  ? Neck pain 02/07/2019  ? Hypothyroid 02/07/2019  ? Vitamin D deficiency 02/07/2019  ? Obesity 02/07/2019  ?  ?Allergies:  ?Allergies  ?Allergen Reactions  ? Red Dye Other (See Comments)  ?  GI upset  ? ?Medications:  ?Current Outpatient Medications:  ?  doxycycline (VIBRA-TABS) 100 MG tablet, Take 1 tablet (100 mg total) by mouth 2 (two) times daily., Disp: 14 tablet, Rfl: 0 ?  acetaminophen (TYLENOL) 325 MG tablet, Take 650 mg by mouth every 6 (six) hours  as needed for mild pain, fever or headache., Disp: , Rfl:  ?  amLODipine (NORVASC) 10 MG tablet, Take 1 tablet (10 mg total) by mouth daily., Disp: 30 tablet, Rfl: 11 ?  ARMOUR THYROID 90 MG tablet, TAKE 1 TABLET BY MOUTH DAILY BEFORE BREAKFAST, Disp: 30 tablet, Rfl: 11 ?  cholecalciferol (VITAMIN D) 1000 units tablet, Take 10,000 Units by mouth daily. , Disp: , Rfl:  ?  Loratadine (CLARITIN PO), Take by mouth., Disp: , Rfl:  ?  norethindrone (MICRONOR) 0.35 MG tablet, TAKE 1 TABLET(0.35 MG) BY MOUTH DAILY, Disp: 28 tablet, Rfl: 2 ?  omeprazole (PRILOSEC) 20 MG capsule, Take 1 capsule (20 mg total) by mouth  daily., Disp: 30 capsule, Rfl: 10 ?  Prenatal Vit-Fe Fumarate-FA (PRENATAL MULTIVITAMIN) TABS tablet, Take 1 tablet by mouth daily at 12 noon., Disp: , Rfl:  ? ?Observations/Objective: ?Patient is well-developed, well-nourished in no acute distress.  ?Resting comfortably at home.  ?Head is normocephalic, atraumatic.  ?No labored breathing. ?Speech is clear and coherent with logical content.  ?Patient is alert and oriented at baseline.  ?R lower leg with a 2 cm raised area without noted "head" and with a couple of inches of surrounding erythema. Skin is hardened per patient and tender. No drainage noted.  ? ?Assessment and Plan: ?1. Cellulitis of right lower extremity ?- doxycycline (VIBRA-TABS) 100 MG tablet; Take 1 tablet (100 mg total) by mouth 2 (two) times daily.  Dispense: 14 tablet; Refill: 0 ? ?Patient not pregnant. Is not breastfeeding. Start Doxycycline to cover for MRSA to be cautious. Supportive measures and OTC medications reviewed. She is to draw new line around the area of redness after being on antibiotics 24 hours. If extending past after that, she needs in-person evaluation.  ? ?Follow Up Instructions: ?I discussed the assessment and treatment plan with the patient. The patient was provided an opportunity to ask questions and all were answered. The patient agreed with the plan and demonstrated an understanding of the instructions.  A copy of instructions were sent to the patient via MyChart unless otherwise noted below.  ? ? ?The patient was advised to call back or seek an in-person evaluation if the symptoms worsen or if the condition fails to improve as anticipated. ? ?Time:  ?I spent 10 minutes with the patient via telehealth technology discussing the above problems/concerns.   ? ?Piedad Climes, PA-C ?

## 2021-04-14 NOTE — Patient Instructions (Signed)
?  Cheyenne Moore, thank you for joining Leeanne Rio, PA-C for today's virtual visit.  While this provider is not your primary care provider (PCP), if your PCP is located in our provider database this encounter information will be shared with them immediately following your visit. ? ?Consent: ?(Patient) Cheyenne Moore provided verbal consent for this virtual visit at the beginning of the encounter. ? ?Current Medications: ? ?Current Outpatient Medications:  ?  doxycycline (VIBRA-TABS) 100 MG tablet, Take 1 tablet (100 mg total) by mouth 2 (two) times daily., Disp: 14 tablet, Rfl: 0 ?  acetaminophen (TYLENOL) 325 MG tablet, Take 650 mg by mouth every 6 (six) hours as needed for mild pain, fever or headache., Disp: , Rfl:  ?  amLODipine (NORVASC) 10 MG tablet, Take 1 tablet (10 mg total) by mouth daily., Disp: 30 tablet, Rfl: 11 ?  ARMOUR THYROID 90 MG tablet, TAKE 1 TABLET BY MOUTH DAILY BEFORE BREAKFAST, Disp: 30 tablet, Rfl: 11 ?  cholecalciferol (VITAMIN D) 1000 units tablet, Take 10,000 Units by mouth daily. , Disp: , Rfl:  ?  Loratadine (CLARITIN PO), Take by mouth., Disp: , Rfl:  ?  norethindrone (MICRONOR) 0.35 MG tablet, TAKE 1 TABLET(0.35 MG) BY MOUTH DAILY, Disp: 28 tablet, Rfl: 2 ?  omeprazole (PRILOSEC) 20 MG capsule, Take 1 capsule (20 mg total) by mouth daily., Disp: 30 capsule, Rfl: 10 ?  Prenatal Vit-Fe Fumarate-FA (PRENATAL MULTIVITAMIN) TABS tablet, Take 1 tablet by mouth daily at 12 noon., Disp: , Rfl:   ? ?Medications ordered in this encounter:  ?Meds ordered this encounter  ?Medications  ? doxycycline (VIBRA-TABS) 100 MG tablet  ?  Sig: Take 1 tablet (100 mg total) by mouth 2 (two) times daily.  ?  Dispense:  14 tablet  ?  Refill:  0  ?  Order Specific Question:   Supervising Provider  ?  Answer:   Noemi Chapel [[id number]]  ?  ? ?*If you need refills on other medications prior to your next appointment, please contact your pharmacy* ? ?Follow-Up: ?Call back or seek an in-person evaluation  if the symptoms worsen or if the condition fails to improve as anticipated. ? ?Other Instructions ?Please take antibiotic as directed. ?Keep the skin clean and dry. ?Draw a new circle tomorrow as discussed. If redness passes this, or you note any fever, you need an in-person evaluation ASAP.  ? ? ?If you have been instructed to have an in-person evaluation today at a local Urgent Care facility, please use the link below. It will take you to a list of all of our available [facility], including address, phone number and hours of operation. Please do not delay care.  ?[facility] ? ?If you or a family member do not have a primary care provider, use the link below to schedule a visit and establish care. When you choose a [facility] primary care physician or advanced practice provider, you gain a long-term partner in health. ?Find a Primary Care Provider ? ?Learn more about 's in-office and virtual care options: ?[facility]  ?

## 2021-04-15 ENCOUNTER — Other Ambulatory Visit: Payer: Self-pay | Admitting: Women's Health

## 2021-05-11 ENCOUNTER — Ambulatory Visit (INDEPENDENT_AMBULATORY_CARE_PROVIDER_SITE_OTHER): Payer: Medicaid Other

## 2021-05-11 VITALS — BP 141/89 | HR 85 | Ht 66.0 in | Wt 299.0 lb

## 2021-05-11 DIAGNOSIS — Z3200 Encounter for pregnancy test, result unknown: Secondary | ICD-10-CM

## 2021-05-11 DIAGNOSIS — Z3201 Encounter for pregnancy test, result positive: Secondary | ICD-10-CM | POA: Diagnosis not present

## 2021-05-11 LAB — POCT URINE PREGNANCY: Preg Test, Ur: POSITIVE — AB

## 2021-05-11 NOTE — Progress Notes (Signed)
? ?  NURSE VISIT- PREGNANCY CONFIRMATION  ? ?SUBJECTIVE:  ?Cheyenne Moore is a 31 y.o. G9P1011 female at Unknown by uncertain LMP of No LMP recorded (lmp unknown). Patient is pregnant. Here for pregnancy confirmation.  Home pregnancy test: positive x 2   She reports cramping.  She is taking prenatal vitamins.   ? ?OBJECTIVE:  ?BP (!) 141/89 (BP Location: Right Arm, Patient Position: Sitting, Cuff Size: Normal)   Pulse 85   Ht 5\' 6"  (1.676 m)   Wt 299 lb (135.6 kg)   LMP  (LMP Unknown)   Breastfeeding No   BMI 48.26 kg/m?   ?Appears well, in no apparent distress ? ?Results for orders placed or performed in visit on 05/11/21 (from the past 24 hour(s))  ?POCT urine pregnancy  ? Collection Time: 05/11/21  3:21 PM  ?Result Value Ref Range  ? Preg Test, Ur Positive (A) Negative  ? ? ?ASSESSMENT: ?Positive pregnancy test, Unknown by LMP   ? ?PLAN: ?Schedule for dating ultrasound in 3 weeks ?Prenatal vitamins: continue   ?Nausea medicines: not currently needed   ?OB packet given: Yes ? ?Shailah Gibbins A Welma Mccombs  ?05/11/2021 ?3:26 PM  ?

## 2021-05-12 LAB — BETA HCG QUANT (REF LAB): hCG Quant: 15190 m[IU]/mL

## 2021-05-21 ENCOUNTER — Telehealth: Payer: Self-pay

## 2021-05-21 NOTE — Telephone Encounter (Signed)
Pt called, two identifiers used. Pt stated that she was having some intermittent cramping without any bleeding and wanted to make sure it was normal. Pt was reassured that some cramping was normal. She was instructed that if she had severe pain or bleeding heavy like a period, she should go to the hospital. Pt confirmed understanding. ?

## 2021-06-02 ENCOUNTER — Other Ambulatory Visit: Payer: Self-pay | Admitting: Obstetrics & Gynecology

## 2021-06-02 DIAGNOSIS — O3680X0 Pregnancy with inconclusive fetal viability, not applicable or unspecified: Secondary | ICD-10-CM

## 2021-06-03 ENCOUNTER — Ambulatory Visit (INDEPENDENT_AMBULATORY_CARE_PROVIDER_SITE_OTHER): Payer: Medicaid Other

## 2021-06-03 DIAGNOSIS — O3680X0 Pregnancy with inconclusive fetal viability, not applicable or unspecified: Secondary | ICD-10-CM | POA: Diagnosis not present

## 2021-06-03 DIAGNOSIS — Z3A09 9 weeks gestation of pregnancy: Secondary | ICD-10-CM

## 2021-06-03 NOTE — Progress Notes (Signed)
[place] 9+1 wks,single IUP with yolk sac,FHR 174 bpm,CRL 24.17 mm,right ovary not visualized,simple left corpus luteal cyst 2.8 x 2.7 x 2 cm

## 2021-06-30 ENCOUNTER — Other Ambulatory Visit: Payer: Self-pay | Admitting: Obstetrics & Gynecology

## 2021-06-30 DIAGNOSIS — Z3682 Encounter for antenatal screening for nuchal translucency: Secondary | ICD-10-CM

## 2021-07-01 ENCOUNTER — Encounter: Payer: Self-pay | Admitting: Advanced Practice Midwife

## 2021-07-01 ENCOUNTER — Other Ambulatory Visit: Payer: Medicaid Other

## 2021-07-01 ENCOUNTER — Ambulatory Visit (INDEPENDENT_AMBULATORY_CARE_PROVIDER_SITE_OTHER): Payer: Medicaid Other

## 2021-07-01 DIAGNOSIS — Z3682 Encounter for antenatal screening for nuchal translucency: Secondary | ICD-10-CM

## 2021-07-01 DIAGNOSIS — Z3A13 13 weeks gestation of pregnancy: Secondary | ICD-10-CM

## 2021-07-01 DIAGNOSIS — Z6841 Body Mass Index (BMI) 40.0 and over, adult: Secondary | ICD-10-CM

## 2021-07-01 DIAGNOSIS — O0990 Supervision of high risk pregnancy, unspecified, unspecified trimester: Secondary | ICD-10-CM | POA: Insufficient documentation

## 2021-07-01 DIAGNOSIS — O0992 Supervision of high risk pregnancy, unspecified, second trimester: Secondary | ICD-10-CM

## 2021-07-01 DIAGNOSIS — O0991 Supervision of high risk pregnancy, unspecified, first trimester: Secondary | ICD-10-CM

## 2021-07-01 DIAGNOSIS — I10 Essential (primary) hypertension: Secondary | ICD-10-CM

## 2021-07-01 NOTE — Progress Notes (Signed)
[place] 13+1 wks,measurements c/w dates,CRL 76.95 mm,FHR 152 bpm,simple left corpus luteal cyst 3.4 x 2.1 x 2.1 cm,normal right ovary,NB present,NT 2.3 mm,limited view because of retroverted uterus and pt body habitus

## 2021-07-02 ENCOUNTER — Ambulatory Visit (INDEPENDENT_AMBULATORY_CARE_PROVIDER_SITE_OTHER): Payer: Medicaid Other | Admitting: Advanced Practice Midwife

## 2021-07-02 ENCOUNTER — Encounter: Payer: Self-pay | Admitting: Advanced Practice Midwife

## 2021-07-02 ENCOUNTER — Ambulatory Visit: Payer: Medicaid Other | Admitting: *Deleted

## 2021-07-02 VITALS — BP 137/85 | HR 70 | Wt 295.6 lb

## 2021-07-02 DIAGNOSIS — I10 Essential (primary) hypertension: Secondary | ICD-10-CM

## 2021-07-02 DIAGNOSIS — O0991 Supervision of high risk pregnancy, unspecified, first trimester: Secondary | ICD-10-CM

## 2021-07-02 DIAGNOSIS — Z3A13 13 weeks gestation of pregnancy: Secondary | ICD-10-CM | POA: Diagnosis not present

## 2021-07-02 DIAGNOSIS — Z363 Encounter for antenatal screening for malformations: Secondary | ICD-10-CM | POA: Diagnosis not present

## 2021-07-02 DIAGNOSIS — E039 Hypothyroidism, unspecified: Secondary | ICD-10-CM

## 2021-07-02 DIAGNOSIS — Z8632 Personal history of gestational diabetes: Secondary | ICD-10-CM | POA: Diagnosis not present

## 2021-07-02 LAB — POCT URINALYSIS DIPSTICK OB
Blood, UA: NEGATIVE
Glucose, UA: NEGATIVE
Ketones, UA: NEGATIVE
Leukocytes, UA: NEGATIVE
Nitrite, UA: NEGATIVE
POC,PROTEIN,UA: NEGATIVE

## 2021-07-02 MED ORDER — ASPIRIN 81 MG PO CHEW: 162.0000 mg | CHEWABLE_TABLET | Freq: Every day | ORAL | 7 refills | Status: DC

## 2021-07-02 NOTE — Patient Instructions (Signed)
Cheyenne Moore, thank you for choosing our office today! We appreciate the opportunity to meet your healthcare needs. You may receive a short survey by mail, e-mail, or through MyChart. If you are happy with your care we would appreciate if you could take just a few minutes to complete the survey questions. We read all of your comments and take your feedback very seriously. Thank you again for choosing our office.  [facility] ([address] [place], [place] [address]) Entrance C, located off of [address] Free 24/7 valet parking   Nausea & Vomiting Have saltine crackers or pretzels by your bed and eat a few bites before you raise your head out of bed in the morning Eat small frequent meals throughout the day instead of large meals Drink plenty of fluids throughout the day to stay hydrated, just don't drink a lot of fluids with your meals.  This can make your stomach fill up faster making you feel sick Do not brush your teeth right after you eat Products with real ginger are good for nausea, like ginger ale and ginger hard candy Make sure it says made with real ginger! Sucking on sour candy like lemon heads is also good for nausea If your prenatal vitamins make you nauseated, take them at night so you will sleep through the nausea Sea Bands If you feel like you need medicine for the nausea & vomiting please let us know If you are unable to keep any fluids or food down please let us know   Constipation Drink plenty of fluid, preferably water, throughout the day Eat foods high in fiber such as fruits, vegetables, and grains Exercise, such as walking, is a good way to keep your bowels regular Drink warm fluids, especially warm prune juice, or decaf coffee Eat a 1/2 cup of real oatmeal (not instant), 1/2 cup applesauce, and 1/2-1 cup warm prune juice every day If needed, you may take Colace (docusate sodium) stool softener  once or twice a day to help keep the stool soft.  If you still are having problems with constipation, you may take Miralax once daily as needed to help keep your bowels regular.   Home Blood Pressure Monitoring for Patients   Your provider has recommended that you check your blood pressure (BP) at least once a week at home. If you do not have a blood pressure cuff at home, one will be provided for you. Contact your provider if you have not received your monitor within 1 week.   Helpful Tips for Accurate Home Blood Pressure Checks  Don't smoke, exercise, or drink caffeine 30 minutes before checking your BP Use the restroom before checking your BP (a full bladder can raise your pressure) Relax in a comfortable upright chair Feet on the ground Left arm resting comfortably on a flat surface at the level of your heart Legs uncrossed Back supported Sit quietly and don't talk Place the cuff on your bare arm Adjust snuggly, so that only two fingertips can fit between your skin and the top of the cuff Check 2 readings separated by at least one minute Keep a log of your BP readings For a visual, please reference this diagram: http://ccnc.care/bpdiagram  Provider Name: [facility]     Phone: [phone]  Zone 1: ALL CLEAR  Continue to monitor your symptoms:  BP reading is less than 140 (top number) or less than 90 (bottom   number)  No right upper stomach pain No headaches or seeing spots No feeling nauseated or throwing up No swelling in face and hands  Zone 2: CAUTION Call your doctor's office for any of the following:  BP reading is greater than 140 (top number) or greater than 90 (bottom number)  Stomach pain under your ribs in the middle or right side Headaches or seeing spots Feeling nauseated or throwing up Swelling in face and hands  Zone 3: EMERGENCY  Seek immediate medical care if you have any of the following:  BP reading is greater than160 (top number) or greater than  110 (bottom number) Severe headaches not improving with Tylenol Serious difficulty catching your breath Any worsening symptoms from Zone 2    First Trimester of Pregnancy The first trimester of pregnancy is from week 1 until the end of week 12 (months 1 through 3). A week after a sperm fertilizes an egg, the egg will implant on the wall of the uterus. This embryo will begin to develop into a baby. Genes from you and your partner are forming the baby. The female genes determine whether the baby is a boy or a girl. At 6-8 weeks, the eyes and face are formed, and the heartbeat can be seen on ultrasound. At the end of 12 weeks, all the baby's organs are formed.  Now that you are pregnant, you will want to do everything you can to have a healthy baby. Two of the most important things are to get good prenatal care and to follow your health care provider's instructions. Prenatal care is all the medical care you receive before the baby's birth. This care will help prevent, find, and treat any problems during the pregnancy and childbirth. BODY CHANGES Your body goes through many changes during pregnancy. The changes vary from woman to woman.  You may gain or lose a couple of pounds at first. You may feel sick to your stomach (nauseous) and throw up (vomit). If the vomiting is uncontrollable, call your health care provider. You may tire easily. You may develop headaches that can be relieved by medicines approved by your health care provider. You may urinate more often. Painful urination may mean you have a bladder infection. You may develop heartburn as a result of your pregnancy. You may develop constipation because certain hormones are causing the muscles that push waste through your intestines to slow down. You may develop hemorrhoids or swollen, bulging veins (varicose veins). Your breasts may begin to grow larger and become tender. Your nipples may stick out more, and the tissue that surrounds them  (areola) may become darker. Your gums may bleed and may be sensitive to brushing and flossing. Dark spots or blotches (chloasma, mask of pregnancy) may develop on your face. This will likely fade after the baby is born. Your menstrual periods will stop. You may have a loss of appetite. You may develop cravings for certain kinds of food. You may have changes in your emotions from day to day, such as being excited to be pregnant or being concerned that something may go wrong with the pregnancy and baby. You may have more vivid and strange dreams. You may have changes in your hair. These can include thickening of your hair, rapid growth, and changes in texture. Some women also have hair loss during or after pregnancy, or hair that feels dry or thin. Your hair will most likely return to normal after your baby is born. WHAT TO EXPECT AT YOUR PRENATAL   VISITS During a routine prenatal visit: You will be weighed to make sure you and the baby are growing normally. Your blood pressure will be taken. Your abdomen will be measured to track your baby's growth. The fetal heartbeat will be listened to starting around week 10 or 12 of your pregnancy. Test results from any previous visits will be discussed. Your health care provider may ask you: How you are feeling. If you are feeling the baby move. If you have had any abnormal symptoms, such as leaking fluid, bleeding, severe headaches, or abdominal cramping. If you have any questions. Other tests that may be performed during your first trimester include: Blood tests to find your blood type and to check for the presence of any previous infections. They will also be used to check for low iron levels (anemia) and Rh antibodies. Later in the pregnancy, blood tests for diabetes will be done along with other tests if problems develop. Urine tests to check for infections, diabetes, or protein in the urine. An ultrasound to confirm the proper growth and development  of the baby. An amniocentesis to check for possible genetic problems. Fetal screens for spina bifida and Down syndrome. You may need other tests to make sure you and the baby are doing well. HOME CARE INSTRUCTIONS  Medicines Follow your health care provider's instructions regarding medicine use. Specific medicines may be either safe or unsafe to take during pregnancy. Take your prenatal vitamins as directed. If you develop constipation, try taking a stool softener if your health care provider approves. Diet Eat regular, well-balanced meals. Choose a variety of foods, such as meat or vegetable-based protein, fish, milk and low-fat dairy products, vegetables, fruits, and whole grain breads and cereals. Your health care provider will help you determine the amount of weight gain that is right for you. Avoid raw meat and uncooked cheese. These carry germs that can cause birth defects in the baby. Eating four or five small meals rather than three large meals a day may help relieve nausea and vomiting. If you start to feel nauseous, eating a few soda crackers can be helpful. Drinking liquids between meals instead of during meals also seems to help nausea and vomiting. If you develop constipation, eat more high-fiber foods, such as fresh vegetables or fruit and whole grains. Drink enough fluids to keep your urine clear or pale yellow. Activity and Exercise Exercise only as directed by your health care provider. Exercising will help you: Control your weight. Stay in shape. Be prepared for labor and delivery. Experiencing pain or cramping in the lower abdomen or low back is a good sign that you should stop exercising. Check with your health care provider before continuing normal exercises. Try to avoid standing for long periods of time. Move your legs often if you must stand in one place for a long time. Avoid heavy lifting. Wear low-heeled shoes, and practice good posture. You may continue to have sex  unless your health care provider directs you otherwise. Relief of Pain or Discomfort Wear a good support bra for breast tenderness.   Take warm sitz baths to soothe any pain or discomfort caused by hemorrhoids. Use hemorrhoid cream if your health care provider approves.   Rest with your legs elevated if you have leg cramps or low back pain. If you develop varicose veins in your legs, wear support hose. Elevate your feet for 15 minutes, 3-4 times a day. Limit salt in your diet. Prenatal Care Schedule your prenatal visits by the   twelfth week of pregnancy. They are usually scheduled monthly at first, then more often in the last 2 months before delivery. Write down your questions. Take them to your prenatal visits. Keep all your prenatal visits as directed by your health care provider. Safety Wear your seat belt at all times when driving. Make a list of emergency phone numbers, including numbers for family, friends, the hospital, and police and fire departments. General Tips Ask your health care provider for a referral to a local prenatal education class. Begin classes no later than at the beginning of month 6 of your pregnancy. Ask for help if you have counseling or nutritional needs during pregnancy. Your health care provider can offer advice or refer you to specialists for help with various needs. Do not use hot tubs, steam rooms, or saunas. Do not douche or use tampons or scented sanitary pads. Do not cross your legs for long periods of time. Avoid cat litter boxes and soil used by cats. These carry germs that can cause birth defects in the baby and possibly loss of the fetus by miscarriage or stillbirth. Avoid all smoking, herbs, alcohol, and medicines not prescribed by your health care provider. Chemicals in these affect the formation and growth of the baby. Schedule a dentist appointment. At home, brush your teeth with a soft toothbrush and be gentle when you floss. SEEK MEDICAL CARE IF:   You have dizziness. You have mild pelvic cramps, pelvic pressure, or nagging pain in the abdominal area. You have persistent nausea, vomiting, or diarrhea. You have a bad smelling vaginal discharge. You have pain with urination. You notice increased swelling in your face, hands, legs, or ankles. SEEK IMMEDIATE MEDICAL CARE IF:  You have a fever. You are leaking fluid from your vagina. You have spotting or bleeding from your vagina. You have severe abdominal cramping or pain. You have rapid weight gain or loss. You vomit blood or material that looks like coffee grounds. You are exposed to German measles and have never had them. You are exposed to fifth disease or chickenpox. You develop a severe headache. You have shortness of breath. You have any kind of trauma, such as from a fall or a car accident. Document Released: 12/28/2000 Document Revised: 05/20/2013 Document Reviewed: 11/13/2012 ExitCare Patient Information 2015 [employer]. This information is not intended to replace advice given to you by your health care provider. Make sure you discuss any questions you have with your health care provider.  

## 2021-07-02 NOTE — Progress Notes (Signed)
INITIAL OBSTETRICAL VISIT Patient name: Cheyenne Moore MRN [id number]  Date of birth: [birth year] Chief Complaint:   Initial Prenatal Visit  History of Present Illness:   Cheyenne Moore is a 31 y.o. G52P1011 Caucasian female at 20w2dby [place] 9.1 weeks with an Estimated Date of Delivery: 01/05/22 being seen today for her initial obstetrical visit.   No LMP recorded (lmp unknown). Patient is pregnant. Her obstetrical history is significant for  SAB x 1 2020; SVB 2021 with GDMA2 and cHTN .   Today she reports nausea.  Last pap Jan 2023. Results were: NILM w/ HRHPV negative     07/02/2021    9:00 AM 02/02/2021    2:57 PM 07/24/2019    9:08 AM 04/11/2019    9:08 AM 05/03/2018   10:17 AM  Depression screen PHQ 2/9  Decreased Interest 0 0 0 0 0  Down, Depressed, Hopeless 0 0 0 0 0  PHQ - 2 Score 0 0 0 0 0  Altered sleeping 0 0 0 0   Tired, decreased energy _0 0   Change in appetite 0 1 0 0   Feeling bad or failure about yourself  0 0 0 0   Trouble concentrating 0 0 0 0   Moving slowly or fidgety/restless 0 0 0 0   Suicidal thoughts 0 0 0 0   PHQ-9 Score _1 0         07/02/2021    9:00 AM 02/02/2021    2:57 PM 07/24/2019    9:08 AM  GAD 7 : Generalized Anxiety Score  Nervous, Anxious, on Edge 2 1 0  Control/stop worrying 1 0 0  Worry too much - different things 1 0 0  Trouble relaxing 0 0 0  Restless 0 0 0  Easily annoyed or irritable 1 0 0  Afraid - awful might happen 1 0 0  Total GAD 7 Score 6 1 0     Review of Systems:   Pertinent items are noted in HPI Denies cramping/contractions, leakage of fluid, vaginal bleeding, abnormal vaginal discharge w/ itching/odor/irritation, headaches, visual changes, shortness of breath, chest pain, abdominal pain, severe nausea/vomiting, or problems with urination or bowel movements unless otherwise stated above.  Pertinent History Reviewed:  Reviewed past medical,surgical, social, obstetrical and family history.  Reviewed problem  list, medications and allergies. OB History  Gravida Para Term Preterm AB Living  _2 0 1 1  SAB IAB Ectopic Multiple Live Births  1 0 0 0 1    # Outcome Date GA Lbr Len/2nd Weight Sex Delivery Anes PTL Lv  3 Current           2 Term 10/21/19 364w3d2:38 / 02:08 7 lb 13 oz (3.544 kg) F Vag-Spont EPI N LIV     Birth Comments: WDL     Complications: Gestational diabetes  1 SAB 07/01/18 1358w2d         Birth Comments: 9.4 week sized fetus   Physical Assessment:   Vitals:   07/02/21 0856  BP: 137/85  Pulse: 70  There is no height or weight on file to calculate BMI.       Physical Examination:  General appearance - well appearing, and in no distress  Mental status - alert, oriented to person, place, and time  Psych:  She has a normal mood and affect  Skin - warm and dry, normal color, no suspicious lesions noted  Chest - effort normal, all lung fields clear to auscultation bilaterally  Heart - normal rate and regular rhythm  Abdomen - soft, nontender  Extremities:  No swelling or varicosities noted  Pelvic - not indicated  Thin prep pap is not done    07/01/21 NT [place] 13+1 wks,measurements c/w dates,CRL 76.95 mm,FHR 152 bpm,simple left corpus luteal cyst 3.4 x 2.1 x 2.1 cm,normal right ovary,NB present,NT 2.3 mm,limited view because of retroverted uterus and pt body habitus   Results for orders placed or performed in visit on 07/02/21 (from the past 24 hour(s))  POC Urinalysis Dipstick OB   Collection Time: 07/02/21  9:17 AM  Result Value Ref Range   Color, UA     Clarity, UA     Glucose, UA Negative Negative   Bilirubin, UA     Ketones, UA neg    Spec Grav, UA     Blood, UA neg    pH, UA     POC,PROTEIN,UA Negative Negative, Trace, Small (1+), Moderate (2+), Large (3+), 4+   Urobilinogen, UA     Nitrite, UA neg    Leukocytes, UA Negative Negative   Appearance     Odor    Results for orders placed or performed in visit on 07/01/21 (from the past 24 hour(s))   Integrated 1   Collection Time: 07/01/21 11:05 AM  Result Value Ref Range   Results WILL FOLLOW    Test Results: WILL FOLLOW    Submit Part 2 Sample Using WILL FOLLOW    Crown Rump Length WILL FOLLOW    Crown Rump Length Twin B WILL FOLLOW    CRL Scan WILL FOLLOW    CRL Scan Twin B WILL FOLLOW    Sonographer ID# WILL FOLLOW    Gest. Age on Collection Date WILL FOLLOW    Maternal Age at EDD WILL FOLLOW    Race WILL FOLLOW    Weight WILL FOLLOW    Number of Fetuses WILL FOLLOW    Nuchal Translucency (NT) WILL FOLLOW    NT Twin B WILL FOLLOW    Additional [place] WILL FOLLOW    PAPP-A Value WILL FOLLOW    Comments: WILL FOLLOW    Note: WILL FOLLOW   Comprehensive metabolic panel   Collection Time: 07/01/21 11:05 AM  Result Value Ref Range   Glucose 94 70 - 99 mg/dL   BUN 5 (L) 6 - 20 mg/dL   Creatinine, Ser 0.44 (L) 0.57 - 1.00 mg/dL   eGFR 134 >59 mL/min/1.73   BUN/Creatinine Ratio 11 9 - 23   Sodium 137 134 - 144 mmol/L   Potassium 4.2 3.5 - 5.2 mmol/L   Chloride 106 96 - 106 mmol/L   CO2 20 20 - 29 mmol/L   Calcium 9.7 8.7 - 10.2 mg/dL   Total Protein 6.5 6.0 - 8.5 g/dL   Albumin 4.1 3.9 - 5.0 g/dL   Globulin, Total 2.4 1.5 - 4.5 g/dL   Albumin/Globulin Ratio 1.7 1.2 - 2.2   Bilirubin Total 0.3 0.0 - 1.2 mg/dL   Alkaline Phosphatase 75 44 - 121 IU/L   AST 20 0 - 40 IU/L   ALT 20 0 - 32 IU/L  CBC/D/Plt+RPR+Rh+ABO+RubIgG...   Collection Time: 07/01/21 11:05 AM  Result Value Ref Range   Hepatitis B Surface Ag Negative Negative   HCV Ab Non Reactive Non Reactive   RPR Ser Ql WILL FOLLOW    Rubella Antibodies, IGG 1.71 Immune >0.99 index   ABO Grouping  O    Rh Factor Positive    Antibody Screen Negative Negative   HIV Screen 4th Generation wRfx Non Reactive Non Reactive   WBC 8.2 3.4 - 10.8 x10E3/uL   RBC 4.57 3.77 - 5.28 x10E6/uL   Hemoglobin 13.6 11.1 - 15.9 g/dL   Hematocrit 39.1 34.0 - 46.6 %   MCV 86 79 - 97 fL   MCH 29.8 26.6 - 33.0 pg   MCHC 34.8 31.5 - 35.7  g/dL   RDW 13.0 11.7 - 15.4 %   Platelets 261 150 - 450 x10E3/uL   Neutrophils 70 Not Estab. %   Lymphs 21 Not Estab. %   Monocytes 7 Not Estab. %   Eos 2 Not Estab. %   Basos 0 Not Estab. %   Neutrophils Absolute 5.7 1.4 - 7.0 x10E3/uL   Lymphocytes Absolute 1.7 0.7 - 3.1 x10E3/uL   Monocytes Absolute 0.6 0.1 - 0.9 x10E3/uL   EOS (ABSOLUTE) 0.1 0.0 - 0.4 x10E3/uL   Basophils Absolute 0.0 0.0 - 0.2 x10E3/uL   Immature Granulocytes 0 Not Estab. %   Immature Grans (Abs) 0.0 0.0 - 0.1 x10E3/uL  Hemoglobin A1c   Collection Time: 07/01/21 11:05 AM  Result Value Ref Range   Hgb A1c MFr Bld 5.3 4.8 - 5.6 %   Est. average glucose Bld gHb Est-mCnc 105 mg/dL  Interpretation:   Collection Time: 07/01/21 11:05 AM  Result Value Ref Range   HCV Interp 1: Comment     Assessment & Plan:  1) High-Risk Pregnancy G3P1011 at 63w2dwith an Estimated Date of Delivery: 01/05/22   2) Initial OB visit  3) cHTN on Norvasc 115m baseline labs collected, bASA 16273may, growth u/s q 4wks    2x/wk testing nst/sono @ 32wks     Deliver 38-39wks (37wks or prn if poor control)  4) Hx GDMA2, nl early HgbA1c (5.3)  5) Hypothyroid, taking Armour 31m76mSH today and q trimester  Meds:  Meds ordered this encounter  Medications   aspirin 81 MG chewable tablet    Sig: Chew 2 tablets (162 mg total) by mouth daily.    Dispense:  60 tablet    Refill:  7    Order Specific Question:   Supervising Provider    Answer:   EUREFlorian Buff10]    Initial labs obtained Continue prenatal vitamins Reviewed n/v relief measures and warning s/s to report Reviewed recommended weight gain based on pre-gravid BMI Encouraged well-balanced diet Genetic & carrier screening discussed: requests Panorama and NT/IT, declines Horizon (neg March 2021) Ultrasound discussed; fetal survey: requested CCNCFisherpleted> form faxed if has or is planning to apply for medicaid The nature [facility]  multiple MDs and other Advanced Practice Providers was explained to patient; also emphasized that fellows, residents, and students are part of our team. Does have home bp cuff. Office bp cuff given: no. Rx sent: no. Check bp weekly, let us [place] if consistently >140/90.   Indications for ASA therapy (per uptodate) One of the following: CHTN Yes   Follow-up: Return in about 4 weeks (around 07/30/2021) for 3-4wk HROB, 2nd IT; then 6-7wk HROB w anatomy u/s.   Orders Placed This Encounter  Procedures   Urine Culture   GC/Chlamydia Probe Amp   US [place] + 14 Wk   Protein / creatinine ratio, urine   Genetic Screening   POC Urinalysis Dipstick OB    [facility]rtis [facility]6/2023 9:43 AM

## 2021-07-03 LAB — PROTEIN / CREATININE RATIO, URINE
Creatinine, Urine: 61 mg/dL
Protein, Ur: 6.1 mg/dL
Protein/Creat Ratio: 100 mg/g creat (ref 0–200)

## 2021-07-03 LAB — SPECIMEN STATUS REPORT

## 2021-07-04 LAB — INTEGRATED 1
Crown Rump Length: 77 mm
Gest. Age on Collection Date: 13.4 weeks
Maternal Age at EDD: 29.8 yr
Nuchal Translucency (NT): 2.3 mm
Number of Fetuses: 1
PAPP-A Value: 365.7 ng/mL
Weight: 298 [lb_av]

## 2021-07-04 LAB — HEMOGLOBIN A1C
Est. average glucose Bld gHb Est-mCnc: 105 mg/dL
Hgb A1c MFr Bld: 5.3 % (ref 4.8–5.6)

## 2021-07-04 LAB — COMPREHENSIVE METABOLIC PANEL
ALT: 20 IU/L (ref 0–32)
AST: 20 IU/L (ref 0–40)
Albumin/Globulin Ratio: 1.7 (ref 1.2–2.2)
Albumin: 4.1 g/dL (ref 3.9–5.0)
Alkaline Phosphatase: 75 IU/L (ref 44–121)
BUN/Creatinine Ratio: 11 (ref 9–23)
BUN: 5 mg/dL — ABNORMAL LOW (ref 6–20)
Bilirubin Total: 0.3 mg/dL (ref 0.0–1.2)
CO2: 20 mmol/L (ref 20–29)
Calcium: 9.7 mg/dL (ref 8.7–10.2)
Chloride: 106 mmol/L (ref 96–106)
Creatinine, Ser: 0.44 mg/dL — ABNORMAL LOW (ref 0.57–1.00)
Globulin, Total: 2.4 g/dL (ref 1.5–4.5)
Glucose: 94 mg/dL (ref 70–99)
Potassium: 4.2 mmol/L (ref 3.5–5.2)
Sodium: 137 mmol/L (ref 134–144)
Total Protein: 6.5 g/dL (ref 6.0–8.5)
eGFR: 134 mL/min/{1.73_m2} (ref >59)

## 2021-07-04 LAB — CBC/D/PLT+RPR+RH+ABO+RUBIGG...
Antibody Screen: NEGATIVE
Basophils Absolute: 0 10*3/uL (ref 0.0–0.2)
Basos: 0 %
EOS (ABSOLUTE): 0.1 10*3/uL (ref 0.0–0.4)
Eos: 2 %
HCV Ab: NONREACTIVE
HIV Screen 4th Generation wRfx: NONREACTIVE
Hematocrit: 39.1 % (ref 34.0–46.6)
Hemoglobin: 13.6 g/dL (ref 11.1–15.9)
Hepatitis B Surface Ag: NEGATIVE
Immature Grans (Abs): 0 10*3/uL (ref 0.0–0.1)
Immature Granulocytes: 0 %
Lymphocytes Absolute: 1.7 10*3/uL (ref 0.7–3.1)
Lymphs: 21 %
MCH: 29.8 pg (ref 26.6–33.0)
MCHC: 34.8 g/dL (ref 31.5–35.7)
MCV: 86 fL (ref 79–97)
Monocytes Absolute: 0.6 10*3/uL (ref 0.1–0.9)
Monocytes: 7 %
Neutrophils Absolute: 5.7 10*3/uL (ref 1.4–7.0)
Neutrophils: 70 %
Platelets: 261 10*3/uL (ref 150–450)
RBC: 4.57 x10E6/uL (ref 3.77–5.28)
RDW: 13 % (ref 11.7–15.4)
RPR Ser Ql: NONREACTIVE
Rh Factor: POSITIVE
Rubella Antibodies, IGG: 1.71 index (ref >0.99)
WBC: 8.2 10*3/uL (ref 3.4–10.8)

## 2021-07-04 LAB — URINE CULTURE

## 2021-07-04 LAB — HCV INTERPRETATION

## 2021-07-05 LAB — SPECIMEN STATUS REPORT

## 2021-07-05 LAB — TSH: TSH: 1.39 u[IU]/mL (ref 0.450–4.500)

## 2021-07-06 LAB — GC/CHLAMYDIA PROBE AMP
Chlamydia trachomatis, NAA: NEGATIVE
Neisseria Gonorrhoeae by PCR: NEGATIVE

## 2021-07-23 ENCOUNTER — Ambulatory Visit (INDEPENDENT_AMBULATORY_CARE_PROVIDER_SITE_OTHER): Payer: Medicaid Other | Admitting: Obstetrics & Gynecology

## 2021-07-23 ENCOUNTER — Encounter: Payer: Self-pay | Admitting: Obstetrics & Gynecology

## 2021-07-23 VITALS — BP 131/88 | HR 89 | Wt 298.0 lb

## 2021-07-23 DIAGNOSIS — O0992 Supervision of high risk pregnancy, unspecified, second trimester: Secondary | ICD-10-CM

## 2021-07-23 DIAGNOSIS — E039 Hypothyroidism, unspecified: Secondary | ICD-10-CM

## 2021-07-23 DIAGNOSIS — I10 Essential (primary) hypertension: Secondary | ICD-10-CM

## 2021-07-23 DIAGNOSIS — Z3A16 16 weeks gestation of pregnancy: Secondary | ICD-10-CM

## 2021-07-23 LAB — POCT URINALYSIS DIPSTICK OB
Blood, UA: NEGATIVE
Glucose, UA: NEGATIVE
Ketones, UA: NEGATIVE
Leukocytes, UA: NEGATIVE
Nitrite, UA: NEGATIVE
POC,PROTEIN,UA: NEGATIVE

## 2021-07-23 NOTE — Progress Notes (Signed)
HIGH-RISK PREGNANCY VISIT Patient name: Cheyenne Moore MRN [id number]  Date of birth: 1990/11/14 Chief Complaint:   Routine Prenatal Visit (Noticed bright red blood this morning)  History of Present Illness:   Cheyenne Moore is a 31 y.o. G90P1011 female at [redacted]w[redacted]d with an Estimated Date of Delivery: 01/05/22 being seen today for ongoing management of a high-risk pregnancy complicated by chronic hypertension currently on norvasc 10.    Today she reports  had red spot when wiped this am, no sexno other no pain or cramping and no complaints. Contractions: Not present. Vag. Bleeding: None.  Movement: Present. denies leaking of fluid.      07/02/2021    9:00 AM 02/02/2021    2:57 PM 07/24/2019    9:08 AM 04/11/2019    9:08 AM 05/03/2018   10:17 AM  Depression screen PHQ 2/9  Decreased Interest 0 0 0 0 0  Down, Depressed, Hopeless 0 0 0 0 0  PHQ - 2 Score 0 0 0 0 0  Altered sleeping 0 0 0 0   Tired, decreased energy 2 1 1  0   Change in appetite 0 1 0 0   Feeling bad or failure about yourself  0 0 0 0   Trouble concentrating 0 0 0 0   Moving slowly or fidgety/restless 0 0 0 0   Suicidal thoughts 0 0 0 0   PHQ-9 Score 2 2 1  0         07/02/2021    9:00 AM 02/02/2021    2:57 PM 07/24/2019    9:08 AM  GAD 7 : Generalized Anxiety Score  Nervous, Anxious, on Edge 2 1 0  Control/stop worrying 1 0 0  Worry too much - different things 1 0 0  Trouble relaxing 0 0 0  Restless 0 0 0  Easily annoyed or irritable 1 0 0  Afraid - awful might happen 1 0 0  Total GAD 7 Score 6 1 0     Review of Systems:   Pertinent items are noted in HPI Denies abnormal vaginal discharge w/ itching/odor/irritation, headaches, visual changes, shortness of breath, chest pain, abdominal pain, severe nausea/vomiting, or problems with urination or bowel movements unless otherwise stated above. Pertinent History Reviewed:  Reviewed past medical,surgical, social, obstetrical and family history.  Reviewed problem  list, medications and allergies. Physical Assessment:   Vitals:   07/23/21 0910  BP: 131/88  Pulse: 89  Weight: 298 lb (135.2 kg)  Body mass index is 48.1 kg/m.           Physical Examination:   General appearance: alert, well appearing, and in no distress  Mental status: alert, oriented to person, place, and time  Skin: warm & dry   Extremities: Edema: None    Cardiovascular: normal heart rate noted  Respiratory: normal respiratory effort, no distress  Abdomen: gravid, soft, non-tender  Pelvic: Cervical exam deferred         Fetal Status: Fetal Heart Rate (bpm): 162   Movement: Present    Fetal Surveillance Testing today: FHR 162   Chaperone: N/A    Results for orders placed or performed in visit on 07/23/21 (from the past 24 hour(s))  POC Urinalysis Dipstick OB   Collection Time: 07/23/21  9:23 AM  Result Value Ref Range   Color, UA     Clarity, UA     Glucose, UA Negative Negative   Bilirubin, UA     Ketones, UA neg  Spec Grav, UA     Blood, UA neg    pH, UA     POC,PROTEIN,UA Negative Negative, Trace, Small (1+), Moderate (2+), Large (3+), 4+   Urobilinogen, UA     Nitrite, UA neg    Leukocytes, UA Negative Negative   Appearance     Odor      Assessment & Plan:  High-risk pregnancy: G3P1011 at [redacted]w[redacted]d with an Estimated Date of Delivery: 01/05/22      ICD-10-CM   1. Supervision of high risk pregnancy in second trimester  O09.92 INTEGRATED 2    POC Urinalysis Dipstick OB    US OB Comp + 14 Wk    2. [redacted] weeks gestation of pregnancy  Z3A.16 INTEGRATED 2    POC Urinalysis Dipstick OB    3. Chronic hypertension  I10 POC Urinalysis Dipstick OB   Norvasc 10    4. Hypothyroidism, unspecified type  E03.9    armour thyroid 90 mg        Meds: No orders of the defined types were placed in this encounter.   Orders:  Orders Placed This Encounter  Procedures   US OB Comp + 14 Wk   INTEGRATED 2   POC Urinalysis Dipstick OB     Labs/procedures today:  none  Treatment Plan:  sonogram 4 weeks  Reviewed: Preterm labor symptoms and general obstetric precautions including but not limited to vaginal bleeding, contractions, leaking of fluid and fetal movement were reviewed in detail with the patient.  All questions were answered. Does have home bp cuff. Office bp cuff given: yes. Check bp daily, let us know if consistently >150 and/or >95.  Follow-up: Return in about 4 weeks (around 08/20/2021) for 20 week sono, HROB.   Future Appointments  Date Time Provider Department Center  08/13/2021 10:30 AM [facility] [place] [facility] None  08/13/2021 11:30 AM Myna Hidalgo, DO [facility]    Orders Placed This Encounter  Procedures   US OB Comp + 14 Wk   INTEGRATED 2   POC Urinalysis Dipstick OB   Lazaro Arms  Attending Physician for the Center for [facility] 07/23/2021 9:46 AM

## 2021-07-26 LAB — INTEGRATED 2
AFP MoM: 1.7
Alpha-Fetoprotein: 31.4 ng/mL
Crown Rump Length: 77 mm
DIA MoM: 1.56
DIA Value: 163.6 pg/mL
Estriol, Unconjugated: 0.61 ng/mL
Gest. Age on Collection Date: 13.4 weeks
Gestational Age: 16.6 weeks
Maternal Age at EDD: 29.8 yr
Nuchal Translucency (NT): 2.3 mm
Nuchal Translucency MoM: 1.28
Number of Fetuses: 1
PAPP-A MoM: 0.63
PAPP-A Value: 365.7 ng/mL
Test Results:: NEGATIVE
Weight: 298 [lb_av]
Weight: 298 [lb_av]
hCG MoM: 2.38
hCG Value: 44.2 IU/mL
uE3 MoM: 0.75

## 2021-08-09 ENCOUNTER — Encounter: Payer: Self-pay | Admitting: Obstetrics & Gynecology

## 2021-08-12 ENCOUNTER — Other Ambulatory Visit: Payer: Self-pay | Admitting: Obstetrics & Gynecology

## 2021-08-12 DIAGNOSIS — O0992 Supervision of high risk pregnancy, unspecified, second trimester: Secondary | ICD-10-CM

## 2021-08-12 DIAGNOSIS — Z363 Encounter for antenatal screening for malformations: Secondary | ICD-10-CM

## 2021-08-13 ENCOUNTER — Ambulatory Visit (INDEPENDENT_AMBULATORY_CARE_PROVIDER_SITE_OTHER): Payer: Medicaid Other | Admitting: Obstetrics & Gynecology

## 2021-08-13 ENCOUNTER — Encounter: Payer: Self-pay | Admitting: Obstetrics & Gynecology

## 2021-08-13 ENCOUNTER — Ambulatory Visit (INDEPENDENT_AMBULATORY_CARE_PROVIDER_SITE_OTHER): Payer: Medicaid Other

## 2021-08-13 VITALS — BP 136/88 | HR 74 | Wt 295.4 lb

## 2021-08-13 DIAGNOSIS — I10 Essential (primary) hypertension: Secondary | ICD-10-CM

## 2021-08-13 DIAGNOSIS — O0992 Supervision of high risk pregnancy, unspecified, second trimester: Secondary | ICD-10-CM

## 2021-08-13 DIAGNOSIS — Z363 Encounter for antenatal screening for malformations: Secondary | ICD-10-CM | POA: Diagnosis not present

## 2021-08-13 DIAGNOSIS — Z3A19 19 weeks gestation of pregnancy: Secondary | ICD-10-CM | POA: Diagnosis not present

## 2021-08-13 LAB — POCT URINALYSIS DIPSTICK OB
Blood, UA: NEGATIVE
Glucose, UA: NEGATIVE
Ketones, UA: NEGATIVE
Leukocytes, UA: NEGATIVE
Nitrite, UA: NEGATIVE

## 2021-08-13 NOTE — Progress Notes (Signed)
[place] 19+2 wks,cephalic,posterior placenta gr 0,cx 2.9 cm,normal ovaries,SVP of fluid 6.9 cm,FHR 157 bpm,EFW 345 g 93%,anatomy complete

## 2021-08-13 NOTE — Progress Notes (Signed)
HIGH-RISK PREGNANCY VISIT Patient name: Cheyenne Moore MRN [id number]  Date of birth: 09-12-1990 Chief Complaint:   Routine Prenatal Visit (Anatomy scan)  History of Present Illness:   Cheyenne Moore is a 31 y.o. G27P1011 female at [redacted]w[redacted]d with an Estimated Date of Delivery: 01/05/22 being seen today for ongoing management of a high-risk pregnancy complicated by:    Chronic HTN- Amlodipine 10mg  dialy Mostly 130/80-90s  Hypothyroidism- on Armour 90mg   Today she reports no complaints.   Contractions: Not present. Vag. Bleeding: None.  Movement: Present. denies leaking of fluid.      07/02/2021    9:00 AM 02/02/2021    2:57 PM 07/24/2019    9:08 AM 04/11/2019    9:08 AM 05/03/2018   10:17 AM  Depression screen PHQ 2/9  Decreased Interest 0 0 0 0 0  Down, Depressed, Hopeless 0 0 0 0 0  PHQ - 2 Score 0 0 0 0 0  Altered sleeping 0 0 0 0   Tired, decreased energy 2 1 1  0   Change in appetite 0 1 0 0   Feeling bad or failure about yourself  0 0 0 0   Trouble concentrating 0 0 0 0   Moving slowly or fidgety/restless 0 0 0 0   Suicidal thoughts 0 0 0 0   PHQ-9 Score 2 2 1  0      Current Outpatient Medications  Medication Instructions   acetaminophen (TYLENOL) 650 mg, Oral, Every 6 hours PRN   amLODipine (NORVASC) 10 MG tablet TAKE 1 TABLET(10 MG) BY MOUTH DAILY   ARMOUR THYROID 90 MG tablet TAKE 1 TABLET BY MOUTH DAILY BEFORE BREAKFAST   aspirin 162 mg, Oral, Daily   cholecalciferol (VITAMIN D) 10,000 Units, Oral, Daily   dextromethorphan-guaiFENesin (MUCINEX DM) 30-600 MG 12hr tablet 1 tablet, Oral, 2 times daily   guaifenesin (ROBITUSSIN) 200 mg, Oral, 3 times daily PRN   Loratadine (CLARITIN PO) Oral   omeprazole (PRILOSEC) 20 mg, Oral, Daily   Prenatal Vit-Fe Fumarate-FA (PRENATAL MULTIVITAMIN) TABS tablet 1 tablet, Oral, Daily     Review of Systems:   Pertinent items are noted in HPI Denies abnormal vaginal discharge w/ itching/odor/irritation, headaches, visual  changes, shortness of breath, chest pain, abdominal pain, severe nausea/vomiting, or problems with urination or bowel movements unless otherwise stated above. Pertinent History Reviewed:  Reviewed past medical,surgical, social, obstetrical and family history.  Reviewed problem list, medications and allergies. Physical Assessment:   Vitals:   08/13/21 1139  BP: 136/88  Pulse: 74  Weight: 295 lb 6.4 oz (134 kg)  Body mass index is 47.68 kg/m.           Physical Examination:   General appearance: alert, well appearing, and in no distress  Mental status: normal mood, behavior, speech, dress, motor activity, and thought processes  Skin: warm & dry   Extremities: Edema: None    Cardiovascular: normal heart rate noted  Respiratory: normal respiratory effort, no distress  Abdomen: gravid, soft, non-tender  Pelvic: Cervical exam deferred         Fetal Status:     Movement: Present    Fetal Surveillance Testing today: 4/9 19+2 wks,cephalic,posterior placenta gr 0,cx 2.9 cm,normal ovaries,SVP of fluid 6.9 cm,FHR 157 bpm,EFW 345 g 93%,anatomy complete   Chaperone: N/A    Results for orders placed or performed in visit on 08/13/21 (from the past 24 hour(s))  POC Urinalysis Dipstick OB   Collection Time: 08/13/21 11:41 AM  Result Value  Ref Range   Color, UA     Clarity, UA     Glucose, UA Negative Negative   Bilirubin, UA     Ketones, UA neg    Spec Grav, UA     Blood, UA neg    pH, UA     POC,PROTEIN,UA Trace Negative, Trace, Small (1+), Moderate (2+), Large (3+), 4+   Urobilinogen, UA     Nitrite, UA neg    Leukocytes, UA Negative Negative   Appearance     Odor       Assessment & Plan:  High-risk pregnancy: G3P1011 at [redacted]w[redacted]d with an Estimated Date of Delivery: 01/05/22   1) Chronic HTN -BP 130/80s, []  may consider adding Labetalol at next visit -continue growth q 4wks  2) Hypothyroidism Last checked in June  Reviewed today's anatomy scan- normal findings  Meds: No  orders of the defined types were placed in this encounter.   Labs/procedures today: growth scan  Treatment Plan:  as outlined above  Reviewed: Preterm labor symptoms and general obstetric precautions including but not limited to vaginal bleeding, contractions, leaking of fluid and fetal movement were reviewed in detail with the patient.  All questions were answered. Pt has home bp cuff. Check bp weekly, let July know if >140/90.   Follow-up: No follow-ups on file.   No future appointments.  Orders Placed This Encounter  Procedures   POC Urinalysis Dipstick OB    [place], DO Attending Obstetrician & Gynecologist, [facility] for [facility]

## 2021-08-20 ENCOUNTER — Encounter: Payer: Medicaid Other | Admitting: Obstetrics & Gynecology

## 2021-08-20 ENCOUNTER — Other Ambulatory Visit: Payer: Medicaid Other

## 2021-09-02 ENCOUNTER — Encounter: Payer: Self-pay | Admitting: Emergency Medicine

## 2021-09-02 ENCOUNTER — Ambulatory Visit
Admission: EM | Admit: 2021-09-02 | Discharge: 2021-09-02 | Disposition: A | Discharge status: Home / Self Care | Payer: Medicaid Other | Attending: Family Medicine | Admitting: Family Medicine

## 2021-09-02 DIAGNOSIS — J0110 Acute frontal sinusitis, unspecified: Secondary | ICD-10-CM | POA: Diagnosis not present

## 2021-09-02 MED ORDER — AMOXICILLIN 875 MG PO TABS: 875.0000 mg | ORAL_TABLET | Freq: Two times a day (BID) | ORAL | 0 refills | Status: AC

## 2021-09-02 NOTE — ED Triage Notes (Signed)
Pt present with cough and congestion xs 1 month that comes and goes.

## 2021-09-02 NOTE — ED Provider Notes (Signed)
[facility]   [id number] 09/02/21 Arrival Time: 1112  ASSESSMENT & PLAN:  1. Acute non-recurrent frontal sinusitis    Given duration, will treat for sinusitis. Begin: Meds ordered this encounter  Medications   amoxicillin (AMOXIL) 875 MG tablet    Sig: Take 1 tablet (875 mg total) by mouth 2 (two) times daily for 10 days.    Dispense:  20 tablet    Refill:  0   Discussed typical duration of symptoms. OTC symptom care as needed. Ensure adequate fluid intake and rest.   Follow-up Information     [facility]   Specialty: Urgent Care Why: If worsening or failing to improve as anticipated. Contact information: [address], [address] [place] [address] [phone]                Reviewed expectations re: course of current medical issues. Questions answered. Outlined signs and symptoms indicating need for more acute intervention. Patient verbalized understanding. After Visit Summary given.   SUBJECTIVE: History from: patient.  Cheyenne Moore is a 31 y.o. female who presents with complaint of nasal congestion, post-nasal drainage, and frontal sinus pain. Onset gradual,  on/off for past month . Respiratory symptoms: none. Fever: absent. Overall normal PO intake without n/v. OTC treatment: various without relief; is pregnant. Seasonal allergies: yes. History of frequent sinus infections: no. No specific aggravating or alleviating factors reported. Social History   Tobacco Use  Smoking Status Never  Smokeless Tobacco Never    ROS: As per HPI.  OBJECTIVE:  Vitals:   09/02/21 1220  BP: 133/85  Pulse: 76  Resp: 18  Temp: 98 F (36.7 C)  TempSrc: Oral  SpO2: 98%     General appearance: alert; no distress HEENT: nasal congestion; clear runny nose; frontal tenderness to palpation; turbinates boggy Neck: supple without LAD; trachea midline Lungs: unlabored respirations, symmetrical air entry;  cough: mild; no respiratory distress Skin: warm and dry Psychological: alert and cooperative; normal mood and affect  Allergies  Allergen Reactions   Red Dye Other (See Comments)    GI upset    Past Medical History:  Diagnosis Date   Allergic rhinitis    GERD (gastroesophageal reflux disease)    Gestational diabetes    metformin   Hypertension    Hypothyroidism    Thyroid disease    hypothyroid   Family History  Problem Relation Age of Onset   Diabetes Mother    Hypertension Mother    Kidney failure Mother    Diabetes Father    Hypertension Father    Heart failure Father    Cancer Maternal Grandmother        kidney   Colon cancer Maternal Grandmother    Hypertension Paternal Grandmother    Heart attack Paternal Grandmother    Cancer Paternal Grandfather        lung   Social History   Socioeconomic History   Marital status: Married    Spouse name: Onalee Hua    Number of children: Not on file   Years of education: Not on file   Highest education level: Not on file  Occupational History   Not on file  Tobacco Use   Smoking status: Never   Smokeless tobacco: Never  Vaping Use   Vaping Use: Never used  Substance and Sexual Activity   Alcohol use: No   Drug use: No   Sexual activity: Yes    Birth control/protection: None  Other Topics Concern  Not on file  Social History Narrative   Not on file   Social Determinants of Health   Financial Resource Strain: Low Risk  (07/02/2021)   Overall Financial Resource Strain (CARDIA)    Difficulty of Paying Living Expenses: Not hard at all  Food Insecurity: No Food Insecurity (07/02/2021)   Hunger Vital Sign    Worried About Running Out of Food in the Last Year: Never true    Ran Out of Food in the Last Year: Never true  Transportation Needs: No Transportation Needs (07/02/2021)   PRAPARE - Administrator, Civil Service (Medical): No    Lack of Transportation (Non-Medical): No  Physical Activity:  Insufficiently Active (07/02/2021)   Exercise Vital Sign    Days of Exercise per Week: 1 day    Minutes of Exercise per Session: 30 min  Stress: No Stress Concern Present (07/02/2021)   [employer] of Occupational Health - Occupational Stress Questionnaire    Feeling of Stress : Not at all  Social Connections: Socially Integrated (07/02/2021)   Social Connection and Isolation Panel [NHANES]    Frequency of Communication with Friends and Family: More than three times a week    Frequency of Social Gatherings with Friends and Family: More than three times a week    Attends Religious Services: More than 4 times per year    Active Member of [employer] or Organizations: Yes    Attends Banker Meetings: More than 4 times per year    Marital Status: Married  Catering manager Violence: Not At Risk (07/02/2021)   Humiliation, Afraid, Rape, and Kick questionnaire    Fear of Current or Ex-Partner: No    Emotionally Abused: No    Physically Abused: No    Sexually Abused: No             Mardella Layman, MD 09/02/21 1251

## 2021-09-09 ENCOUNTER — Other Ambulatory Visit: Payer: Self-pay | Admitting: Obstetrics & Gynecology

## 2021-09-09 DIAGNOSIS — O10919 Unspecified pre-existing hypertension complicating pregnancy, unspecified trimester: Secondary | ICD-10-CM

## 2021-09-13 ENCOUNTER — Ambulatory Visit (INDEPENDENT_AMBULATORY_CARE_PROVIDER_SITE_OTHER): Payer: Medicaid Other | Admitting: Obstetrics & Gynecology

## 2021-09-13 ENCOUNTER — Ambulatory Visit (INDEPENDENT_AMBULATORY_CARE_PROVIDER_SITE_OTHER): Payer: Medicaid Other

## 2021-09-13 ENCOUNTER — Encounter: Payer: Self-pay | Admitting: Obstetrics & Gynecology

## 2021-09-13 VITALS — BP 136/87 | HR 87 | Wt 305.4 lb

## 2021-09-13 DIAGNOSIS — Z3A23 23 weeks gestation of pregnancy: Secondary | ICD-10-CM | POA: Diagnosis not present

## 2021-09-13 DIAGNOSIS — O10919 Unspecified pre-existing hypertension complicating pregnancy, unspecified trimester: Secondary | ICD-10-CM

## 2021-09-13 DIAGNOSIS — I10 Essential (primary) hypertension: Secondary | ICD-10-CM

## 2021-09-13 DIAGNOSIS — O0992 Supervision of high risk pregnancy, unspecified, second trimester: Secondary | ICD-10-CM

## 2021-09-13 NOTE — Progress Notes (Signed)
HIGH-RISK PREGNANCY VISIT Patient name: Cheyenne Moore MRN [id number]  Date of birth: 1990-04-13 Chief Complaint:   Routine Prenatal Visit  History of Present Illness:   Cheyenne Moore is a 31 y.o. G4P1011 female at [redacted]w[redacted]d with an Estimated Date of Delivery: 01/05/22 being seen today for ongoing management of a high-risk pregnancy complicated by:  -cHTN- mostly 130/80s -s/p COVID ~ 2wk ago -morbid obesity  Today she reports  feeling better now- recovering from COVID and recent sinus infection .   Contractions: Not present. Vag. Bleeding: None.  Movement: Present. denies leaking of fluid.      07/02/2021    9:00 AM 02/02/2021    2:57 PM 07/24/2019    9:08 AM 04/11/2019    9:08 AM 05/03/2018   10:17 AM  Depression screen PHQ 2/9  Decreased Interest 0 0 0 0 0  Down, Depressed, Hopeless 0 0 0 0 0  PHQ - 2 Score 0 0 0 0 0  Altered sleeping 0 0 0 0   Tired, decreased energy 2 1 1  0   Change in appetite 0 1 0 0   Feeling bad or failure about yourself  0 0 0 0   Trouble concentrating 0 0 0 0   Moving slowly or fidgety/restless 0 0 0 0   Suicidal thoughts 0 0 0 0   PHQ-9 Score 2 2 1  0      Current Outpatient Medications  Medication Instructions   acetaminophen (TYLENOL) 650 mg, Oral, Every 6 hours PRN   amLODipine (NORVASC) 10 MG tablet TAKE 1 TABLET(10 MG) BY MOUTH DAILY   ARMOUR THYROID 90 MG tablet TAKE 1 TABLET BY MOUTH DAILY BEFORE BREAKFAST   aspirin 162 mg, Oral, Daily   cholecalciferol (VITAMIN D) 10,000 Units, Oral, Daily   dextromethorphan-guaiFENesin (MUCINEX DM) 30-600 MG 12hr tablet 1 tablet, Oral, 2 times daily   guaifenesin (ROBITUSSIN) 200 mg, Oral, 3 times daily PRN   Loratadine (CLARITIN PO) Oral   omeprazole (PRILOSEC) 20 mg, Oral, Daily   Prenatal Vit-Fe Fumarate-FA (PRENATAL MULTIVITAMIN) TABS tablet 1 tablet, Oral, Daily     Review of Systems:   Pertinent items are noted in HPI Denies abnormal vaginal discharge w/ itching/odor/irritation, headaches,  visual changes, shortness of breath, chest pain, abdominal pain, severe nausea/vomiting, or problems with urination or bowel movements unless otherwise stated above. Pertinent History Reviewed:  Reviewed past medical,surgical, social, obstetrical and family history.  Reviewed problem list, medications and allergies. Physical Assessment:   Vitals:   09/13/21 1418  BP: 136/87  Pulse: 87  Weight: (!) 305 lb 6.4 oz (138.5 kg)  Body mass index is 49.29 kg/m.           Physical Examination:   General appearance: alert, well appearing, and in no distress  Mental status: normal mood, behavior, speech, dress, motor activity, and thought processes  Skin: warm & dry   Extremities: Edema: None    Cardiovascular: normal heart rate noted  Respiratory: normal respiratory effort, no distress  Abdomen: gravid, soft, non-tender  Pelvic: Cervical exam deferred         Fetal Status:     Movement: Present    Fetal Surveillance Testing today: ,breech,fundal placenta gr 0,cx 3.6 cm,svp 5.1 cm,FHR 150 bpm,EFW 788 g    Chaperone: N/A    No results found for this or any previous visit (from the past 24 hour(s)).   Assessment & Plan:  High-risk pregnancy: G3P1011 at [redacted]w[redacted]d with an Estimated Date of Delivery:  01/05/22   1) Chronic HTN -continue growth q 4wks -continue with current medication  2) Hypothyroidism -check TSH q trimester  Meds: No orders of the defined types were placed in this encounter.   Labs/procedures today: PN2 next visit, growth scan today  Treatment Plan:  as outlined above  Reviewed: Preterm labor symptoms and general obstetric precautions including but not limited to vaginal bleeding, contractions, leaking of fluid and fetal movement were reviewed in detail with the patient.  All questions were answered. Pt has home bp cuff. Check bp weekly, let us know if >140/90.   Follow-up: Return in about 4 weeks (around 10/11/2021) for HROB visit, PN-2/TSH and growth scan (every  4wks).   Future Appointments  Date Time Provider Department Center  10/15/2021  8:50 AM [facility]  10/15/2021 11:30 AM [facility] [place] [facility] None  10/15/2021 12:30 PM Lazaro Arms, MD [facility]  11/12/2021 10:45 AM [facility] [place] [facility] None  11/12/2021 11:30 AM Myna Hidalgo, DO [facility]    No orders of the defined types were placed in this encounter.   Myna Hidalgo, DO Attending Obstetrician & Gynecologist, [facility] for [employer], [facility]

## 2021-09-13 NOTE — Progress Notes (Addendum)
[place] 23+5 wks,breech,fundal placenta gr 0,cx 3.6 cm,svp 5.1 cm,FHR 150 bpm,EFW 788 g 96%

## 2021-10-12 ENCOUNTER — Other Ambulatory Visit: Payer: Self-pay | Admitting: Obstetrics & Gynecology

## 2021-10-12 DIAGNOSIS — O10919 Unspecified pre-existing hypertension complicating pregnancy, unspecified trimester: Secondary | ICD-10-CM

## 2021-10-15 ENCOUNTER — Other Ambulatory Visit: Payer: Medicaid Other

## 2021-10-15 ENCOUNTER — Ambulatory Visit (INDEPENDENT_AMBULATORY_CARE_PROVIDER_SITE_OTHER): Payer: Medicaid Other

## 2021-10-15 ENCOUNTER — Ambulatory Visit (INDEPENDENT_AMBULATORY_CARE_PROVIDER_SITE_OTHER): Payer: Medicaid Other | Admitting: Obstetrics & Gynecology

## 2021-10-15 VITALS — BP 143/84 | HR 99 | Wt 313.0 lb

## 2021-10-15 DIAGNOSIS — O0990 Supervision of high risk pregnancy, unspecified, unspecified trimester: Secondary | ICD-10-CM

## 2021-10-15 DIAGNOSIS — O0993 Supervision of high risk pregnancy, unspecified, third trimester: Secondary | ICD-10-CM | POA: Diagnosis not present

## 2021-10-15 DIAGNOSIS — Z3A28 28 weeks gestation of pregnancy: Secondary | ICD-10-CM

## 2021-10-15 DIAGNOSIS — Z131 Encounter for screening for diabetes mellitus: Secondary | ICD-10-CM

## 2021-10-15 DIAGNOSIS — E039 Hypothyroidism, unspecified: Secondary | ICD-10-CM

## 2021-10-15 DIAGNOSIS — I10 Essential (primary) hypertension: Secondary | ICD-10-CM

## 2021-10-15 DIAGNOSIS — O10919 Unspecified pre-existing hypertension complicating pregnancy, unspecified trimester: Secondary | ICD-10-CM | POA: Diagnosis not present

## 2021-10-15 LAB — POCT URINALYSIS DIPSTICK OB
Blood, UA: NEGATIVE
Ketones, UA: NEGATIVE
Leukocytes, UA: NEGATIVE
Nitrite, UA: NEGATIVE
POC,PROTEIN,UA: NEGATIVE

## 2021-10-15 NOTE — Progress Notes (Signed)
[place] 28+2 wks,breech,cx 3.6 cm,FHR 166 bpm,posterior placenta gr 0,AFI 15.2 cm,EFW 1727 g 99.8%,AC 99%

## 2021-10-15 NOTE — Progress Notes (Signed)
HIGH-RISK PREGNANCY VISIT Patient name: Cheyenne Moore MRN [id number]  Date of birth: 1990/09/23 Chief Complaint:   Routine Prenatal Visit  History of Present Illness:   Cheyenne Moore is a 31 y.o. G66P1011 female at [redacted]w[redacted]d with an Estimated Date of Delivery: 01/05/22 being seen today for ongoing management of a high-risk pregnancy complicated by chronic hypertension currently on norvasc 10 mg.    Today she reports no complaints. Contractions: Not present. Vag. Bleeding: None.  Movement: Present. denies leaking of fluid.      07/02/2021    9:00 AM 02/02/2021    2:57 PM 07/24/2019    9:08 AM 04/11/2019    9:08 AM 05/03/2018   10:17 AM  Depression screen PHQ 2/9  Decreased Interest 0 0 0 0 0  Down, Depressed, Hopeless 0 0 0 0 0  PHQ - 2 Score 0 0 0 0 0  Altered sleeping 0 0 0 0   Tired, decreased energy 2 1 1  0   Change in appetite 0 1 0 0   Feeling bad or failure about yourself  0 0 0 0   Trouble concentrating 0 0 0 0   Moving slowly or fidgety/restless 0 0 0 0   Suicidal thoughts 0 0 0 0   PHQ-9 Score 2 2 1  0         07/02/2021    9:00 AM 02/02/2021    2:57 PM 07/24/2019    9:08 AM  GAD 7 : Generalized Anxiety Score  Nervous, Anxious, on Edge 2 1 0  Control/stop worrying 1 0 0  Worry too much - different things 1 0 0  Trouble relaxing 0 0 0  Restless 0 0 0  Easily annoyed or irritable 1 0 0  Afraid - awful might happen 1 0 0  Total GAD 7 Score 6 1 0     Review of Systems:   Pertinent items are noted in HPI Denies abnormal vaginal discharge w/ itching/odor/irritation, headaches, visual changes, shortness of breath, chest pain, abdominal pain, severe nausea/vomiting, or problems with urination or bowel movements unless otherwise stated above. Pertinent History Reviewed:  Reviewed past medical,surgical, social, obstetrical and family history.  Reviewed problem list, medications and allergies. Physical Assessment:   Vitals:   10/15/21 1215 10/15/21 1235  BP: (!)  150/89 (!) 143/84  Pulse: 99   Weight: (!) 313 lb (142 kg)   Body mass index is 50.52 kg/m.           Physical Examination:   General appearance: alert, well appearing, and in no distress  Mental status: alert, oriented to person, place, and time  Skin: warm & dry   Extremities: Edema: None    Cardiovascular: normal heart rate noted  Respiratory: normal respiratory effort, no distress  Abdomen: gravid, soft, non-tender  Pelvic: Cervical exam deferred         Fetal Status:     Movement: Present    Fetal Surveillance Testing today: sonogram   Chaperone: N/A    Results for orders placed or performed in visit on 10/15/21 (from the past 24 hour(s))  POC Urinalysis Dipstick OB   Collection Time: 10/15/21 12:21 PM  Result Value Ref Range   Color, UA     Clarity, UA     Glucose, UA Small (1+) (A) Negative   Bilirubin, UA     Ketones, UA neg    Spec Grav, UA     Blood, UA neg    pH, UA  POC,PROTEIN,UA Negative Negative, Trace, Small (1+), Moderate (2+), Large (3+), 4+   Urobilinogen, UA     Nitrite, UA neg    Leukocytes, UA Negative Negative   Appearance     Odor      Assessment & Plan:  High-risk pregnancy: G3P1011 at [redacted]w[redacted]d with an Estimated Date of Delivery: 01/05/22      ICD-10-CM   1. Supervision of high risk pregnancy, antepartum  O09.90 POC Urinalysis Dipstick OB    2. Chronic hypertension  I10    norvasc 10 mg    3. [redacted] weeks gestation of pregnancy  Z3A.28 POC Urinalysis Dipstick OB        Meds: No orders of the defined types were placed in this encounter.   Orders:  Orders Placed This Encounter  Procedures   POC Urinalysis Dipstick OB     Labs/procedures today: U/S  Treatment Plan:  routine CHTN care. Twice weekly at 32 weeks    Follow-up: Return in about 3 weeks (around 11/05/2021) for [place].   Future Appointments  Date Time Provider [place]  11/12/2021 10:45 AM [facility] [place] [facility] None  11/12/2021 11:30 AM Janyth Pupa,  DO [facility]    Orders Placed This Encounter  Procedures   POC Urinalysis Dipstick OB   Florian Buff  Attending Physician for the Center for [facility] 10/15/2021 12:47 PM

## 2021-10-16 LAB — CBC
Hematocrit: 35.7 % (ref 34.0–46.6)
Hemoglobin: 11.9 g/dL (ref 11.1–15.9)
MCH: 29.4 pg (ref 26.6–33.0)
MCHC: 33.3 g/dL (ref 31.5–35.7)
MCV: 88 fL (ref 79–97)
Platelets: 257 10*3/uL (ref 150–450)
RBC: 4.05 x10E6/uL (ref 3.77–5.28)
RDW: 12.8 % (ref 11.7–15.4)
WBC: 11.7 10*3/uL — ABNORMAL HIGH (ref 3.4–10.8)

## 2021-10-16 LAB — ANTIBODY SCREEN: Antibody Screen: NEGATIVE

## 2021-10-16 LAB — RPR: RPR Ser Ql: NONREACTIVE

## 2021-10-16 LAB — TSH: TSH: 2.39 u[IU]/mL (ref 0.450–4.500)

## 2021-10-16 LAB — GLUCOSE TOLERANCE, 2 HOURS W/ 1HR
Glucose, 1 hour: 213 mg/dL — ABNORMAL HIGH (ref 70–179)
Glucose, 2 hour: 174 mg/dL — ABNORMAL HIGH (ref 70–152)
Glucose, Fasting: 104 mg/dL — ABNORMAL HIGH (ref 70–91)

## 2021-10-16 LAB — HIV ANTIBODY (ROUTINE TESTING W REFLEX): HIV Screen 4th Generation wRfx: NONREACTIVE

## 2021-10-18 ENCOUNTER — Telehealth: Payer: Self-pay | Admitting: *Deleted

## 2021-10-18 ENCOUNTER — Other Ambulatory Visit: Payer: Self-pay | Admitting: Obstetrics & Gynecology

## 2021-10-18 ENCOUNTER — Encounter: Payer: Self-pay | Admitting: *Deleted

## 2021-10-18 DIAGNOSIS — O24419 Gestational diabetes mellitus in pregnancy, unspecified control: Secondary | ICD-10-CM | POA: Insufficient documentation

## 2021-10-18 DIAGNOSIS — O24415 Gestational diabetes mellitus in pregnancy, controlled by oral hypoglycemic drugs: Secondary | ICD-10-CM

## 2021-10-18 MED ORDER — METFORMIN HCL 500 MG PO TABS: 500.0000 mg | ORAL_TABLET | Freq: Two times a day (BID) | ORAL | 2 refills | Status: DC

## 2021-10-18 MED ORDER — ACCU-CHEK SOFTCLIX LANCETS MISC: 12 refills | Status: DC

## 2021-10-18 MED ORDER — ACCU-CHEK GUIDE ME W/DEVICE KIT: 1.0000 | PACK | Freq: Four times a day (QID) | 0 refills | Status: DC

## 2021-10-18 MED ORDER — ACCU-CHEK GUIDE VI STRP: ORAL_STRIP | 12 refills | Status: DC

## 2021-10-18 NOTE — Telephone Encounter (Signed)
Called patient to inform her that she did not pass glucola.  Dr Elonda Husky is going to start her on Metformin and has already sent in the prescription to her pharmacy.  Will send in testing supplies and will need to check her blood sugar 4 times daily and bring log to all appointments.  Pt verbalized understanding with no further questions.

## 2021-10-22 ENCOUNTER — Other Ambulatory Visit: Payer: Self-pay | Admitting: *Deleted

## 2021-10-22 DIAGNOSIS — O24415 Gestational diabetes mellitus in pregnancy, controlled by oral hypoglycemic drugs: Secondary | ICD-10-CM

## 2021-10-22 MED ORDER — ACCU-CHEK GUIDE ME W/DEVICE KIT: 1.0000 | PACK | Freq: Four times a day (QID) | 0 refills | Status: DC

## 2021-10-22 MED ORDER — ACCU-CHEK GUIDE VI STRP: ORAL_STRIP | 12 refills | Status: DC

## 2021-10-22 MED ORDER — ACCU-CHEK SOFTCLIX LANCETS MISC: 12 refills | Status: DC

## 2021-10-22 MED ORDER — METFORMIN HCL 500 MG PO TABS: 500.0000 mg | ORAL_TABLET | Freq: Two times a day (BID) | ORAL | 2 refills | Status: DC

## 2021-10-24 ENCOUNTER — Other Ambulatory Visit: Payer: Self-pay | Admitting: Adult Health

## 2021-11-01 ENCOUNTER — Encounter: Payer: Self-pay | Admitting: Obstetrics & Gynecology

## 2021-11-03 ENCOUNTER — Ambulatory Visit: Payer: Medicaid Other

## 2021-11-05 ENCOUNTER — Ambulatory Visit (INDEPENDENT_AMBULATORY_CARE_PROVIDER_SITE_OTHER): Payer: Medicaid Other | Admitting: Obstetrics & Gynecology

## 2021-11-05 ENCOUNTER — Encounter: Payer: Self-pay | Admitting: Obstetrics & Gynecology

## 2021-11-05 VITALS — BP 145/89 | HR 97 | Wt 313.0 lb

## 2021-11-05 DIAGNOSIS — O24415 Gestational diabetes mellitus in pregnancy, controlled by oral hypoglycemic drugs: Secondary | ICD-10-CM

## 2021-11-05 DIAGNOSIS — Z3A31 31 weeks gestation of pregnancy: Secondary | ICD-10-CM

## 2021-11-05 DIAGNOSIS — I10 Essential (primary) hypertension: Secondary | ICD-10-CM

## 2021-11-05 DIAGNOSIS — E038 Other specified hypothyroidism: Secondary | ICD-10-CM

## 2021-11-05 DIAGNOSIS — O09893 Supervision of other high risk pregnancies, third trimester: Secondary | ICD-10-CM

## 2021-11-05 LAB — POCT URINALYSIS DIPSTICK OB
Blood, UA: NEGATIVE
Glucose, UA: NEGATIVE
Leukocytes, UA: NEGATIVE
Nitrite, UA: NEGATIVE
POC,PROTEIN,UA: NEGATIVE

## 2021-11-05 MED ORDER — METFORMIN HCL 500 MG PO TABS: ORAL_TABLET | ORAL | 2 refills | Status: DC

## 2021-11-05 MED ORDER — NIFEDIPINE ER OSMOTIC RELEASE 30 MG PO TB24: 30.0000 mg | ORAL_TABLET | Freq: Every day | ORAL | 1 refills | Status: DC

## 2021-11-05 NOTE — Progress Notes (Signed)
HIGH-RISK PREGNANCY VISIT Patient name: Cheyenne Moore MRN [id number]  Date of birth: [birth year] Chief Complaint:   Routine Prenatal Visit  History of Present Illness:   Cheyenne Moore is a 31 y.o. G7P1011 female at [redacted]w[redacted]d with an Estimated Date of Delivery: 01/05/22 being seen today for ongoing management of a high-risk pregnancy complicated by chronic hypertension currently on norvasc 10, adding procardia xl 30 and diabetes mellitus A2DM currently on metformin .    Today she reports no complaints. Contractions: Not present. Vag. Bleeding: None.  Movement: Present. denies leaking of fluid.      07/02/2021    9:00 AM 02/02/2021    2:57 PM 07/24/2019    9:08 AM 04/11/2019    9:08 AM 05/03/2018   10:17 AM  Depression screen PHQ 2/9  Decreased Interest 0 0 0 0 0  Down, Depressed, Hopeless 0 0 0 0 0  PHQ - 2 Score 0 0 0 0 0  Altered sleeping 0 0 0 0   Tired, decreased energy 2 1 1  0   Change in appetite 0 1 0 0   Feeling bad or failure about yourself  0 0 0 0   Trouble concentrating 0 0 0 0   Moving slowly or fidgety/restless 0 0 0 0   Suicidal thoughts 0 0 0 0   PHQ-9 Score 2 2 1  0         07/02/2021    9:00 AM 02/02/2021    2:57 PM 07/24/2019    9:08 AM  GAD 7 : Generalized Anxiety Score  Nervous, Anxious, on Edge 2 1 0  Control/stop worrying 1 0 0  Worry too much - different things 1 0 0  Trouble relaxing 0 0 0  Restless 0 0 0  Easily annoyed or irritable 1 0 0  Afraid - awful might happen 1 0 0  Total GAD 7 Score 6 1 0     Review of Systems:   Pertinent items are noted in HPI Denies abnormal vaginal discharge w/ itching/odor/irritation, headaches, visual changes, shortness of breath, chest pain, abdominal pain, severe nausea/vomiting, or problems with urination or bowel movements unless otherwise stated above. Pertinent History Reviewed:  Reviewed past medical,surgical, social, obstetrical and family history.  Reviewed problem list, medications and  allergies. Physical Assessment:   Vitals:   11/05/21 1225 11/05/21 1234  BP: (!) 153/94 (!) 145/89  Pulse: 99 97  Weight: (!) 313 lb (142 kg)   Body mass index is 50.52 kg/m.           Physical Examination:   General appearance: alert, well appearing, and in no distress  Mental status: alert, oriented to person, place, and time  Skin: warm & dry   Extremities: Edema: None    Cardiovascular: normal heart rate noted  Respiratory: normal respiratory effort, no distress  Abdomen: gravid, soft, non-tender  Pelvic: Cervical exam deferred         Fetal Status:     Movement: Present    Fetal Surveillance Testing today: none   Chaperone: Celene Squibb    Results for orders placed or performed in visit on 11/05/21 (from the past 24 hour(s))  POC Urinalysis Dipstick OB   Collection Time: 11/05/21 12:32 PM  Result Value Ref Range   Color, UA     Clarity, UA     Glucose, UA Negative Negative   Bilirubin, UA     Ketones, UA Trace    Spec Grav, UA  Blood, UA Negative    pH, UA     POC,PROTEIN,UA Negative Negative, Trace, Small (1+), Moderate (2+), Large (3+), 4+   Urobilinogen, UA     Nitrite, UA Negative    Leukocytes, UA Negative Negative   Appearance     Odor      Assessment & Plan:  High-risk pregnancy: G3P1011 at [redacted]w[redacted]d with an Estimated Date of Delivery: 01/05/22      ICD-10-CM   1. Supervision of other high risk pregnancies, third trimester  O09.893 POC Urinalysis Dipstick OB    2. Gestational diabetes mellitus (GDM) in third trimester controlled on oral hypoglycemic drug  O24.415 metFORMIN (GLUCOPHAGE) 500 MG tablet   fastings are still a bit up, increase hs metformin to 1000 mg, continue metformin 500 mg in am    3. Chronic hypertension  I10    Norvasc 10, adding procardia xl 30 mg at night    4. [redacted] weeks gestation of pregnancy  Z3A.31 POC Urinalysis Dipstick OB    5. Other specified hypothyroidism  E03.8    [place] thyroid 90mg         Meds:  Meds  ordered this encounter  Medications   metFORMIN (GLUCOPHAGE) 500 MG tablet    Sig: 1 (500 mg) in am with breakfast, 2 (1000 mg) at bedtime    Dispense:  90 tablet    Refill:  2   NIFEdipine (PROCARDIA XL) 30 MG 24 hr tablet    Sig: Take 1 tablet (30 mg total) by mouth daily.    Dispense:  30 tablet    Refill:  1    Orders:  Orders Placed This Encounter  Procedures   POC Urinalysis Dipstick OB     Labs/procedures today: none  Treatment Plan:  begin twice weekly visits next week, sonogram + NST  Reviewed: Preterm labor symptoms and general obstetric precautions including but not limited to vaginal bleeding, contractions, leaking of fluid and fetal movement were reviewed in detail with the patient.  All questions were answered. Does have home bp cuff. Office bp cuff given: yes. Check bp daily, let us know if consistently >140 and/or >90.  Follow-up: No follow-ups on file.   Future Appointments  Date Time Provider [place]  11/11/2021 11:15 AM [facility]  11/12/2021 10:45 AM [place] [facility] [place] [facility] None  11/12/2021 11:30 AM Janyth Pupa, DO [facility]    Orders Placed This Encounter  Procedures   POC Urinalysis Dipstick OB   Florian Buff  Attending Physician for the Center for [place] [facility] 11/05/2021 1:03 PM

## 2021-11-11 ENCOUNTER — Other Ambulatory Visit: Payer: Medicaid Other

## 2021-11-11 ENCOUNTER — Other Ambulatory Visit: Payer: Self-pay | Admitting: Obstetrics & Gynecology

## 2021-11-11 DIAGNOSIS — O24419 Gestational diabetes mellitus in pregnancy, unspecified control: Secondary | ICD-10-CM

## 2021-11-11 DIAGNOSIS — O10919 Unspecified pre-existing hypertension complicating pregnancy, unspecified trimester: Secondary | ICD-10-CM

## 2021-11-12 ENCOUNTER — Ambulatory Visit (INDEPENDENT_AMBULATORY_CARE_PROVIDER_SITE_OTHER): Payer: Medicaid Other

## 2021-11-12 ENCOUNTER — Ambulatory Visit (INDEPENDENT_AMBULATORY_CARE_PROVIDER_SITE_OTHER): Payer: Medicaid Other | Admitting: Obstetrics & Gynecology

## 2021-11-12 ENCOUNTER — Encounter: Payer: Self-pay | Admitting: Obstetrics & Gynecology

## 2021-11-12 VITALS — BP 136/88 | HR 93 | Wt 314.2 lb

## 2021-11-12 DIAGNOSIS — O10919 Unspecified pre-existing hypertension complicating pregnancy, unspecified trimester: Secondary | ICD-10-CM | POA: Diagnosis not present

## 2021-11-12 DIAGNOSIS — O24419 Gestational diabetes mellitus in pregnancy, unspecified control: Secondary | ICD-10-CM

## 2021-11-12 DIAGNOSIS — O0993 Supervision of high risk pregnancy, unspecified, third trimester: Secondary | ICD-10-CM | POA: Diagnosis not present

## 2021-11-12 DIAGNOSIS — Z23 Encounter for immunization: Secondary | ICD-10-CM | POA: Diagnosis not present

## 2021-11-12 DIAGNOSIS — O24415 Gestational diabetes mellitus in pregnancy, controlled by oral hypoglycemic drugs: Secondary | ICD-10-CM

## 2021-11-12 DIAGNOSIS — Z3A32 32 weeks gestation of pregnancy: Secondary | ICD-10-CM | POA: Diagnosis not present

## 2021-11-12 DIAGNOSIS — I10 Essential (primary) hypertension: Secondary | ICD-10-CM

## 2021-11-12 LAB — POCT URINALYSIS DIPSTICK OB
Blood, UA: NEGATIVE
Glucose, UA: NEGATIVE
Leukocytes, UA: NEGATIVE
Nitrite, UA: NEGATIVE
POC,PROTEIN,UA: NEGATIVE

## 2021-11-12 NOTE — Progress Notes (Signed)
HIGH-RISK PREGNANCY VISIT Patient name: Cheyenne Moore MRN [id number]  Date of birth: Nov 18, 1990 Chief Complaint:   Routine Prenatal Visit  History of Present Illness:   Cheyenne Moore is a 31 y.o. G47P1011 female at 6w2dwith an Estimated Date of Delivery: 01/05/22 being seen today for ongoing management of a high-risk pregnancy complicated by:  -GDMA2- last visit increased to 2 tabs with dinner, notes improvement of fastings Did not bring log today.  Per pt- after meals 110-130s, 90s fastings  -Chronic HTN per pt Dr. EElonda Huskyadded procardia XL 364mdaily   Today she reports no complaints.   GDMA2 50054mm/1000m13m   Contractions: Not present. Vag. Bleeding: None.  Movement: Present. denies leaking of fluid.      07/02/2021    9:00 AM 02/02/2021    2:57 PM 07/24/2019    9:08 AM 04/11/2019    9:08 AM 05/03/2018   10:17 AM  Depression screen PHQ 2/9  Decreased Interest 0 0 0 0 0  Down, Depressed, Hopeless 0 0 0 0 0  PHQ - 2 Score 0 0 0 0 0  Altered sleeping 0 0 0 0   Tired, decreased energy _0 0   Change in appetite 0 1 0 0   Feeling bad or failure about yourself  0 0 0 0   Trouble concentrating 0 0 0 0   Moving slowly or fidgety/restless 0 0 0 0   Suicidal thoughts 0 0 0 0   PHQ-9 Score _1 0      Current Outpatient Medications  Medication Instructions   Accu-Chek Softclix Lancets lancets Use as instructed to check blood sugar 4 times daily   Accu-Chek Softclix Lancets lancets Use as instructed to check blood sugar 4 times daily   acetaminophen (TYLENOL) 650 mg, Oral, Every 6 hours PRN   amLODipine (NORVASC) 10 MG tablet TAKE 1 TABLET(10 MG) BY MOUTH DAILY   ARMOUR THYROID 90 MG tablet TAKE 1 TABLET BY MOUTH DAILY BEFORE BREAKFAST   aspirin 162 mg, Oral, Daily   Blood Glucose Monitoring Suppl (ACCU-CHEK GUIDE ME) w/Device KIT 1 each, Does not apply, 4 times daily   cholecalciferol (VITAMIN D) 10,000 Units, Oral, Daily   dextromethorphan-guaiFENesin (MUCINEX DM)  30-600 MG 12hr tablet 1 tablet, 2 times daily   glucose blood (ACCU-CHEK GUIDE) test strip Use as instructed to check blood sugar 4 times daily   guaifenesin (ROBITUSSIN) 200 mg, 3 times daily PRN   Loratadine (CLARITIN PO) Oral   metFORMIN (GLUCOPHAGE) 500 MG tablet 1 (500 mg) in am with breakfast, 2 (1000 mg) at bedtime   NIFEdipine (PROCARDIA XL) 30 mg, Oral, Daily   omeprazole (PRILOSEC) 20 MG capsule TAKE 1 CAPSULE(20 MG) BY MOUTH DAILY   Prenatal Vit-Fe Fumarate-FA (PRENATAL MULTIVITAMIN) TABS tablet 1 tablet, Oral, Daily     Review of Systems:   Pertinent items are noted in HPI Denies abnormal vaginal discharge w/ itching/odor/irritation, headaches, visual changes, shortness of breath, chest pain, abdominal pain, severe nausea/vomiting, or problems with urination or bowel movements unless otherwise stated above. Pertinent History Reviewed:  Reviewed past medical,surgical, social, obstetrical and family history.  Reviewed problem list, medications and allergies. Physical Assessment:   Vitals:   11/12/21 1128  BP: 136/88  Pulse: 93  Weight: (!) 314 lb 4 oz (142.5 kg)  Body mass index is 50.72 kg/m.           Physical Examination:   General appearance: alert, well appearing,  and in no distress  Mental status: normal mood, behavior, speech, dress, motor activity, and thought processes  Skin: warm & dry   Extremities: Edema: None    Cardiovascular: normal heart rate noted  Respiratory: normal respiratory effort, no distress  Abdomen: gravid, soft, non-tender  Pelvic: Cervical exam deferred         Fetal Status:     Movement: Present    Fetal Surveillance Testing today: breech,posterior placenta gr 3,AFI 19.7 cm,FHR 152 bpm,BPP 8/8,RI .55,.58,.46=17%,EFW 2592 g 98%   Chaperone: N/A    Results for orders placed or performed in visit on 11/12/21 (from the past 24 hour(s))  POC Urinalysis Dipstick OB   Collection Time: 11/12/21 11:37 AM  Result Value Ref Range   Color, UA      Clarity, UA     Glucose, UA Negative Negative   Bilirubin, UA     Ketones, UA small    Spec Grav, UA     Blood, UA negative    pH, UA     POC,PROTEIN,UA Negative Negative, Trace, Small (1+), Moderate (2+), Large (3+), 4+   Urobilinogen, UA     Nitrite, UA negative    Leukocytes, UA Negative Negative   Appearance     Odor       Assessment & Plan:  High-risk pregnancy: G3P1011 at 2w2dwith an Estimated Date of Delivery: 01/05/22   1) GDMA2 -encouraged pt to bring log every visit -suspect better control with change in metformin -continue antepartum testing twice weekly and growth q 4wks -due to LGA discussed earlier IOL   2) Chronic HTN -continue current meds and will closely monitor -reviewed precautions  3) Breech presentation -briefly discussed ECV -plan to check presentation at 35-36wk visit  -morbid obesity- BMI 50 -hypothyroidism- continue current meds  Meds: No orders of the defined types were placed in this encounter.   Labs/procedures today: BPP/growth  Treatment Plan:  as outlined above  Reviewed: Preterm labor symptoms and general obstetric precautions including but not limited to vaginal bleeding, contractions, leaking of fluid and fetal movement were reviewed in detail with the patient.  All questions were answered. Pt has home bp cuff. Check bp weekly, let [place]now if >140/90.   Follow-up: Return in about 2 weeks (around 11/26/2021) for Starting now needs twice weekly NST and growth q 4wks.   Future Appointments  Date Time Provider [place] 11/19/2021 10:30 AM [facility] NURSE [facility]  11/19/2021 10:50 AM OJanyth Pupa DO [facility]  11/19/2021 12:15 PM [facility] [place] None  11/26/2021 10:50 AM [facility] NURSE [facility]  11/26/2021 11:10 AM EFlorian Buff MD [facility]  12/17/2021  8:30 AM [facility] [place] None  12/24/2021  8:30 AM [facility] [place] None  12/31/2021  9:15 AM [facility] [place] None    Orders Placed This Encounter  Procedures   Tdap vaccine greater than or equal to 7yo IM   Flu Vaccine QUAD 663moM (Fluarix, Fluzone & Alfiuria Quad PF)   POC Urinalysis Dipstick OB    JeJanyth PupaDO Attending ObBushFaGilmanor WoDe[facility] CompanyCo[place]

## 2021-11-12 NOTE — Progress Notes (Signed)
[place] 32+2 wks,breech,posterior placenta gr 3,AFI 19.7 cm,FHR 152 bpm,BPP 8/8,RI .55,.58,.46=17%,EFW 2592 g 98%

## 2021-11-16 ENCOUNTER — Ambulatory Visit (INDEPENDENT_AMBULATORY_CARE_PROVIDER_SITE_OTHER): Payer: Medicaid Other | Admitting: *Deleted

## 2021-11-16 VITALS — BP 147/95 | HR 93

## 2021-11-16 DIAGNOSIS — O24415 Gestational diabetes mellitus in pregnancy, controlled by oral hypoglycemic drugs: Secondary | ICD-10-CM | POA: Diagnosis not present

## 2021-11-16 DIAGNOSIS — O0993 Supervision of high risk pregnancy, unspecified, third trimester: Secondary | ICD-10-CM | POA: Diagnosis not present

## 2021-11-16 DIAGNOSIS — I10 Essential (primary) hypertension: Secondary | ICD-10-CM | POA: Diagnosis not present

## 2021-11-16 DIAGNOSIS — Z3A32 32 weeks gestation of pregnancy: Secondary | ICD-10-CM | POA: Diagnosis not present

## 2021-11-16 LAB — POCT URINALYSIS DIPSTICK OB
Blood, UA: NEGATIVE
Glucose, UA: NEGATIVE
Leukocytes, UA: NEGATIVE
Nitrite, UA: NEGATIVE
POC,PROTEIN,UA: NEGATIVE

## 2021-11-16 NOTE — Progress Notes (Signed)
   NURSE VISIT- NST  SUBJECTIVE:  Cheyenne Moore is a 31 y.o. G20P1011 female at [redacted]w[redacted]d, here for a NST for pregnancy complicated by [facility] and Q7MA currently on Metformin .  She reports active fetal movement, contractions: none, vaginal bleeding: none, membranes: intact. Reports a headache off/on this weekend and blood pressures have been elevated as well despite taking medication as prescribed.  OBJECTIVE:  BP (!) 147/95   Pulse 93   LMP  (LMP Unknown)   Appears well, no apparent distress  Results for orders placed or performed in visit on 11/16/21 (from the past 24 hour(s))  POC Urinalysis Dipstick OB   Collection Time: 11/16/21 11:55 AM  Result Value Ref Range   Color, UA     Clarity, UA     Glucose, UA Negative Negative   Bilirubin, UA     Ketones, UA moderate    Spec Grav, UA     Blood, UA neg    pH, UA     POC,PROTEIN,UA Negative Negative, Trace, Small (1+), Moderate (2+), Large (3+), 4+   Urobilinogen, UA     Nitrite, UA neg    Leukocytes, UA Negative Negative   Appearance     Odor      NST: FHR baseline 135 bpm, Variability: moderate, Accelerations:present, Decelerations:  Absent= Cat 1/reactive Toco: none   ASSESSMENT: G3P1011 at [redacted]w[redacted]d with CHTN and A2DM currently on Metformin NST reactive  PLAN: EFM strip reviewed by Dr. Elonda Husky   Recommendations: keep next appointment as scheduled   Increase Procardia to BID, labs ordered  Alice Rieger  11/16/2021 11:57 AM

## 2021-11-17 ENCOUNTER — Other Ambulatory Visit: Payer: Self-pay | Admitting: Obstetrics & Gynecology

## 2021-11-17 DIAGNOSIS — O10919 Unspecified pre-existing hypertension complicating pregnancy, unspecified trimester: Secondary | ICD-10-CM

## 2021-11-17 DIAGNOSIS — O24419 Gestational diabetes mellitus in pregnancy, unspecified control: Secondary | ICD-10-CM

## 2021-11-17 LAB — COMPREHENSIVE METABOLIC PANEL
ALT: 11 IU/L (ref 0–32)
AST: 21 IU/L (ref 0–40)
Albumin/Globulin Ratio: 1.3 (ref 1.2–2.2)
Albumin: 3.5 g/dL — ABNORMAL LOW (ref 4.0–5.0)
Alkaline Phosphatase: 124 IU/L — ABNORMAL HIGH (ref 44–121)
BUN/Creatinine Ratio: 10 (ref 9–23)
BUN: 4 mg/dL — ABNORMAL LOW (ref 6–20)
Bilirubin Total: 0.4 mg/dL (ref 0.0–1.2)
CO2: 17 mmol/L — ABNORMAL LOW (ref 20–29)
Calcium: 9.6 mg/dL (ref 8.7–10.2)
Chloride: 106 mmol/L (ref 96–106)
Creatinine, Ser: 0.4 mg/dL — ABNORMAL LOW (ref 0.57–1.00)
Globulin, Total: 2.6 g/dL (ref 1.5–4.5)
Glucose: 77 mg/dL (ref 70–99)
Potassium: 3.8 mmol/L (ref 3.5–5.2)
Sodium: 139 mmol/L (ref 134–144)
Total Protein: 6.1 g/dL (ref 6.0–8.5)
eGFR: 137 mL/min/{1.73_m2} (ref >59)

## 2021-11-17 LAB — PROTEIN / CREATININE RATIO, URINE
Creatinine, Urine: 83.3 mg/dL
Protein, Ur: 13 mg/dL
Protein/Creat Ratio: 156 mg/g creat (ref 0–200)

## 2021-11-17 LAB — CBC
Hematocrit: 36.3 % (ref 34.0–46.6)
Hemoglobin: 12.3 g/dL (ref 11.1–15.9)
MCH: 29.4 pg (ref 26.6–33.0)
MCHC: 33.9 g/dL (ref 31.5–35.7)
MCV: 87 fL (ref 79–97)
Platelets: 264 10*3/uL (ref 150–450)
RBC: 4.19 x10E6/uL (ref 3.77–5.28)
RDW: 13.3 % (ref 11.7–15.4)
WBC: 12.9 10*3/uL — ABNORMAL HIGH (ref 3.4–10.8)

## 2021-11-18 ENCOUNTER — Ambulatory Visit (INDEPENDENT_AMBULATORY_CARE_PROVIDER_SITE_OTHER): Payer: Medicaid Other | Admitting: Obstetrics & Gynecology

## 2021-11-18 ENCOUNTER — Ambulatory Visit (INDEPENDENT_AMBULATORY_CARE_PROVIDER_SITE_OTHER): Payer: Medicaid Other

## 2021-11-18 VITALS — BP 145/91 | HR 92 | Wt 314.0 lb

## 2021-11-18 DIAGNOSIS — Z3A33 33 weeks gestation of pregnancy: Secondary | ICD-10-CM | POA: Diagnosis not present

## 2021-11-18 DIAGNOSIS — E038 Other specified hypothyroidism: Secondary | ICD-10-CM

## 2021-11-18 DIAGNOSIS — O24419 Gestational diabetes mellitus in pregnancy, unspecified control: Secondary | ICD-10-CM

## 2021-11-18 DIAGNOSIS — O10919 Unspecified pre-existing hypertension complicating pregnancy, unspecified trimester: Secondary | ICD-10-CM

## 2021-11-18 DIAGNOSIS — O0993 Supervision of high risk pregnancy, unspecified, third trimester: Secondary | ICD-10-CM

## 2021-11-18 DIAGNOSIS — I10 Essential (primary) hypertension: Secondary | ICD-10-CM

## 2021-11-18 DIAGNOSIS — O24415 Gestational diabetes mellitus in pregnancy, controlled by oral hypoglycemic drugs: Secondary | ICD-10-CM

## 2021-11-18 LAB — POCT URINALYSIS DIPSTICK OB
Blood, UA: NEGATIVE
Glucose, UA: NEGATIVE
Nitrite, UA: NEGATIVE

## 2021-11-18 NOTE — Progress Notes (Signed)
HIGH-RISK PREGNANCY VISIT Patient name: Cheyenne Moore MRN [id number]  Date of birth: 1990/06/19 Chief Complaint:   Routine Prenatal Visit  History of Present Illness:   Cheyenne Moore is a 31 y.o. G67P1011 female at [redacted]w[redacted]d with an Estimated Date of Delivery: 01/05/22 being seen today for ongoing management of a high-risk pregnancy complicated by [facility] on norvasc 10 and procardia 30, A2DM on metformin 500 am 1000 qhs, hypothyroid on armour 90 mg.    Today she reports no complaints. Contractions: Not present. Vag. Bleeding: None.  Movement: Present. denies leaking of fluid.      07/02/2021    9:00 AM 02/02/2021    2:57 PM 07/24/2019    9:08 AM 04/11/2019    9:08 AM 05/03/2018   10:17 AM  Depression screen PHQ 2/9  Decreased Interest 0 0 0 0 0  Down, Depressed, Hopeless 0 0 0 0 0  PHQ - 2 Score 0 0 0 0 0  Altered sleeping 0 0 0 0   Tired, decreased energy 2 1 1  0   Change in appetite 0 1 0 0   Feeling bad or failure about yourself  0 0 0 0   Trouble concentrating 0 0 0 0   Moving slowly or fidgety/restless 0 0 0 0   Suicidal thoughts 0 0 0 0   PHQ-9 Score 2 2 1  0         07/02/2021    9:00 AM 02/02/2021    2:57 PM 07/24/2019    9:08 AM  GAD 7 : Generalized Anxiety Score  Nervous, Anxious, on Edge 2 1 0  Control/stop worrying 1 0 0  Worry too much - different things 1 0 0  Trouble relaxing 0 0 0  Restless 0 0 0  Easily annoyed or irritable 1 0 0  Afraid - awful might happen 1 0 0  Total GAD 7 Score 6 1 0     Review of Systems:   Pertinent items are noted in HPI Denies abnormal vaginal discharge w/ itching/odor/irritation, headaches, visual changes, shortness of breath, chest pain, abdominal pain, severe nausea/vomiting, or problems with urination or bowel movements unless otherwise stated above. Pertinent History Reviewed:  Reviewed past medical,surgical, social, obstetrical and family history.  Reviewed problem list, medications and allergies. Physical Assessment:    Vitals:   11/18/21 1519 11/18/21 1524 11/18/21 1525  BP: (!) 144/94 (!) 145/91   Pulse: 87 92   Weight:   (!) 314 lb (142.4 kg)  Body mass index is 50.68 kg/m.           Physical Examination:   General appearance: alert, well appearing, and in no distress  Mental status: alert, oriented to person, place, and time  Skin: warm & dry   Extremities: Edema: Trace    Cardiovascular: normal heart rate noted  Respiratory: normal respiratory effort, no distress  Abdomen: gravid, soft, non-tender  Pelvic: Cervical exam deferred         Fetal Status:     Movement: Present    Fetal Surveillance Testing today: BPP 8/8   Chaperone: N/A    Results for orders placed or performed in visit on 11/18/21 (from the past 24 hour(s))  POC Urinalysis Dipstick OB   Collection Time: 11/18/21  3:21 PM  Result Value Ref Range   Color, UA     Clarity, UA     Glucose, UA Negative Negative   Bilirubin, UA     Ketones, UA small  Spec Grav, UA     Blood, UA neg    pH, UA     POC,PROTEIN,UA Trace Negative, Trace, Small (1+), Moderate (2+), Large (3+), 4+   Urobilinogen, UA     Nitrite, UA neg    Leukocytes, UA Trace (A) Negative   Appearance     Odor      Assessment & Plan:  High-risk pregnancy: G3P1011 at [redacted]w[redacted]d with an Estimated Date of Delivery: 01/05/22      ICD-10-CM   1. Supervision of high risk pregnancy in third trimester  O09.93 POC Urinalysis Dipstick OB    2. [redacted] weeks gestation of pregnancy  Z3A.33 POC Urinalysis Dipstick OB    3. Gestational diabetes mellitus (GDM) in third trimester controlled on oral hypoglycemic drug  O24.415    Fasting CBG better on 1000 mg metformin qhs, 2 hour pp good    4. Chronic hypertension  I10    norvasc 10 mg daily, procardia xl 30 qd    5. Other specified hypothyroidism  E03.8         Meds: No orders of the defined types were placed in this encounter.   Orders:  Orders Placed This Encounter  Procedures   POC Urinalysis Dipstick OB      Labs/procedures today: U/S  Treatment Plan:  twice weekly surveillance  Reviewed: Preterm labor symptoms and general obstetric precautions including but not limited to vaginal bleeding, contractions, leaking of fluid and fetal movement were reviewed in detail with the patient.  All questions were answered. Does have home bp cuff. Office bp cuff given: not applicable. Check bp twice daily, let us know if consistently >150 and/or >95.  Follow-up: No follow-ups on file.   Future Appointments  Date Time Provider [place]  11/23/2021 10:30 AM [facility] NURSE [facility]  11/26/2021 10:50 AM [facility] NURSE [facility]  11/26/2021 11:10 AM Florian Buff, MD [facility]  11/30/2021 10:10 AM [facility] NURSE [facility]  12/03/2021 11:15 AM [facility] [place] [facility] None  12/03/2021 12:30 PM Janyth Pupa, DO [facility]  12/07/2021 10:50 AM [facility] NURSE [facility]  12/14/2021 10:10 AM [facility] NURSE [facility]  12/17/2021  8:30 AM [facility] - [facility] [place] [facility] None  12/17/2021  9:50 AM Janyth Pupa, DO [facility]  12/21/2021 10:10 AM [facility] NURSE [facility]  12/24/2021  8:30 AM [facility] - [facility] [place] [facility] None  12/24/2021  9:30 AM Florian Buff, MD [facility]  12/28/2021 10:10 AM [facility] NURSE [facility]  12/31/2021  9:15 AM [facility] - [facility] [place] [facility] None  12/31/2021 10:10 AM Janyth Pupa, DO [facility]  01/04/2022 10:30 AM [facility] NURSE [facility]    Orders Placed This Encounter  Procedures   POC Urinalysis Dipstick OB   Florian Buff  Attending Physician for the [facility] 11/18/2021 3:35 PM

## 2021-11-18 NOTE — Progress Notes (Signed)
[place] 33+1 wks,transverse head left,posterior placenta gr 3,AFI 13.9 cm,RI.53,.64,.58=44%,BPP 8/8

## 2021-11-19 ENCOUNTER — Other Ambulatory Visit: Payer: Medicaid Other

## 2021-11-19 ENCOUNTER — Encounter: Payer: Medicaid Other | Admitting: Obstetrics & Gynecology

## 2021-11-23 ENCOUNTER — Ambulatory Visit (INDEPENDENT_AMBULATORY_CARE_PROVIDER_SITE_OTHER): Payer: Medicaid Other | Admitting: *Deleted

## 2021-11-23 VITALS — BP 118/78 | HR 101 | Wt 315.6 lb

## 2021-11-23 DIAGNOSIS — O288 Other abnormal findings on antenatal screening of mother: Secondary | ICD-10-CM | POA: Diagnosis not present

## 2021-11-23 DIAGNOSIS — Z1389 Encounter for screening for other disorder: Secondary | ICD-10-CM | POA: Diagnosis not present

## 2021-11-23 DIAGNOSIS — Z3A33 33 weeks gestation of pregnancy: Secondary | ICD-10-CM

## 2021-11-23 DIAGNOSIS — Z331 Pregnant state, incidental: Secondary | ICD-10-CM

## 2021-11-23 LAB — POCT URINALYSIS DIPSTICK OB
Blood, UA: NEGATIVE
Glucose, UA: NEGATIVE
Nitrite, UA: NEGATIVE

## 2021-11-23 NOTE — Progress Notes (Signed)
   NURSE VISIT- NST  SUBJECTIVE:  Cheyenne Moore is a 31 y.o. G45P1011 female at [redacted]w[redacted]d, here for a NST for pregnancy complicated by [facility] and Q3FH currently on metformin .  She reports active fetal movement, contractions: none, vaginal bleeding: none, membranes: intact.   OBJECTIVE:  BP 118/78   Pulse (!) 101   Wt (!) 315 lb 9.6 oz (143.2 kg)   LMP  (LMP Unknown)   BMI 50.94 kg/m   Appears well, no apparent distress  No results found for this or any previous visit (from the past 24 hour(s)).  NST: FHR baseline 160 bpm, Variability: moderate, Accelerations:present, Decelerations:  Absent= Cat 1/reactive Toco: none   ASSESSMENT: G3P1011 at [redacted]w[redacted]d with CHTN and A2DM currently on metformin NST reactive  PLAN: EFM strip reviewed by Dr. Elonda Husky   Recommendations: keep next appointment as scheduled    Baby was very active during tracing making it hard to keep patient on monitor. Strip reviewed by Dr. Elonda Husky.   Cheyenne Moore  11/23/2021 11:27 AM

## 2021-11-26 ENCOUNTER — Ambulatory Visit (INDEPENDENT_AMBULATORY_CARE_PROVIDER_SITE_OTHER): Payer: Medicaid Other | Admitting: Obstetrics & Gynecology

## 2021-11-26 ENCOUNTER — Other Ambulatory Visit: Payer: Medicaid Other

## 2021-11-26 ENCOUNTER — Encounter: Payer: Self-pay | Admitting: Obstetrics & Gynecology

## 2021-11-26 VITALS — BP 114/73 | HR 87 | Wt 315.6 lb

## 2021-11-26 DIAGNOSIS — Z331 Pregnant state, incidental: Secondary | ICD-10-CM

## 2021-11-26 DIAGNOSIS — O24415 Gestational diabetes mellitus in pregnancy, controlled by oral hypoglycemic drugs: Secondary | ICD-10-CM | POA: Diagnosis not present

## 2021-11-26 DIAGNOSIS — O0990 Supervision of high risk pregnancy, unspecified, unspecified trimester: Secondary | ICD-10-CM | POA: Diagnosis not present

## 2021-11-26 DIAGNOSIS — Z1389 Encounter for screening for other disorder: Secondary | ICD-10-CM

## 2021-11-26 DIAGNOSIS — I10 Essential (primary) hypertension: Secondary | ICD-10-CM

## 2021-11-26 DIAGNOSIS — Z3A34 34 weeks gestation of pregnancy: Secondary | ICD-10-CM

## 2021-11-26 LAB — POCT URINALYSIS DIPSTICK OB
Blood, UA: NEGATIVE
Glucose, UA: NEGATIVE
Ketones, UA: 15
Nitrite, UA: NEGATIVE

## 2021-11-26 MED ORDER — NIFEDIPINE ER OSMOTIC RELEASE 30 MG PO TB24: 30.0000 mg | ORAL_TABLET | Freq: Two times a day (BID) | ORAL | 1 refills | Status: DC

## 2021-11-26 NOTE — Progress Notes (Signed)
HIGH-RISK PREGNANCY VISIT Patient name: Cheyenne Moore MRN [id number]  Date of birth: 07/31/1990 Chief Complaint:   Routine Prenatal Visit, High Risk Gestation, and Non-stress Test  History of Present Illness:   Cheyenne Moore is a 31 y.o. G36P1011 female at [redacted]w[redacted]d with an Estimated Date of Delivery: 01/05/22 being seen today for ongoing management of a high-risk pregnancy complicated by chronic hypertension currently on procardia xl 30 BID norvasc 10 and diabetes mellitus A2DM currently on metformin 500/1000 .    Today she reports no complaints. Contractions: Not present.  .  Movement: Present. denies leaking of fluid.      07/02/2021    9:00 AM 02/02/2021    2:57 PM 07/24/2019    9:08 AM 04/11/2019    9:08 AM 05/03/2018   10:17 AM  Depression screen PHQ 2/9  Decreased Interest 0 0 0 0 0  Down, Depressed, Hopeless 0 0 0 0 0  PHQ - 2 Score 0 0 0 0 0  Altered sleeping 0 0 0 0   Tired, decreased energy 2 1 1  0   Change in appetite 0 1 0 0   Feeling bad or failure about yourself  0 0 0 0   Trouble concentrating 0 0 0 0   Moving slowly or fidgety/restless 0 0 0 0   Suicidal thoughts 0 0 0 0   PHQ-9 Score 2 2 1  0         07/02/2021    9:00 AM 02/02/2021    2:57 PM 07/24/2019    9:08 AM  GAD 7 : Generalized Anxiety Score  Nervous, Anxious, on Edge 2 1 0  Control/stop worrying 1 0 0  Worry too much - different things 1 0 0  Trouble relaxing 0 0 0  Restless 0 0 0  Easily annoyed or irritable 1 0 0  Afraid - awful might happen 1 0 0  Total GAD 7 Score 6 1 0     Review of Systems:   Pertinent items are noted in HPI Denies abnormal vaginal discharge w/ itching/odor/irritation, headaches, visual changes, shortness of breath, chest pain, abdominal pain, severe nausea/vomiting, or problems with urination or bowel movements unless otherwise stated above. Pertinent History Reviewed:  Reviewed past medical,surgical, social, obstetrical and family history.  Reviewed problem list,  medications and allergies. Physical Assessment:   Vitals:   11/26/21 1107  BP: 114/73  Pulse: 87  Weight: (!) 315 lb 9.6 oz (143.2 kg)  Body mass index is 50.94 kg/m.           Physical Examination:   General appearance: alert, well appearing, and in no distress  Mental status: alert, oriented to person, place, and time  Skin: warm & dry   Extremities: Edema: None    Cardiovascular: normal heart rate noted  Respiratory: normal respiratory effort, no distress  Abdomen: gravid, soft, non-tender  Pelvic: Cervical exam deferred         Fetal Status:     Movement: Present    Fetal Surveillance Testing today: Cheyenne Moore is at [redacted]w[redacted]d Estimated Date of Delivery: 01/05/22  NST being performed due to [facility] A2DM  Today the NST is Reactive  Fetal Monitoring:  Baseline: 140s bpm, Variability: Good {> 6 bpm), Accelerations: Reactive, and Decelerations: Absent   reactive  The accelerations are >15 bpm and more than 2 in 20 minutes  Final diagnosis:  Reactive NST  01/07/22, MD      Chaperone: N/A  Results for orders placed or performed in visit on 11/26/21 (from the past 24 hour(s))  POC Urinalysis Dipstick OB   Collection Time: 11/26/21 11:29 AM  Result Value Ref Range   Color, UA     Clarity, UA     Glucose, UA Negative Negative   Bilirubin, UA     Ketones, UA 15    Spec Grav, UA     Blood, UA neg    pH, UA     POC,PROTEIN,UA Trace Negative, Trace, Small (1+), Moderate (2+), Large (3+), 4+   Urobilinogen, UA     Nitrite, UA neg    Leukocytes, UA Trace (A) Negative   Appearance     Odor      Assessment & Plan:  High-risk pregnancy: G3P1011 at [redacted]w[redacted]d with an Estimated Date of Delivery: 01/05/22      ICD-10-CM   1. Supervision of high risk pregnancy, antepartum  O09.90     2. Pregnant state, incidental  Z33.1 POC Urinalysis Dipstick OB    3. Screening for genitourinary condition  Z13.89 POC Urinalysis Dipstick OB    4. Gestational diabetes mellitus (GDM)  in third trimester controlled on oral hypoglycemic drug  O24.415     5. Chronic hypertension  I10         Meds:  Meds ordered this encounter  Medications   NIFEdipine (PROCARDIA XL) 30 MG 24 hr tablet    Sig: Take 1 tablet (30 mg total) by mouth in the morning and at bedtime.    Dispense:  60 tablet    Refill:  1    Orders:  Orders Placed This Encounter  Procedures   POC Urinalysis Dipstick OB     Labs/procedures today: NST  Treatment Plan:  twice weekly surveillance  Reviewed: Preterm labor symptoms and general obstetric precautions including but not limited to vaginal bleeding, contractions, leaking of fluid and fetal movement were reviewed in detail with the patient.  All questions were answered. Does have home bp cuff. Office bp cuff given: not applicable. Check bp daily, let us know if consistently >150 and/or >95.  Follow-up: Return for keep scheduled.   Future Appointments  Date Time Provider [place]  11/30/2021 10:10 AM [facility] NURSE [facility]  12/03/2021 11:15 AM [place] - FTOBGYN [place] [facility]-FT[facility]G None  12/03/2021 12:30 PM Janyth Pupa, DO [facility]  12/07/2021 10:50 AM [facility] NURSE [facility]  12/14/2021 10:10 AM [facility] NURSE [facility]  12/17/2021  8:30 AM [facility] - FTO[facility]YN [place] [facility] None  12/17/2021 10:10 AM Janyth Pupa, DO [facility]  12/21/2021 10:10 AM [facility] NURSE [facility]  12/24/2021  8:30 AM [facility] - [facility]YN [place] [facility] None  12/24/2021  9:30 AM Florian Buff, MD [facility]  12/28/2021 10:10 AM [facility] NURSE [facility]  12/31/2021  9:15 AM [facility] - F[facility]YN [place] [facility]-FT[facility] None  12/31/2021 10:10 AM Janyth Pupa, DO [facility]  01/04/2022 10:30 AM [facility] NURSE [facility]    Orders Placed This Encounter  Procedures   POC Urinalysis Dipstick OB   Florian Buff  Attending Physician for the [facility] 11/26/2021 12:11 PM

## 2021-11-30 ENCOUNTER — Ambulatory Visit (INDEPENDENT_AMBULATORY_CARE_PROVIDER_SITE_OTHER): Payer: Medicaid Other | Admitting: *Deleted

## 2021-11-30 VITALS — BP 121/85 | HR 96 | Wt 315.4 lb

## 2021-11-30 DIAGNOSIS — O288 Other abnormal findings on antenatal screening of mother: Secondary | ICD-10-CM

## 2021-11-30 DIAGNOSIS — O24415 Gestational diabetes mellitus in pregnancy, controlled by oral hypoglycemic drugs: Secondary | ICD-10-CM | POA: Diagnosis not present

## 2021-11-30 DIAGNOSIS — I10 Essential (primary) hypertension: Secondary | ICD-10-CM

## 2021-11-30 DIAGNOSIS — Z331 Pregnant state, incidental: Secondary | ICD-10-CM

## 2021-11-30 DIAGNOSIS — Z1389 Encounter for screening for other disorder: Secondary | ICD-10-CM

## 2021-11-30 DIAGNOSIS — O0993 Supervision of high risk pregnancy, unspecified, third trimester: Secondary | ICD-10-CM

## 2021-11-30 LAB — POCT URINALYSIS DIPSTICK OB
Blood, UA: NEGATIVE
Glucose, UA: NEGATIVE
Ketones, UA: NEGATIVE
Nitrite, UA: NEGATIVE

## 2021-11-30 NOTE — Progress Notes (Signed)
   NURSE VISIT- NST  SUBJECTIVE:  Cheyenne Moore is a 31 y.o. G65P1011 female at [redacted]w[redacted]d, here for a NST for pregnancy complicated by [facility] and A2DM currently on Metformin .  She reports active fetal movement, contractions: none, vaginal bleeding: none, membranes: intact.   OBJECTIVE:  BP 121/85   Pulse 96   Wt (!) 315 lb 6.4 oz (143.1 kg)   LMP  (LMP Unknown)   BMI 50.91 kg/m   Appears well, no apparent distress  Results for orders placed or performed in visit on 11/30/21 (from the past 24 hour(s))  POC Urinalysis Dipstick OB   Collection Time: 11/30/21 10:51 AM  Result Value Ref Range   Color, UA     Clarity, UA     Glucose, UA Negative Negative   Bilirubin, UA     Ketones, UA neg    Spec Grav, UA     Blood, UA neg    pH, UA     POC,PROTEIN,UA Large (3+) Negative, Trace, Small (1+), Moderate (2+), Large (3+), 4+   Urobilinogen, UA     Nitrite, UA neg    Leukocytes, UA Small (1+) (A) Negative   Appearance     Odor      NST: FHR baseline 140 bpm, Variability: moderate, Accelerations:present, Decelerations:  Absent= Cat 1/reactive Toco: none   ASSESSMENT: G3P1011 at [redacted]w[redacted]d with CHTN and A2DM currently on Metformin NST reactive  PLAN: EFM strip reviewed by Dr. Despina Hidden   Recommendations: keep next appointment as scheduled    Debbe Odea Sarahbeth Cashin  11/30/2021 11:11 AM

## 2021-12-02 ENCOUNTER — Encounter: Payer: Self-pay | Admitting: Obstetrics & Gynecology

## 2021-12-02 ENCOUNTER — Other Ambulatory Visit: Payer: Self-pay | Admitting: Obstetrics & Gynecology

## 2021-12-02 DIAGNOSIS — O10919 Unspecified pre-existing hypertension complicating pregnancy, unspecified trimester: Secondary | ICD-10-CM

## 2021-12-02 DIAGNOSIS — O24419 Gestational diabetes mellitus in pregnancy, unspecified control: Secondary | ICD-10-CM

## 2021-12-03 ENCOUNTER — Ambulatory Visit (INDEPENDENT_AMBULATORY_CARE_PROVIDER_SITE_OTHER): Payer: Medicaid Other

## 2021-12-03 ENCOUNTER — Other Ambulatory Visit: Payer: Self-pay | Admitting: Obstetrics & Gynecology

## 2021-12-03 ENCOUNTER — Ambulatory Visit (INDEPENDENT_AMBULATORY_CARE_PROVIDER_SITE_OTHER): Payer: Medicaid Other | Admitting: Obstetrics & Gynecology

## 2021-12-03 ENCOUNTER — Other Ambulatory Visit (HOSPITAL_COMMUNITY)
Admission: RE | Admit: 2021-12-03 | Discharge: 2021-12-03 | Disposition: A | Discharge status: Home / Self Care | Payer: Medicaid Other | Source: Ambulatory Visit | Attending: Obstetrics & Gynecology | Admitting: Obstetrics & Gynecology

## 2021-12-03 ENCOUNTER — Encounter: Payer: Self-pay | Admitting: Obstetrics & Gynecology

## 2021-12-03 VITALS — BP 125/88 | HR 94 | Wt 318.8 lb

## 2021-12-03 DIAGNOSIS — O24419 Gestational diabetes mellitus in pregnancy, unspecified control: Secondary | ICD-10-CM

## 2021-12-03 DIAGNOSIS — O10919 Unspecified pre-existing hypertension complicating pregnancy, unspecified trimester: Secondary | ICD-10-CM

## 2021-12-03 DIAGNOSIS — O0993 Supervision of high risk pregnancy, unspecified, third trimester: Secondary | ICD-10-CM

## 2021-12-03 DIAGNOSIS — O24415 Gestational diabetes mellitus in pregnancy, controlled by oral hypoglycemic drugs: Secondary | ICD-10-CM

## 2021-12-03 DIAGNOSIS — Z3A35 35 weeks gestation of pregnancy: Secondary | ICD-10-CM | POA: Insufficient documentation

## 2021-12-03 DIAGNOSIS — O320XX0 Maternal care for unstable lie, not applicable or unspecified: Secondary | ICD-10-CM

## 2021-12-03 DIAGNOSIS — I10 Essential (primary) hypertension: Secondary | ICD-10-CM

## 2021-12-03 NOTE — Progress Notes (Signed)
HIGH-RISK PREGNANCY VISIT Patient name: Cheyenne Moore MRN [id number]  Date of birth: 1990-10-21 Chief Complaint:   Routine Prenatal Visit  History of Present Illness:   Cheyenne Moore is a 31 y.o. G54P1011 female at 90w2dwith an Estimated Date of Delivery: 01/05/22 being seen today for ongoing management of a high-risk pregnancy complicated by:  -Chronic HTN-  Since adding procardia bid, which has helped improved her blood pressure -occasional HA- sometimes associated with elevated BP 150/90s  -GDMA2 with LGA Pt did not have logs with her today Mornings starting to be elevated again- low 100s After meals within normal range 120-130s  -Fetal malpresentation -morbid obesity -hypothyroidism   Today she reports no complaints.   Contractions: Not present.  .  Movement: Present. denies leaking of fluid.      07/02/2021    9:00 AM 02/02/2021    2:57 PM 07/24/2019    9:08 AM 04/11/2019    9:08 AM 05/03/2018   10:17 AM  Depression screen PHQ 2/9  Decreased Interest 0 0 0 0 0  Down, Depressed, Hopeless 0 0 0 0 0  PHQ - 2 Score 0 0 0 0 0  Altered sleeping 0 0 0 0   Tired, decreased energy _0 0   Change in appetite 0 1 0 0   Feeling bad or failure about yourself  0 0 0 0   Trouble concentrating 0 0 0 0   Moving slowly or fidgety/restless 0 0 0 0   Suicidal thoughts 0 0 0 0   PHQ-9 Score _1 0      Current Outpatient Medications  Medication Instructions   Accu-Chek Softclix Lancets lancets Use as instructed to check blood sugar 4 times daily   Accu-Chek Softclix Lancets lancets Use as instructed to check blood sugar 4 times daily   acetaminophen (TYLENOL) 650 mg, Oral, Every 6 hours PRN   amLODipine (NORVASC) 10 MG tablet TAKE 1 TABLET(10 MG) BY MOUTH DAILY   ARMOUR THYROID 90 MG tablet TAKE 1 TABLET BY MOUTH DAILY BEFORE BREAKFAST   aspirin 162 mg, Oral, Daily   Blood Glucose Monitoring Suppl (ACCU-CHEK GUIDE ME) w/Device KIT 1 each, Does not apply, 4 times daily    cholecalciferol (VITAMIN D) 10,000 Units, Oral, Daily   dextromethorphan-guaiFENesin (MUCINEX DM) 30-600 MG 12hr tablet 1 tablet, 2 times daily   glucose blood (ACCU-CHEK GUIDE) test strip Use as instructed to check blood sugar 4 times daily   guaifenesin (ROBITUSSIN) 200 mg, 3 times daily PRN   Loratadine (CLARITIN PO) Oral   metFORMIN (GLUCOPHAGE) 500 MG tablet TAKE 1 TABLET BY MOUTH IN THE MORNING WITH BREAKFAST, THEN TAKE 2 TABLETS AT BEDTIME   NIFEdipine (PROCARDIA XL) 30 mg, Oral, 2 times daily   omeprazole (PRILOSEC) 20 MG capsule TAKE 1 CAPSULE(20 MG) BY MOUTH DAILY   Prenatal Vit-Fe Fumarate-FA (PRENATAL MULTIVITAMIN) TABS tablet 1 tablet, Oral, Daily     Review of Systems:   Pertinent items are noted in HPI Denies abnormal vaginal discharge w/ itching/odor/irritation, headaches, visual changes, shortness of breath, chest pain, abdominal pain, severe nausea/vomiting, or problems with urination or bowel movements unless otherwise stated above. Pertinent History Reviewed:  Reviewed past medical,surgical, social, obstetrical and family history.  Reviewed problem list, medications and allergies. Physical Assessment:   Vitals:   12/03/21 1214  BP: 125/88  Pulse: 94  Weight: (!) 318 lb 12.8 oz (144.6 kg)  Body mass index is 51.46 kg/m.  Physical Examination:   General appearance: alert, well appearing, and in no distress  Mental status: normal mood, behavior, speech, dress, motor activity, and thought processes  Skin: warm & dry   Extremities: Edema: Trace    Cardiovascular: normal heart rate noted  Respiratory: normal respiratory effort, no distress  Abdomen: gravid, soft, non-tender  Pelvic:  vaginal swabs obtained          Fetal Status:     Movement: Present    Fetal Surveillance Testing today: frank breech,BPP 8/8,RI .60,.61,.66,.51=63%,EFW 3561 g 99%,AFI 13.5 cm,posterior fundal placenta gr 3    Chaperone:  pt declined     No results found for this or any  previous visit (from the past 24 hour(s)).   Assessment & Plan:  High-risk pregnancy: G3P1011 at 65w2dwith an Estimated Date of Delivery: 01/05/22   -Chronic HTN-  Continue with current medication, Amlodipine and procardia 364mbid Reviewed preeclampsia precautions BPP 8/8 today, continue twice weekly testing Reviewed delivery @ 37wks  -GDMA2 with LGA No change with medication currently, encouraged pt to bring logs  -Fetal malpresentation Discussed plan for either ECV or primary C-section Reviewed risk/benefit of each option Desires to discuss with her husband, leaning towards ECV Scheduled for next Tuesday  -morbid obesity -hypothyroidism   Meds: No orders of the defined types were placed in this encounter.   Labs/procedures today: vaginal cultures obtained, BPP/growth  Treatment Plan:  as outlined above  Reviewed: Preterm labor symptoms and general obstetric precautions including but not limited to vaginal bleeding, contractions, leaking of fluid and fetal movement were reviewed in detail with the patient.  All questions were answered. Pt has home bp cuff. Check bp weekly, let usKoreanow if >140/90.   Follow-up: Return for as scheduled- twice weekly.   Future Appointments  Date Time Provider [place]11/21/2023 10:50 AM [facility] NURSE [facility]  12/14/2021 10:10 AM [facility] NURSE [facility]  12/17/2021  8:30 AM [facility] - FTO[facility]YN [place] None  12/17/2021 10:10 AM OzJanyth PupaDO [facility]-FT FTO[facility]YN  12/21/2021 10:10 AM [facility] NURSE [facility]  12/24/2021  8:30 AM [facility] - FTO[facility]YN [place]FT[facility] None  12/24/2021  9:30 AM EuFlorian BuffMD [facility]-FT FTOBGYN  12/28/2021 10:10 AM [facility] NURSE [facility]  12/31/2021  9:15 AM [facility] - FTOBGYN [place]FTIMG None  12/31/2021 10:10 AM OzJanyth PupaDO [facility]FT FTO[facility]  01/04/2022 10:30 AM [facility] NURSE [facility]    Orders Placed This Encounter  Procedures   Culture, beta strep (group b  only)    JeJanyth PupaDO Attending [place]Fa[place] [facility] [facility]

## 2021-12-03 NOTE — Progress Notes (Signed)
[place] 35+2 wks,frank breech,BPP 8/8,RI .60,.61,.66,.51=63%,EFW 3561 g 99%,AFI 13.5 cm,posterior fundal placenta gr 3

## 2021-12-06 ENCOUNTER — Encounter: Payer: Self-pay | Admitting: Obstetrics & Gynecology

## 2021-12-06 ENCOUNTER — Telehealth (HOSPITAL_COMMUNITY): Payer: Self-pay | Admitting: *Deleted

## 2021-12-06 LAB — CERVICOVAGINAL ANCILLARY ONLY
Chlamydia: NEGATIVE
Comment: NEGATIVE
Comment: NORMAL
Neisseria Gonorrhea: NEGATIVE

## 2021-12-06 NOTE — Telephone Encounter (Signed)
Called patient back.  At this time, plan to hold off on ECV.  Hopeful that this baby will turn on its own like her first pregnancy.  Plan for follow up US as scheduled 12/1- if still breech re-evaluate management plan.  Myna Hidalgo, DO Attending Obstetrician & Gynecologist, [facility] for [employer], [facility]

## 2021-12-06 NOTE — Telephone Encounter (Signed)
Preadmission screen. Pt states she has decided to wait until the next ultrasound before proceding with the version.  Instructed to notify MD office of change in plans

## 2021-12-07 ENCOUNTER — Observation Stay (HOSPITAL_COMMUNITY): Admission: RE | Admit: 2021-12-07 | Payer: Medicaid Other | Source: Ambulatory Visit | Admitting: Family Medicine

## 2021-12-07 ENCOUNTER — Ambulatory Visit (INDEPENDENT_AMBULATORY_CARE_PROVIDER_SITE_OTHER): Payer: Medicaid Other | Admitting: *Deleted

## 2021-12-07 ENCOUNTER — Observation Stay (HOSPITAL_COMMUNITY): Admission: RE | Admit: 2021-12-07 | Payer: Medicaid Other | Source: Home / Self Care | Admitting: Family Medicine

## 2021-12-07 VITALS — BP 120/79 | HR 98 | Wt 320.2 lb

## 2021-12-07 DIAGNOSIS — O288 Other abnormal findings on antenatal screening of mother: Secondary | ICD-10-CM

## 2021-12-07 DIAGNOSIS — O0993 Supervision of high risk pregnancy, unspecified, third trimester: Secondary | ICD-10-CM

## 2021-12-07 DIAGNOSIS — O24415 Gestational diabetes mellitus in pregnancy, controlled by oral hypoglycemic drugs: Secondary | ICD-10-CM

## 2021-12-07 DIAGNOSIS — I10 Essential (primary) hypertension: Secondary | ICD-10-CM | POA: Diagnosis not present

## 2021-12-07 DIAGNOSIS — Z331 Pregnant state, incidental: Secondary | ICD-10-CM

## 2021-12-07 DIAGNOSIS — Z1389 Encounter for screening for other disorder: Secondary | ICD-10-CM

## 2021-12-07 LAB — POCT URINALYSIS DIPSTICK OB
Blood, UA: NEGATIVE
Glucose, UA: NEGATIVE
Ketones, UA: 80
Nitrite, UA: NEGATIVE

## 2021-12-07 LAB — CULTURE, BETA STREP (GROUP B ONLY): Strep Gp B Culture: NEGATIVE

## 2021-12-07 NOTE — Progress Notes (Signed)
   NURSE VISIT- NST  SUBJECTIVE:  Cheyenne Moore is a 31 y.o. G44P1011 female at [redacted]w[redacted]d, here for a NST for pregnancy complicated by [facility] and Diabetes: A2DM} on Metformin.  She reports active fetal movement, contractions: none, vaginal bleeding: none, membranes: intact.   OBJECTIVE:  BP 120/79   Pulse 98   Wt (!) 320 lb 3.2 oz (145.2 kg)   LMP  (LMP Unknown)   BMI 51.68 kg/m   Appears well, no apparent distress  Results for orders placed or performed in visit on 12/07/21 (from the past 24 hour(s))  POC Urinalysis Dipstick OB   Collection Time: 12/07/21 11:20 AM  Result Value Ref Range   Color, UA     Clarity, UA     Glucose, UA Negative Negative   Bilirubin, UA     Ketones, UA 80    Spec Grav, UA     Blood, UA neg    pH, UA     POC,PROTEIN,UA Trace Negative, Trace, Small (1+), Moderate (2+), Large (3+), 4+   Urobilinogen, UA     Nitrite, UA neg    Leukocytes, UA Small (1+) (A) Negative   Appearance     Odor      NST: FHR baseline 135 bpm, Variability: moderate, Accelerations:present, Decelerations:  Absent= Cat 1/reactive Toco: none   ASSESSMENT: G3P1011 at [redacted]w[redacted]d with CHTN and Diabetes: A2DM} on Metformin NST reactive  PLAN: EFM strip reviewed by Dr. Charlotta Newton   Recommendations: keep next appointment as scheduled    Jobe Marker  12/07/2021 11:48 AM

## 2021-12-14 ENCOUNTER — Ambulatory Visit (INDEPENDENT_AMBULATORY_CARE_PROVIDER_SITE_OTHER): Payer: Medicaid Other | Admitting: *Deleted

## 2021-12-14 ENCOUNTER — Encounter: Payer: Medicaid Other | Admitting: Obstetrics & Gynecology

## 2021-12-14 VITALS — BP 124/83 | HR 92 | Wt 318.8 lb

## 2021-12-14 DIAGNOSIS — O288 Other abnormal findings on antenatal screening of mother: Secondary | ICD-10-CM

## 2021-12-14 DIAGNOSIS — O24415 Gestational diabetes mellitus in pregnancy, controlled by oral hypoglycemic drugs: Secondary | ICD-10-CM | POA: Diagnosis not present

## 2021-12-14 DIAGNOSIS — I10 Essential (primary) hypertension: Secondary | ICD-10-CM

## 2021-12-14 DIAGNOSIS — Z1389 Encounter for screening for other disorder: Secondary | ICD-10-CM

## 2021-12-14 DIAGNOSIS — O0993 Supervision of high risk pregnancy, unspecified, third trimester: Secondary | ICD-10-CM | POA: Diagnosis not present

## 2021-12-14 DIAGNOSIS — Z331 Pregnant state, incidental: Secondary | ICD-10-CM

## 2021-12-14 LAB — POCT URINALYSIS DIPSTICK OB
Blood, UA: NEGATIVE
Glucose, UA: NEGATIVE
Ketones, UA: NEGATIVE
Nitrite, UA: NEGATIVE

## 2021-12-14 NOTE — Progress Notes (Signed)
   NURSE VISIT- NST  SUBJECTIVE:  Cheyenne Moore is a 31 y.o. G36P1011 female at [redacted]w[redacted]d, here for a NST for pregnancy complicated by [facility] and Diabetes: A2DM} on Metformin.  She reports active fetal movement, contractions: UI, vaginal bleeding: none, membranes: intact.   OBJECTIVE:  BP 124/83   Pulse 92   Wt (!) 318 lb 12.8 oz (144.6 kg)   LMP  (LMP Unknown)   BMI 51.46 kg/m   Appears well, no apparent distress  Results for orders placed or performed in visit on 12/14/21 (from the past 24 hour(s))  POC Urinalysis Dipstick OB   Collection Time: 12/14/21 10:45 AM  Result Value Ref Range   Color, UA     Clarity, UA     Glucose, UA Negative Negative   Bilirubin, UA     Ketones, UA negative    Spec Grav, UA     Blood, UA negative    pH, UA     POC,PROTEIN,UA Small (1+) Negative, Trace, Small (1+), Moderate (2+), Large (3+), 4+   Urobilinogen, UA     Nitrite, UA negative    Leukocytes, UA Trace (A) Negative   Appearance     Odor      NST: FHR baseline 135 bpm, Variability: moderate, Accelerations:present, Decelerations:  Absent= Cat 1/reactive Toco: UI   ASSESSMENT: G3P1011 at [redacted]w[redacted]d with CHTN and Diabetes: A2DM}on Metformin NST reactive  PLAN: EFM strip reviewed by Dr. Despina Hidden   Recommendations: keep next appointment as scheduled    Jobe Marker  12/14/2021 11:20 AM

## 2021-12-16 ENCOUNTER — Other Ambulatory Visit: Payer: Self-pay | Admitting: Obstetrics & Gynecology

## 2021-12-16 DIAGNOSIS — O24419 Gestational diabetes mellitus in pregnancy, unspecified control: Secondary | ICD-10-CM

## 2021-12-16 DIAGNOSIS — O10919 Unspecified pre-existing hypertension complicating pregnancy, unspecified trimester: Secondary | ICD-10-CM

## 2021-12-17 ENCOUNTER — Ambulatory Visit (INDEPENDENT_AMBULATORY_CARE_PROVIDER_SITE_OTHER): Payer: Medicaid Other

## 2021-12-17 ENCOUNTER — Encounter: Payer: Self-pay | Admitting: Obstetrics & Gynecology

## 2021-12-17 ENCOUNTER — Ambulatory Visit (INDEPENDENT_AMBULATORY_CARE_PROVIDER_SITE_OTHER): Payer: Medicaid Other | Admitting: Obstetrics & Gynecology

## 2021-12-17 VITALS — BP 126/92 | HR 108 | Wt 317.2 lb

## 2021-12-17 DIAGNOSIS — O24419 Gestational diabetes mellitus in pregnancy, unspecified control: Secondary | ICD-10-CM | POA: Diagnosis not present

## 2021-12-17 DIAGNOSIS — O10919 Unspecified pre-existing hypertension complicating pregnancy, unspecified trimester: Secondary | ICD-10-CM

## 2021-12-17 DIAGNOSIS — O0993 Supervision of high risk pregnancy, unspecified, third trimester: Secondary | ICD-10-CM

## 2021-12-17 DIAGNOSIS — Z3A37 37 weeks gestation of pregnancy: Secondary | ICD-10-CM

## 2021-12-17 DIAGNOSIS — I10 Essential (primary) hypertension: Secondary | ICD-10-CM

## 2021-12-17 LAB — POCT URINALYSIS DIPSTICK OB
Blood, UA: NEGATIVE
Glucose, UA: NEGATIVE
Nitrite, UA: NEGATIVE

## 2021-12-17 NOTE — Progress Notes (Signed)
 HIGH-RISK PREGNANCY VISIT Patient name: Cheyenne Moore MRN [id number]  Date of birth: 10/20/1990 Chief Complaint:   Routine Prenatal Visit  History of Present Illness:   Cheyenne Moore is a 31 y.o. G3P1011 female at [redacted]w[redacted]d with an Estimated Date of Delivery: 01/05/22 being seen today for ongoing management of a high-risk pregnancy complicated by:  -Chronic HTN Norvasc 10mg daily Procardia XL 30mg bid Per pt BPs at home 120-130/80s, remains asymptomatic  -hypothyroidism -obesity -GDMA2 with suspected LGA On metformin 500mg am, 1000mg pm Did not have log  Today she reports no complaints, starting to feel pelvic pressure.  Contractions: Irritability. Vag. Bleeding: None.  Movement: Present. denies leaking of fluid.      07/02/2021    9:00 AM 02/02/2021    2:57 PM 07/24/2019    9:08 AM 04/11/2019    9:08 AM 05/03/2018   10:17 AM  Depression screen PHQ 2/9  Decreased Interest 0 0 0 0 0  Down, Depressed, Hopeless 0 0 0 0 0  PHQ - 2 Score 0 0 0 0 0  Altered sleeping 0 0 0 0   Tired, decreased energy 2 1 1 0   Change in appetite 0 1 0 0   Feeling bad or failure about yourself  0 0 0 0   Trouble concentrating 0 0 0 0   Moving slowly or fidgety/restless 0 0 0 0   Suicidal thoughts 0 0 0 0   PHQ-9 Score 2 2 1 0      Current Outpatient Medications  Medication Instructions   Accu-Chek Softclix Lancets lancets Use as instructed to check blood sugar 4 times daily   Accu-Chek Softclix Lancets lancets Use as instructed to check blood sugar 4 times daily   acetaminophen (TYLENOL) 650 mg, Oral, Every 6 hours PRN   amLODipine (NORVASC) 10 MG tablet TAKE 1 TABLET(10 MG) BY MOUTH DAILY   ARMOUR THYROID 90 MG tablet TAKE 1 TABLET BY MOUTH DAILY BEFORE BREAKFAST   aspirin 162 mg, Oral, Daily   Blood Glucose Monitoring Suppl (ACCU-CHEK GUIDE ME) w/Device KIT 1 each, Does not apply, 4 times daily   cholecalciferol (VITAMIN D) 10,000 Units, Oral, Daily   dextromethorphan-guaiFENesin  (MUCINEX DM) 30-600 MG 12hr tablet 1 tablet, 2 times daily   glucose blood (ACCU-CHEK GUIDE) test strip Use as instructed to check blood sugar 4 times daily   guaifenesin (ROBITUSSIN) 200 mg, 3 times daily PRN   Loratadine (CLARITIN PO) Oral   metFORMIN (GLUCOPHAGE) 500 MG tablet TAKE 1 TABLET BY MOUTH IN THE MORNING WITH BREAKFAST, THEN TAKE 2 TABLETS AT BEDTIME   NIFEdipine (PROCARDIA XL) 30 mg, Oral, 2 times daily   omeprazole (PRILOSEC) 20 MG capsule TAKE 1 CAPSULE(20 MG) BY MOUTH DAILY   Prenatal Vit-Fe Fumarate-FA (PRENATAL MULTIVITAMIN) TABS tablet 1 tablet, Oral, Daily     Review of Systems:   Pertinent items are noted in HPI Denies abnormal vaginal discharge w/ itching/odor/irritation, headaches, visual changes, shortness of breath, chest pain, abdominal pain, severe nausea/vomiting, or problems with urination or bowel movements unless otherwise stated above. Pertinent History Reviewed:  Reviewed past medical,surgical, social, obstetrical and family history.  Reviewed problem list, medications and allergies. Physical Assessment:   Vitals:   12/17/21 0920 12/17/21 0923  BP: (!) 153/101 (!) 126/92  Pulse: 94 (!) 108  Weight: (!) 317 lb 4 oz (143.9 kg)   Body mass index is 51.21 kg/m.           Physical Examination:     General appearance: alert, well appearing, and in no distress  Mental status: normal mood, behavior, speech, dress, motor activity, and thought processes  Skin: warm & dry   Extremities: Edema: None    Cardiovascular: normal heart rate noted  Respiratory: normal respiratory effort, no distress  Abdomen: gravid, soft, non-tender  Pelvic: Cervical exam performed  Dilation: Closed Effacement (%): Thick Station: -3  Fetal Status:     Movement: Present    Fetal Surveillance Testing today: ,cephalic,FHR 148 bpm,posterior placenta gr 3,AFI 12.7 cm,RI .57,.54,.47,.52=29%,BPP 8/8    Chaperone:  pt declined     Results for orders placed or performed in visit on  12/17/21 (from the past 24 hour(s))  POC Urinalysis Dipstick OB   Collection Time: 12/17/21  9:23 AM  Result Value Ref Range   Color, UA     Clarity, UA     Glucose, UA Negative Negative   Bilirubin, UA     Ketones, UA Trace    Spec Grav, UA     Blood, UA negative    pH, UA     POC,PROTEIN,UA Small (1+) Negative, Trace, Small (1+), Moderate (2+), Large (3+), 4+   Urobilinogen, UA     Nitrite, UA negative    Leukocytes, UA Trace (A) Negative   Appearance     Odor       Assessment & Plan:  High-risk pregnancy: G3P1011 at [redacted]w[redacted]d with an Estimated Date of Delivery: 01/05/22   1) Chronic HTN -continue with current medication -reviewed preeclampsia precautions  2) GDMA2 with macrosomia -reviewd today' US, BPP 8/8 -ideally IOL scheduled for this week, next available next Sunday 12/10  Meds: No orders of the defined types were placed in this encounter.   Labs/procedures today: BPP  Treatment Plan:  as outlined above  Reviewed: Term labor symptoms and general obstetric precautions including but not limited to vaginal bleeding, contractions, leaking of fluid and fetal movement were reviewed in detail with the patient.  All questions were answered. Pt has home bp cuff. Check bp weekly, let us know if >140/90.   Follow-up: as scheduled   Future Appointments  Date Time Provider Department Center  12/21/2021 10:10 AM [facility] NURSE [facility]  12/24/2021  8:30 AM [facility] US [facility] None  12/24/2021  9:30 AM Eure, Luther H, MD [facility]  12/26/2021  7:15 AM [facility] SCHED ROOM [facility] None  12/28/2021 10:10 AM [facility] NURSE [facility]  12/31/2021  9:15 AM [facility] US [facility] None  12/31/2021 10:10 AM Ozan, Jennifer, DO [facility]  01/04/2022 10:30 AM [facility] NURSE [facility]    Orders Placed This Encounter  Procedures   POC Urinalysis Dipstick OB    Jennifer Ozan, DO Attending Obstetrician & Gynecologist, [facility], [facility]    

## 2021-12-17 NOTE — Progress Notes (Signed)
[place] 37+2 wks,cephalic,FHR 148 bpm,posterior placenta gr 3,AFI 12.7 cm,RI .57,.54,.47,.52=29%,BPP 8/8

## 2021-12-20 ENCOUNTER — Encounter (HOSPITAL_COMMUNITY): Payer: Self-pay | Admitting: *Deleted

## 2021-12-20 ENCOUNTER — Telehealth (HOSPITAL_COMMUNITY): Payer: Self-pay | Admitting: *Deleted

## 2021-12-20 NOTE — Telephone Encounter (Signed)
Preadmission screen  

## 2021-12-21 ENCOUNTER — Ambulatory Visit (INDEPENDENT_AMBULATORY_CARE_PROVIDER_SITE_OTHER): Payer: Medicaid Other | Admitting: *Deleted

## 2021-12-21 VITALS — BP 133/92 | HR 91

## 2021-12-21 DIAGNOSIS — Z3A37 37 weeks gestation of pregnancy: Secondary | ICD-10-CM | POA: Diagnosis not present

## 2021-12-21 DIAGNOSIS — O0993 Supervision of high risk pregnancy, unspecified, third trimester: Secondary | ICD-10-CM | POA: Diagnosis not present

## 2021-12-21 DIAGNOSIS — I10 Essential (primary) hypertension: Secondary | ICD-10-CM | POA: Diagnosis not present

## 2021-12-21 DIAGNOSIS — O24415 Gestational diabetes mellitus in pregnancy, controlled by oral hypoglycemic drugs: Secondary | ICD-10-CM

## 2021-12-21 NOTE — Progress Notes (Signed)
   NURSE VISIT- NST  SUBJECTIVE:  Cheyenne Moore is a 31 y.o. G31P1011 female at [redacted]w[redacted]d, here for a NST for pregnancy complicated by [facility] and Diabetes: A2DM}.  She reports active fetal movement, contractions: none, vaginal bleeding: none, membranes: intact.   OBJECTIVE:  BP (!) 133/92   Pulse 91   LMP  (LMP Unknown)   Appears well, no apparent distress  No results found for this or any previous visit (from the past 24 hour(s)).  NST: FHR baseline 150 bpm, Variability: moderate, Accelerations:present, Decelerations:  Absent= Cat 1/reactive Toco: none   ASSESSMENT: G3P1011 at [redacted]w[redacted]d with CHTN and Diabetes: A2DM} NST reactive  PLAN: EFM strip reviewed by Dr. Despina Hidden   Recommendations: keep next appointment as scheduled    Jobe Marker  12/21/2021 11:25 AM

## 2021-12-22 ENCOUNTER — Other Ambulatory Visit: Payer: Self-pay | Admitting: Obstetrics & Gynecology

## 2021-12-22 ENCOUNTER — Other Ambulatory Visit: Payer: Self-pay | Admitting: Advanced Practice Midwife

## 2021-12-22 DIAGNOSIS — O10919 Unspecified pre-existing hypertension complicating pregnancy, unspecified trimester: Secondary | ICD-10-CM

## 2021-12-23 ENCOUNTER — Other Ambulatory Visit: Payer: Self-pay | Admitting: Obstetrics & Gynecology

## 2021-12-23 DIAGNOSIS — O10919 Unspecified pre-existing hypertension complicating pregnancy, unspecified trimester: Secondary | ICD-10-CM

## 2021-12-24 ENCOUNTER — Encounter: Payer: Self-pay | Admitting: Obstetrics & Gynecology

## 2021-12-24 ENCOUNTER — Ambulatory Visit (INDEPENDENT_AMBULATORY_CARE_PROVIDER_SITE_OTHER): Payer: Medicaid Other

## 2021-12-24 ENCOUNTER — Encounter (HOSPITAL_COMMUNITY): Payer: Self-pay | Admitting: Obstetrics & Gynecology

## 2021-12-24 ENCOUNTER — Other Ambulatory Visit: Payer: Self-pay

## 2021-12-24 ENCOUNTER — Ambulatory Visit (INDEPENDENT_AMBULATORY_CARE_PROVIDER_SITE_OTHER): Payer: Medicaid Other | Admitting: Obstetrics & Gynecology

## 2021-12-24 ENCOUNTER — Inpatient Hospital Stay (HOSPITAL_COMMUNITY)
Admission: AD | Admit: 2021-12-24 | Discharge: 2021-12-28 | DRG: 806 | Disposition: A | Discharge status: Home / Self Care | Payer: Medicaid Other | Attending: Obstetrics & Gynecology | Admitting: Obstetrics & Gynecology

## 2021-12-24 VITALS — BP 142/102 | HR 94 | Wt 324.0 lb

## 2021-12-24 DIAGNOSIS — I10 Essential (primary) hypertension: Secondary | ICD-10-CM | POA: Diagnosis present

## 2021-12-24 DIAGNOSIS — Z3A38 38 weeks gestation of pregnancy: Secondary | ICD-10-CM

## 2021-12-24 DIAGNOSIS — E039 Hypothyroidism, unspecified: Secondary | ICD-10-CM | POA: Diagnosis present

## 2021-12-24 DIAGNOSIS — O3663X0 Maternal care for excessive fetal growth, third trimester, not applicable or unspecified: Secondary | ICD-10-CM | POA: Diagnosis present

## 2021-12-24 DIAGNOSIS — O24415 Gestational diabetes mellitus in pregnancy, controlled by oral hypoglycemic drugs: Secondary | ICD-10-CM

## 2021-12-24 DIAGNOSIS — O99214 Obesity complicating childbirth: Secondary | ICD-10-CM | POA: Diagnosis not present

## 2021-12-24 DIAGNOSIS — O24419 Gestational diabetes mellitus in pregnancy, unspecified control: Secondary | ICD-10-CM | POA: Diagnosis present

## 2021-12-24 DIAGNOSIS — O24420 Gestational diabetes mellitus in childbirth, diet controlled: Secondary | ICD-10-CM | POA: Diagnosis present

## 2021-12-24 DIAGNOSIS — O164 Unspecified maternal hypertension, complicating childbirth: Secondary | ICD-10-CM | POA: Diagnosis not present

## 2021-12-24 DIAGNOSIS — O1002 Pre-existing essential hypertension complicating childbirth: Principal | ICD-10-CM | POA: Diagnosis present

## 2021-12-24 DIAGNOSIS — O9081 Anemia of the puerperium: Secondary | ICD-10-CM | POA: Diagnosis not present

## 2021-12-24 DIAGNOSIS — O0993 Supervision of high risk pregnancy, unspecified, third trimester: Secondary | ICD-10-CM

## 2021-12-24 DIAGNOSIS — O10919 Unspecified pre-existing hypertension complicating pregnancy, unspecified trimester: Principal | ICD-10-CM | POA: Diagnosis present

## 2021-12-24 DIAGNOSIS — O0990 Supervision of high risk pregnancy, unspecified, unspecified trimester: Secondary | ICD-10-CM

## 2021-12-24 DIAGNOSIS — E669 Obesity, unspecified: Secondary | ICD-10-CM | POA: Diagnosis not present

## 2021-12-24 DIAGNOSIS — O99284 Endocrine, nutritional and metabolic diseases complicating childbirth: Secondary | ICD-10-CM | POA: Diagnosis present

## 2021-12-24 DIAGNOSIS — E876 Hypokalemia: Secondary | ICD-10-CM | POA: Diagnosis present

## 2021-12-24 DIAGNOSIS — O24424 Gestational diabetes mellitus in childbirth, insulin controlled: Secondary | ICD-10-CM | POA: Diagnosis not present

## 2021-12-24 DIAGNOSIS — D62 Acute posthemorrhagic anemia: Secondary | ICD-10-CM | POA: Diagnosis not present

## 2021-12-24 DIAGNOSIS — O2412 Pre-existing diabetes mellitus, type 2, in childbirth: Secondary | ICD-10-CM | POA: Diagnosis not present

## 2021-12-24 LAB — COMPREHENSIVE METABOLIC PANEL
ALT: 14 U/L (ref 0–44)
AST: 22 U/L (ref 15–41)
Albumin: 2.6 g/dL — ABNORMAL LOW (ref 3.5–5.0)
Alkaline Phosphatase: 118 U/L (ref 38–126)
Anion gap: 10 (ref 5–15)
BUN: <5 mg/dL — ABNORMAL LOW (ref 6–20)
CO2: 19 mmol/L — ABNORMAL LOW (ref 22–32)
Calcium: 9.2 mg/dL (ref 8.9–10.3)
Chloride: 107 mmol/L (ref 98–111)
Creatinine, Ser: 0.35 mg/dL — ABNORMAL LOW (ref 0.44–1.00)
GFR, Estimated: >60 mL/min (ref >60)
Glucose, Bld: 109 mg/dL — ABNORMAL HIGH (ref 70–99)
Potassium: 3.2 mmol/L — ABNORMAL LOW (ref 3.5–5.1)
Sodium: 136 mmol/L (ref 135–145)
Total Bilirubin: 0.5 mg/dL (ref 0.3–1.2)
Total Protein: 5.7 g/dL — ABNORMAL LOW (ref 6.5–8.1)

## 2021-12-24 LAB — POCT URINALYSIS DIPSTICK OB
Glucose, UA: NEGATIVE
Nitrite, UA: NEGATIVE
POC,PROTEIN,UA: NEGATIVE

## 2021-12-24 LAB — CBC
HCT: 33.6 % — ABNORMAL LOW (ref 36.0–46.0)
HCT: 33.5 % — ABNORMAL LOW (ref 36.0–46.0)
Hemoglobin: 11.7 g/dL — ABNORMAL LOW (ref 12.0–15.0)
Hemoglobin: 11.5 g/dL — ABNORMAL LOW (ref 12.0–15.0)
MCH: 29.6 pg (ref 26.0–34.0)
MCH: 29.3 pg (ref 26.0–34.0)
MCHC: 34.8 g/dL (ref 30.0–36.0)
MCHC: 34.3 g/dL (ref 30.0–36.0)
MCV: 85.1 fL (ref 80.0–100.0)
MCV: 85.2 fL (ref 80.0–100.0)
Platelets: 216 10*3/uL (ref 150–400)
Platelets: 235 10*3/uL (ref 150–400)
RBC: 3.95 MIL/uL (ref 3.87–5.11)
RBC: 3.93 MIL/uL (ref 3.87–5.11)
RDW: 14.8 % (ref 11.5–15.5)
RDW: 14.9 % (ref 11.5–15.5)
WBC: 12 10*3/uL — ABNORMAL HIGH (ref 4.0–10.5)
WBC: 10.9 10*3/uL — ABNORMAL HIGH (ref 4.0–10.5)
nRBC: 0 % (ref 0.0–0.2)
nRBC: 0 % (ref 0.0–0.2)

## 2021-12-24 LAB — GLUCOSE, CAPILLARY
Glucose-Capillary: 97 mg/dL (ref 70–99)
Glucose-Capillary: 151 mg/dL — ABNORMAL HIGH (ref 70–99)
Glucose-Capillary: 121 mg/dL — ABNORMAL HIGH (ref 70–99)

## 2021-12-24 LAB — PROTEIN / CREATININE RATIO, URINE
Creatinine, Urine: 114 mg/dL
Protein Creatinine Ratio: 0.1 mg/mg{Cre} (ref 0.00–0.15)
Total Protein, Urine: 11 mg/dL

## 2021-12-24 LAB — TYPE AND SCREEN
ABO/RH(D): O POS
Antibody Screen: NEGATIVE

## 2021-12-24 MED ORDER — OXYTOCIN-SODIUM CHLORIDE 30-0.9 UT/500ML-% IV SOLN
2.5000 [IU]/h | INTRAVENOUS | Status: DC
  Administered 2021-12-26: 2.5 [IU]/h via INTRAVENOUS
  Filled 2021-12-24 (×2): qty 500

## 2021-12-24 MED ORDER — ONDANSETRON HCL 4 MG/2ML IJ SOLN: 4.0000 mg | Freq: Four times a day (QID) | INTRAMUSCULAR | Status: DC | PRN

## 2021-12-24 MED ORDER — SOD CITRATE-CITRIC ACID 500-334 MG/5ML PO SOLN: 30.0000 mL | ORAL | Status: DC | PRN

## 2021-12-24 MED ORDER — OXYTOCIN BOLUS FROM INFUSION
333.0000 mL | Freq: Once | INTRAVENOUS | Status: AC
  Administered 2021-12-26: 333 mL via INTRAVENOUS

## 2021-12-24 MED ORDER — OXYTOCIN-SODIUM CHLORIDE 30-0.9 UT/500ML-% IV SOLN
1.0000 m[IU]/min | INTRAVENOUS | Status: DC
  Administered 2021-12-24: 2 m[IU]/min via INTRAVENOUS
  Administered 2021-12-25: 32 m[IU]/min via INTRAVENOUS
  Filled 2021-12-24: qty 500

## 2021-12-24 MED ORDER — LIDOCAINE HCL (PF) 1 % IJ SOLN: 30.0000 mL | INTRAMUSCULAR | Status: DC | PRN

## 2021-12-24 MED ORDER — OXYCODONE-ACETAMINOPHEN 5-325 MG PO TABS: 1.0000 | ORAL_TABLET | ORAL | Status: DC | PRN

## 2021-12-24 MED ORDER — MISOPROSTOL 50MCG HALF TABLET
50.0000 ug | ORAL_TABLET | Freq: Once | ORAL | Status: AC
  Administered 2021-12-24: 50 ug via ORAL
  Filled 2021-12-24: qty 1

## 2021-12-24 MED ORDER — LACTATED RINGERS IV SOLN: 500.0000 mL | INTRAVENOUS | Status: DC | PRN

## 2021-12-24 MED ORDER — EPHEDRINE 5 MG/ML INJ: 10.0000 mg | INTRAVENOUS | Status: DC | PRN

## 2021-12-24 MED ORDER — ACETAMINOPHEN 325 MG PO TABS
650.0000 mg | ORAL_TABLET | ORAL | Status: DC | PRN
  Administered 2021-12-26: 650 mg via ORAL
  Filled 2021-12-24: qty 2

## 2021-12-24 MED ORDER — FENTANYL-BUPIVACAINE-NACL 0.5-0.125-0.9 MG/250ML-% EP SOLN
12.0000 mL/h | EPIDURAL | Status: DC | PRN
  Administered 2021-12-25 – 2021-12-26 (×2): 12 mL/h via EPIDURAL
  Filled 2021-12-24 (×2): qty 250

## 2021-12-24 MED ORDER — DIPHENHYDRAMINE HCL 50 MG/ML IJ SOLN: 12.5000 mg | INTRAMUSCULAR | Status: DC | PRN

## 2021-12-24 MED ORDER — OXYCODONE-ACETAMINOPHEN 5-325 MG PO TABS: 2.0000 | ORAL_TABLET | ORAL | Status: DC | PRN

## 2021-12-24 MED ORDER — LACTATED RINGERS IV SOLN: 500.0000 mL | Freq: Once | INTRAVENOUS | Status: DC

## 2021-12-24 MED ORDER — LACTATED RINGERS IV SOLN: INTRAVENOUS | Status: DC

## 2021-12-24 MED ORDER — PHENYLEPHRINE 80 MCG/ML (10ML) SYRINGE FOR IV PUSH (FOR BLOOD PRESSURE SUPPORT): 80.0000 ug | PREFILLED_SYRINGE | INTRAVENOUS | Status: DC | PRN

## 2021-12-24 MED ORDER — TERBUTALINE SULFATE 1 MG/ML IJ SOLN: 0.2500 mg | Freq: Once | INTRAMUSCULAR | Status: DC | PRN

## 2021-12-24 MED ORDER — FENTANYL CITRATE (PF) 100 MCG/2ML IJ SOLN: 100.0000 ug | INTRAMUSCULAR | Status: DC | PRN

## 2021-12-24 MED ORDER — MISOPROSTOL 25 MCG QUARTER TABLET
25.0000 ug | ORAL_TABLET | Freq: Once | ORAL | Status: AC
  Administered 2021-12-24: 25 ug via VAGINAL
  Filled 2021-12-24: qty 1

## 2021-12-24 NOTE — Progress Notes (Signed)
[place] 38+2 wks,cephalic,RI .49,.51,.56=42%,BPP 8/8,AFI 9.7 cm,posterior placenta gr 3

## 2021-12-24 NOTE — H&P (Cosign Needed Addendum)
Cheyenne Moore is a 31 y.o. female presenting for IOL for cHTN & GDMA2  Doing well today w/ no complaints. Denies ctx, VB, LOF. +FM.  OB History     Gravida  3   Para  1   Term  1   Preterm  0   AB  1   Living  1      SAB  1   IAB  0   Ectopic  0   Multiple  0   Live Births  1          Past Medical History:  Diagnosis Date   Allergic rhinitis    GERD (gastroesophageal reflux disease)    Gestational diabetes    metformin   Hypertension    Hypothyroidism    Thyroid disease    hypothyroid   Past Surgical History:  Procedure Laterality Date   NO PAST SURGERIES     Family History: family history includes Cancer in her maternal grandmother and paternal grandfather; Colon cancer in her maternal grandmother; Diabetes in her father and mother; Heart attack in her paternal grandmother; Heart failure in her father; Hypertension in her father, mother, and paternal grandmother; Kidney failure in her mother. Social History:  reports that she has never smoked. She has never used smokeless tobacco. She reports that she does not drink alcohol and does not use drugs.     Maternal Diabetes: Yes:  Diabetes Type:  Insulin/Medication controlled Genetic Screening: Normal Maternal Ultrasounds/Referrals: Other: LGA w/ EFW 99% (last 3561g on 11/17) Fetal Ultrasounds or other Referrals:  None Maternal Substance Abuse:  No Significant Maternal Medications:  Meds include: Other:  amlodipine 10mg , procardia XL 30mg  BID, armour 90mg , metformin 500mg /1000mg  Significant Maternal Lab Results:  Group B Strep negative Number of Prenatal Visits:greater than 3 verified prenatal visits Other Comments:  None  Negative ROS as per HPI  Dilation: 2 Effacement (%): Thick Station: -3 Exam by:: RN Blood pressure 131/72, pulse (!) 101, temperature 98.4 F (36.9 C), temperature source Oral.  Gen: A&Ox3, NAD CV: Normal rate & rhythm Resp: Respirations easy and regular Abd:  Soft, nontender, gravid SCE: 2/th/-3 Vtx by sutures  FHT 140bpm/mod/+a/-d  Prenatal labs: ABO, Rh: --/--/O POS (12/08 1553) Antibody: NEG (12/08 1553) Rubella: 1.71 (06/15 1105) RPR: Non Reactive (09/29 0855)  HBsAg: Negative (06/15 1105)  HIV: Non Reactive (09/29 0855)  GBS: Negative/-- (11/17 1300)   Assessment/Plan: 31yo G3P1011 at [redacted]w[redacted]d p/f IOL for cHTN and GDMA2  IOL - T&S, CBC, RPR pending - cEFM/toco - Miso for cervical ripening - FB/AROM/pit as indicated  2. cHTN - Normotensive on admission, monitor Bps closely - continue home amlodipine & nifedipine  3. GDM - accuchecks  4. Hypothyroidism - continue home armour  5. FWB - Known LGA fetus, EFW extrapolates to 4000g - reasonable for trial of labor - FHT reactive & reassuring - vtx by sutures  08-09-1968 12/24/2021, 5:16 PM

## 2021-12-24 NOTE — Progress Notes (Signed)
HIGH-RISK PREGNANCY VISIT Patient name: Cheyenne Moore MRN [id number]  Date of birth: October 23, 1990 Chief Complaint:   Routine Prenatal Visit  History of Present Illness:   Cheyenne Moore is a 31 y.o. G37P1011 female at [redacted]w[redacted]d with an Estimated Date of Delivery: 01/05/22 being seen today for ongoing management of a high-risk pregnancy complicated by chronic hypertension currently on norvasc 10 + procardia xl 30 and diabetes mellitus A2DM currently on metformin 500 am/1000 qhs w/extrapolated EFW today of 4250 grams with elevated BP today which would require increased dosing .    Today she reports no complaints. Contractions: Irritability. Vag. Bleeding: None.  Movement: Present. denies leaking of fluid.      07/02/2021    9:00 AM 02/02/2021    2:57 PM 07/24/2019    9:08 AM 04/11/2019    9:08 AM 05/03/2018   10:17 AM  Depression screen PHQ 2/9  Decreased Interest 0 0 0 0 0  Down, Depressed, Hopeless 0 0 0 0 0  PHQ - 2 Score 0 0 0 0 0  Altered sleeping 0 0 0 0   Tired, decreased energy 2 1 1  0   Change in appetite 0 1 0 0   Feeling bad or failure about yourself  0 0 0 0   Trouble concentrating 0 0 0 0   Moving slowly or fidgety/restless 0 0 0 0   Suicidal thoughts 0 0 0 0   PHQ-9 Score 2 2 1  0         07/02/2021    9:00 AM 02/02/2021    2:57 PM 07/24/2019    9:08 AM  GAD 7 : Generalized Anxiety Score  Nervous, Anxious, on Edge 2 1 0  Control/stop worrying 1 0 0  Worry too much - different things 1 0 0  Trouble relaxing 0 0 0  Restless 0 0 0  Easily annoyed or irritable 1 0 0  Afraid - awful might happen 1 0 0  Total GAD 7 Score 6 1 0     Review of Systems:   Pertinent items are noted in HPI Denies abnormal vaginal discharge w/ itching/odor/irritation, headaches, visual changes, shortness of breath, chest pain, abdominal pain, severe nausea/vomiting, or problems with urination or bowel movements unless otherwise stated above. Pertinent History Reviewed:  Reviewed past  medical,surgical, social, obstetrical and family history.  Reviewed problem list, medications and allergies. Physical Assessment:   Vitals:   12/24/21 0912 12/24/21 0915  BP: (!) 150/102 (!) 142/102  Pulse: 91 94  Weight: (!) 324 lb (147 kg)   Body mass index is 52.29 kg/m.           Physical Examination:   General appearance: alert, well appearing, and in no distress  Mental status: alert, oriented to person, place, and time  Skin: warm & dry   Extremities: Edema: None    Cardiovascular: normal heart rate noted  Respiratory: normal respiratory effort, no distress  Abdomen: gravid, soft, non-tender  Pelvic: Cervical exam performed  Cx 2/th/-3 vertex       Fetal Status:     Movement: Present    Fetal Surveillance Testing today: BPP 8/8 UAD 42%   Chaperone:  Andrez Grime RN     Results for orders placed or performed in visit on 12/24/21 (from the past 24 hour(s))  POC Urinalysis Dipstick OB   Collection Time: 12/24/21  9:20 AM  Result Value Ref Range   Color, UA     Clarity, UA  Glucose, UA Negative Negative   Bilirubin, UA     Ketones, UA trace    Spec Grav, UA     Blood, UA trace    pH, UA     POC,PROTEIN,UA Negative Negative, Trace, Small (1+), Moderate (2+), Large (3+), 4+   Urobilinogen, UA     Nitrite, UA negative    Leukocytes, UA Large (3+) (A) Negative   Appearance     Odor      Assessment & Plan:  High-risk pregnancy: G3P1011 at [redacted]w[redacted]d with an Estimated Date of Delivery: 01/05/22      ICD-10-CM   1. Supervision of high risk pregnancy in third trimester  O09.93 POC Urinalysis Dipstick OB    2. Chronic hypertension  I10 POC Urinalysis Dipstick OB    3. Gestational diabetes mellitus (GDM) in third trimester controlled on oral hypoglycemic drug  O24.415 POC Urinalysis Dipstick OB        Meds: No orders of the defined types were placed in this encounter.   Orders:  Orders Placed This Encounter  Procedures   POC Urinalysis Dipstick OB      Labs/procedures today: U/S  Treatment Plan:  IOL today, called L&D + messaged Dr Alvester Morin    Follow-up: No follow-ups on file.   Future Appointments  Date Time Provider Department Center  12/26/2021  7:15 AM [facility] SCHED ROOM [facility] None    Orders Placed This Encounter  Procedures   POC Urinalysis Dipstick OB   Lazaro Arms  Attending Physician for the Center for [facility] 12/24/2021 10:00 AM

## 2021-12-24 NOTE — Progress Notes (Signed)
Labor Progress Note Cheyenne Moore is a 31 y.o. G3P1011 at [redacted]w[redacted]d presented for IOL.   S: No acute concerns today.   O:  BP 132/81   Pulse (!) 103   Temp 98.2 F (36.8 C) (Oral)   Resp 16   Ht 5\' 6"  (1.676 m)   LMP  (LMP Unknown)   BMI 52.29 kg/m  EFM: 145bpm/moderate/+accels, no decels  CVE: Dilation: 2 Effacement (%): 70 Station: -3 Presentation: Vertex Exam by:: [email or web address], DO, Hall MD   A&P: 31 y.o. [phone] [redacted]w[redacted]d here for IOL 2/2 cHTN, A2GDM.  #Labor: Progressing well. Internal os 2, external 4. Discussed FB w/ pit then AROM at next exam.  #Pain: Maternally supported, planning epidural #FWB: Cat I  #GBS negative  cHTN BP wnl.  -Continue to monitor  A2GDM CBG 151 after eating chicken tenders and fries -Check 2 hour post prandial; if elevated consider endo tool -Goal 90-120 -CBG q4 latent labor, q2 active labor  [name], DO 9:09 PM

## 2021-12-25 ENCOUNTER — Inpatient Hospital Stay (HOSPITAL_COMMUNITY): Payer: Medicaid Other | Admitting: Anesthesiology

## 2021-12-25 LAB — GLUCOSE, CAPILLARY
Glucose-Capillary: 112 mg/dL — ABNORMAL HIGH (ref 70–99)
Glucose-Capillary: 85 mg/dL (ref 70–99)
Glucose-Capillary: 88 mg/dL (ref 70–99)
Glucose-Capillary: 92 mg/dL (ref 70–99)
Glucose-Capillary: 98 mg/dL (ref 70–99)
Glucose-Capillary: 99 mg/dL (ref 70–99)

## 2021-12-25 LAB — CBC
HCT: 34.9 % — ABNORMAL LOW (ref 36.0–46.0)
Hemoglobin: 11.8 g/dL — ABNORMAL LOW (ref 12.0–15.0)
MCH: 28.8 pg (ref 26.0–34.0)
MCHC: 33.8 g/dL (ref 30.0–36.0)
MCV: 85.1 fL (ref 80.0–100.0)
Platelets: 224 10*3/uL (ref 150–400)
RBC: 4.1 MIL/uL (ref 3.87–5.11)
RDW: 14.9 % (ref 11.5–15.5)
WBC: 11.3 10*3/uL — ABNORMAL HIGH (ref 4.0–10.5)
nRBC: 0 % (ref 0.0–0.2)

## 2021-12-25 LAB — RPR: RPR Ser Ql: NONREACTIVE

## 2021-12-25 MED ORDER — LIDOCAINE HCL (PF) 1 % IJ SOLN
INTRAMUSCULAR | Status: DC | PRN
  Administered 2021-12-25: 3 mL via EPIDURAL
  Administered 2021-12-25: 5 mL via EPIDURAL

## 2021-12-25 NOTE — Anesthesia Procedure Notes (Signed)
Epidural Patient location during procedure: OB Start time: 12/25/2021 3:54 PM End time: 12/25/2021 4:00 PM  Staffing Anesthesiologist: Linton Rump, MD Performed: anesthesiologist   Preanesthetic Checklist Completed: patient identified, IV checked, site marked, risks and benefits discussed, surgical consent, monitors and equipment checked, pre-op evaluation and timeout performed  Epidural Patient position: sitting Prep: DuraPrep and site prepped and draped Patient monitoring: continuous pulse ox and blood pressure Approach: midline Location: L3-L4 Injection technique: LOR saline  Needle:  Needle type: Tuohy  Needle gauge: 17 G Needle length: 9 cm and 9 Needle insertion depth: 8 cm Catheter type: closed end flexible Catheter size: 19 Gauge Catheter at skin depth: 13 cm Test dose: negative  Assessment Events: blood not aspirated, injection not painful, no injection resistance, no paresthesia and negative IV test  Additional Notes The patient has requested an epidural for labor pain management. Risks and benefits including, but not limited to, infection, bleeding, local anesthetic toxicity, headache, hypotension, back pain, block failure, etc. were discussed with the patient. The patient expressed understanding and consented to the procedure. I confirmed that the patient has no bleeding disorders and is not taking blood thinners. I confirmed the patient's last platelet count with the nurse. A time-out was performed immediately prior to the procedure. Please see nursing documentation for vital signs. Sterile technique was used throughout the whole procedure. Once LOR achieved, the epidural catheter threaded easily without resistance. Aspiration of the catheter was negative for blood and CSF. The epidural was dosed slowly and an infusion was started.  1 attempt(s)Reason for block:procedure for pain

## 2021-12-25 NOTE — Progress Notes (Cosign Needed Addendum)
Labor Progress Note Cheyenne Moore is a 31 y.o. G3P1011 at [redacted]w[redacted]d presented for IOL d/t cHTN, A2GDM. S: Patient is resting comfortably.  O:  BP (!) 96/57   Pulse (!) 102   Temp 98.9 F (37.2 C) (Oral)   Resp 15   Ht 5\' 6"  (1.676 m)   Wt (!) 147 kg   LMP  (LMP Unknown)   SpO2 98%   BMI 52.31 kg/m   CVE: Dilation: 4 Effacement (%): 60, 70 Cervical Position: Middle Station: -3, Ballotable Presentation: Vertex Exam by:: [email or web address], DO  A&P: 31 y.o. 37 [redacted]w[redacted]d here for IOL d/t cHTN and A2GDM. #Labor: On pitocin 14 after medication break. IUPC replaced given presence of clot/malpositioning. Continue pitocin per protocol with contraction monitoring on IUPC. #Pain: Epidural placed #FWB: Cat 1 #cHTN: BP normal. #A2GDM: BG normal. Continue checks.  [redacted]w[redacted]d, MD Center for [facility], [facility] 11:54 PM

## 2021-12-25 NOTE — Progress Notes (Addendum)
Labor Progress Note Cheyenne Moore is a 31 y.o. G3P1011 at [redacted]w[redacted]d presented for IOL due to [place], A2GDM. S: Patient is resting comfortably. Wanting an epidural.  O:  BP 115/64   Pulse 81   Temp 98.2 F (36.8 C) (Oral)   Resp 16   Ht 5\' 6"  (1.676 m)   LMP  (LMP Unknown)   BMI 52.29 kg/m   CVE: Dilation: 3.5 Effacement (%): 70 Station: -3 Presentation: Vertex Exam by:: Dr [email or web address]  A&P: 31 y.o. G3P1011 [redacted]w[redacted]d here for IOL due to cHTN, A1GDM. #Labor: Latent. Wants to wait for AROM until more time with pitocin. Will reevaluate at next check. Consider IUPC.  #Pain: Wants epidural #FWB: Cat 1 #cHTN: BP WNL, will not restart home BP meds (procardia) at this time. Consider restarting if  #A2GDM: CBG 121 at last check, around goal of 90-120. Continue checks Q4 latent and Q2 active labor. Will hold metformin in inpatient setting with hopes CBG improves after delivery  [redacted]w[redacted]d, MD Center for [facility], [facility] 12:56 AM   GME ATTESTATION:  I saw and evaluated the patient. I agree with the findings and the plan of care as documented in the resident's note. I have made changes to documentation as necessary.  [facility], DO OB Fellow, Faculty [facility], Center for [facility] 12/25/2021, 1:04 AM

## 2021-12-25 NOTE — Progress Notes (Signed)
Labor Progress Note MATIA ZELADA is a 31 y.o. G3P1011 at [redacted]w[redacted]d presented for IOL due to [place], A2GDM. S: Patient is resting comfortably in the chair.  O:  BP 121/74   Pulse 83   Temp 98.2 F (36.8 C) (Oral)   Resp 15   Ht 5\' 6"  (1.676 m)   LMP  (LMP Unknown)   BMI 52.29 kg/m   CVE: Dilation: 3.5 Effacement (%): 70 Station: -3 Presentation: Vertex Exam by:: [email or web address] DO, Thoren Hosang MD  A&P: 31 y.o. [phone] [redacted]w[redacted]d here for IOL due to cHTN, A2GDM. #Labor: Latent. Continues to want to hold off on AROM and continue pit/mobilization. Consider IUPC and AROM at next check. #Pain: Epidural #FWB: Cat 1 #cHTN: BP WNL #A2GDM: CBGs WNL  [redacted]w[redacted]d, MD Center for [facility], [facility] 6:58 AM

## 2021-12-25 NOTE — Anesthesia Preprocedure Evaluation (Signed)
Anesthesia Evaluation  Patient identified by MRN, date of birth, ID band Patient awake    Reviewed: Allergy & Precautions, NPO status , Patient's Chart, lab work & pertinent test results  History of Anesthesia Complications Negative for: history of anesthetic complications  Airway Mallampati: III  TM Distance: >3 FB Neck ROM: Full    Dental no notable dental hx.    Pulmonary neg pulmonary ROS   Pulmonary exam normal breath sounds clear to auscultation       Cardiovascular hypertension,  Rhythm:Regular Rate:Normal     Neuro/Psych negative neurological ROS     GI/Hepatic ,GERD  ,,  Endo/Other  diabetes, GestationalHypothyroidism    Renal/GU      Musculoskeletal   Abdominal  (+) + obese  Peds  Hematology negative hematology ROS (+)   Anesthesia Other Findings   Reproductive/Obstetrics (+) Pregnancy                             Anesthesia Physical Anesthesia Plan  ASA: 3  Anesthesia Plan: Epidural   Post-op Pain Management:    Induction:   PONV Risk Score and Plan:   Airway Management Planned:   Additional Equipment:   Intra-op Plan:   Post-operative Plan:   Informed Consent: I have reviewed the patients History and Physical, chart, labs and discussed the procedure including the risks, benefits and alternatives for the proposed anesthesia with the patient or authorized representative who has indicated his/her understanding and acceptance.       Plan Discussed with:   Anesthesia Plan Comments: (I have discussed risks of neuraxial anesthesia including but not limited to infection, bleeding, nerve injury, back pain, headache, seizures, and failure of block. Patient denies bleeding disorders and is not currently anticoagulated. Labs have been reviewed. Risks and benefits discussed. All patient's questions answered.  )       Anesthesia Quick Evaluation

## 2021-12-25 NOTE — Progress Notes (Signed)
Labor Progress Note Cheyenne Moore is a 31 y.o. G3P1011 at [redacted]w[redacted]d presented for IOL due to [place], A2GDM.   1100 - evaluated her. Baby Cat 1. Cervix - 3/70/-3. Head felt really high, and not engaged in pelvis. Bedside US done to confirm vertex presentation.  Plan was to continue oxytocin, titration per protocol and recheck in 4hrs.  Blood pressures mostly normal. Intermittently mildly elevated.   1530 S: she is doing well. Feeling contractions intermittently. I had evaluat  O:  BP 120/73   Pulse 90   Temp 98.2 F (36.8 C) (Oral)   Resp 18   Ht 5\' 6"  (1.676 m)   Wt (!) 147 kg   LMP  (LMP Unknown)   SpO2 99%   BMI 52.31 kg/m  EFM: 136bpm /moderate /+accels, no decels  Contractions every 3- 4 mins. Difficult to trace.  CVE: Dilation: 4 Effacement (%): 60, 70 Station: -3 Presentation: Vertex Exam by:: Dr. [email or web address]   A&P: 31 y.o. G3P1011 [redacted]w[redacted]d IOL due to cHTN, A2GDM #Labor: Progressing well. Fetal head is better engaged in the pelvis now. AROM performed, with clear fluid. IUPC applied to aid in more consistent augmentation. - cut pitocin in half, restart at 16 units and titrate per protocol. - Recheck in 4hrs, sooner if indicated. #Pain: desires epidural when pain is more consistent. #FWB: Cat 1 #GBS negative  cHTN - blood pressures well controlled. No current meds.  A2GDM: blood glucose normal. No current meds.  Continue q4hr checks in latent labor, plan for q2hr checks in active labor  [name] [redacted]w[redacted]d, MD 4:42 PM

## 2021-12-25 NOTE — Progress Notes (Signed)
Labor Progress Note Cheyenne Moore is a 31 y.o. G3P1011 at [redacted]w[redacted]d presented for IOL due to [place], A2GDM.   S: She is doing well. Feeling contractions intermittently.  O:  BP 132/80   Pulse 66   Temp 98.6 F (37 C) (Oral)   Resp 18   Ht 5\' 6"  (1.676 m)   Wt (!) 147 kg   LMP  (LMP Unknown)   SpO2 98%   BMI 52.31 kg/m  EFM: 135 bpm /moderate variability /+accels, no decels  Contractions every 6-8 mins.  CVE: Dilation: 4.5 Effacement (%): 70 Cervical Position: Middle Station: -2 Presentation: Vertex Exam by:: Dr. [email or web address]  A&P: 31 y.o. G3P1011 [redacted]w[redacted]d IOL due to cHTN, A2GDM #Labor: Will restart pitocin per protocol. #Pain: desires epidural when pain is more consistent. #FWB: Cat 1 #GBS negative  cHTN - blood pressures well controlled. No current meds.  A2GDM: blood glucose normal. No current meds.  Continue q4hr checks in latent labor, plan for q2hr checks in active labor   [redacted]w[redacted]d, MD 6:53 PM

## 2021-12-26 ENCOUNTER — Inpatient Hospital Stay (HOSPITAL_COMMUNITY): Payer: Medicaid Other

## 2021-12-26 ENCOUNTER — Encounter (HOSPITAL_COMMUNITY): Payer: Self-pay | Admitting: Obstetrics & Gynecology

## 2021-12-26 DIAGNOSIS — O3663X0 Maternal care for excessive fetal growth, third trimester, not applicable or unspecified: Secondary | ICD-10-CM | POA: Diagnosis not present

## 2021-12-26 DIAGNOSIS — O24424 Gestational diabetes mellitus in childbirth, insulin controlled: Secondary | ICD-10-CM | POA: Diagnosis not present

## 2021-12-26 DIAGNOSIS — Z3A38 38 weeks gestation of pregnancy: Secondary | ICD-10-CM

## 2021-12-26 DIAGNOSIS — O99214 Obesity complicating childbirth: Secondary | ICD-10-CM

## 2021-12-26 DIAGNOSIS — O24419 Gestational diabetes mellitus in pregnancy, unspecified control: Secondary | ICD-10-CM

## 2021-12-26 DIAGNOSIS — I10 Essential (primary) hypertension: Secondary | ICD-10-CM

## 2021-12-26 DIAGNOSIS — O99284 Endocrine, nutritional and metabolic diseases complicating childbirth: Secondary | ICD-10-CM | POA: Diagnosis not present

## 2021-12-26 DIAGNOSIS — O2412 Pre-existing diabetes mellitus, type 2, in childbirth: Secondary | ICD-10-CM | POA: Diagnosis not present

## 2021-12-26 LAB — COMPREHENSIVE METABOLIC PANEL WITH GFR
ALT: 13 U/L (ref 0–44)
AST: 21 U/L (ref 15–41)
Albumin: 2.1 g/dL — ABNORMAL LOW (ref 3.5–5.0)
Alkaline Phosphatase: 117 U/L (ref 38–126)
Anion gap: 8 (ref 5–15)
BUN: <5 mg/dL — ABNORMAL LOW (ref 6–20)
CO2: 19 mmol/L — ABNORMAL LOW (ref 22–32)
Calcium: 9.2 mg/dL (ref 8.9–10.3)
Chloride: 110 mmol/L (ref 98–111)
Creatinine, Ser: 0.48 mg/dL (ref 0.44–1.00)
GFR, Estimated: >60 mL/min
Glucose, Bld: 97 mg/dL (ref 70–99)
Potassium: 3.8 mmol/L (ref 3.5–5.1)
Sodium: 137 mmol/L (ref 135–145)
Total Bilirubin: 0.6 mg/dL (ref 0.3–1.2)
Total Protein: 5.3 g/dL — ABNORMAL LOW (ref 6.5–8.1)

## 2021-12-26 LAB — CBC
HCT: 34.8 % — ABNORMAL LOW (ref 36.0–46.0)
HCT: 34.2 % — ABNORMAL LOW (ref 36.0–46.0)
Hemoglobin: 11.5 g/dL — ABNORMAL LOW (ref 12.0–15.0)
Hemoglobin: 11.3 g/dL — ABNORMAL LOW (ref 12.0–15.0)
MCH: 28.8 pg (ref 26.0–34.0)
MCH: 28.9 pg (ref 26.0–34.0)
MCHC: 33 g/dL (ref 30.0–36.0)
MCHC: 33 g/dL (ref 30.0–36.0)
MCV: 87 fL (ref 80.0–100.0)
MCV: 87.5 fL (ref 80.0–100.0)
Platelets: 211 10*3/uL (ref 150–400)
Platelets: 214 K/uL (ref 150–400)
RBC: 4 MIL/uL (ref 3.87–5.11)
RBC: 3.91 MIL/uL (ref 3.87–5.11)
RDW: 14.8 % (ref 11.5–15.5)
RDW: 15 % (ref 11.5–15.5)
WBC: 13.9 10*3/uL — ABNORMAL HIGH (ref 4.0–10.5)
WBC: 16 K/uL — ABNORMAL HIGH (ref 4.0–10.5)
nRBC: 0 % (ref 0.0–0.2)
nRBC: 0 % (ref 0.0–0.2)

## 2021-12-26 LAB — GLUCOSE, CAPILLARY
Glucose-Capillary: 84 mg/dL (ref 70–99)
Glucose-Capillary: 100 mg/dL — ABNORMAL HIGH (ref 70–99)
Glucose-Capillary: 90 mg/dL (ref 70–99)
Glucose-Capillary: 101 mg/dL — ABNORMAL HIGH (ref 70–99)

## 2021-12-26 MED ORDER — WITCH HAZEL-GLYCERIN EX PADS: 1.0000 | MEDICATED_PAD | CUTANEOUS | Status: DC | PRN

## 2021-12-26 MED ORDER — IBUPROFEN 600 MG PO TABS
600.0000 mg | ORAL_TABLET | Freq: Four times a day (QID) | ORAL | Status: DC
  Administered 2021-12-26 – 2021-12-28 (×8): 600 mg via ORAL
  Filled 2021-12-26 (×8): qty 1

## 2021-12-26 MED ORDER — BENZOCAINE-MENTHOL 20-0.5 % EX AERO
1.0000 | INHALATION_SPRAY | CUTANEOUS | Status: DC | PRN
  Filled 2021-12-26: qty 56

## 2021-12-26 MED ORDER — NIFEDIPINE ER OSMOTIC RELEASE 30 MG PO TB24
60.0000 mg | ORAL_TABLET | Freq: Every day | ORAL | Status: DC
  Administered 2021-12-26 – 2021-12-28 (×3): 60 mg via ORAL
  Filled 2021-12-26 (×3): qty 2

## 2021-12-26 MED ORDER — PRENATAL MULTIVITAMIN CH
1.0000 | ORAL_TABLET | Freq: Every day | ORAL | Status: DC
  Administered 2021-12-27 – 2021-12-28 (×2): 1 via ORAL
  Filled 2021-12-26 (×2): qty 1

## 2021-12-26 MED ORDER — COCONUT OIL OIL
1.0000 | TOPICAL_OIL | Status: DC | PRN
  Administered 2021-12-26 – 2021-12-28 (×2): 1 via TOPICAL

## 2021-12-26 MED ORDER — THYROID 60 MG PO TABS
90.0000 mg | ORAL_TABLET | Freq: Every day | ORAL | Status: DC
  Administered 2021-12-27 – 2021-12-28 (×2): 90 mg via ORAL
  Filled 2021-12-26 (×2): qty 1

## 2021-12-26 MED ORDER — FUROSEMIDE 20 MG PO TABS
20.0000 mg | ORAL_TABLET | Freq: Every day | ORAL | Status: DC
  Administered 2021-12-26 – 2021-12-28 (×3): 20 mg via ORAL
  Filled 2021-12-26 (×3): qty 1

## 2021-12-26 MED ORDER — LACTATED RINGERS IV SOLN: INTRAVENOUS | Status: DC

## 2021-12-26 MED ORDER — DIBUCAINE (PERIANAL) 1 % EX OINT: 1.0000 | TOPICAL_OINTMENT | CUTANEOUS | Status: DC | PRN

## 2021-12-26 MED ORDER — ACETAMINOPHEN 325 MG PO TABS
650.0000 mg | ORAL_TABLET | ORAL | Status: DC | PRN
  Administered 2021-12-26 – 2021-12-27 (×2): 650 mg via ORAL
  Filled 2021-12-26 (×3): qty 2

## 2021-12-26 MED ORDER — ENOXAPARIN SODIUM 80 MG/0.8ML IJ SOSY
70.0000 mg | PREFILLED_SYRINGE | INTRAMUSCULAR | Status: DC
  Administered 2021-12-27 – 2021-12-28 (×2): 70 mg via SUBCUTANEOUS
  Filled 2021-12-26 (×2): qty 0.8

## 2021-12-26 MED ORDER — TETANUS-DIPHTH-ACELL PERTUSSIS 5-2.5-18.5 LF-MCG/0.5 IM SUSY: 0.5000 mL | PREFILLED_SYRINGE | Freq: Once | INTRAMUSCULAR | Status: DC

## 2021-12-26 MED ORDER — SIMETHICONE 80 MG PO CHEW
80.0000 mg | CHEWABLE_TABLET | ORAL | Status: DC | PRN
  Administered 2021-12-27 – 2021-12-28 (×2): 80 mg via ORAL
  Filled 2021-12-26 (×2): qty 1

## 2021-12-26 MED ORDER — ONDANSETRON HCL 4 MG/2ML IJ SOLN: 4.0000 mg | INTRAMUSCULAR | Status: DC | PRN

## 2021-12-26 MED ORDER — ONDANSETRON HCL 4 MG PO TABS: 4.0000 mg | ORAL_TABLET | ORAL | Status: DC | PRN

## 2021-12-26 MED ORDER — MAGNESIUM HYDROXIDE 400 MG/5ML PO SUSP: 30.0000 mL | ORAL | Status: DC | PRN

## 2021-12-26 MED ORDER — MEASLES, MUMPS & RUBELLA VAC IJ SOLR: 0.5000 mL | Freq: Once | INTRAMUSCULAR | Status: DC

## 2021-12-26 NOTE — Lactation Note (Addendum)
This note was copied from a baby's chart. Lactation Consultation Note  Patient Name: Cheyenne Moore [id number] Date: 12/26/2021 Reason for consult: Initial assessment;Early term 37-38.6wks;Maternal endocrine disorder (GHTN, Hypothyroidism and GDM see Birth Parent medical record.) Age:29 hours Per Birth Parent, she BF 1st child for 16 months who is currently 44 years old. Birth Parent given hand pump to pre-pump breast prior to latching infant. LC assisted with latching infant on Birth Parent's left breast she has only been latching infant on the right breast. LC gave Birth Parent pillow support, pre-pump breast prior to latch and infant latched on the left breast chest to breast using the football hold position, infant sustained latch and BF for 11 minutes. Birth Parent pumped 17 mls of colostrum with hand pump and infant was given 7 mls by spoon that Birth Parent had expressed after latching infant at the breast. Birth Parent knows that EBM is safe at room temperature for 4 hours. Birth Parent will continue to pre-pump breast prior to latching infant and continue to BF infant according to hunger cues, 8 to 12+ times within 24 hours, STS. Birth Parent knows to call RN/LC for further latch assistance if needed. LC discussed importance of maternal rest, diet and hydration. Birth Parent has been applying coconut oil to breast prior to latching infant and when using hand pump.Mom made aware of O/P services, breastfeeding support groups, community resources, and our phone # for post-discharge questions.   Maternal Data    Feeding    LATCH Score Latch: Grasps breast easily, tongue down, lips flanged, rhythmical sucking.  Audible Swallowing: Spontaneous and intermittent  Type of Nipple: Flat (swollen areola ( edema))  Comfort (Breast/Nipple): Soft / non-tender  Hold (Positioning): Assistance needed to correctly position infant at breast and maintain latch.  LATCH Score: 8   Lactation Tools  Discussed/Used    Interventions Interventions: Breast feeding basics reviewed;Assisted with latch;Skin to skin;Adjust position;Breast compression;Pre-pump if needed;Hand pump;Education;LC Services brochure  Discharge Pump: DEBP;Personal (Medela DEBP at home.)  Consult Status Consult Status: Follow-up Date: 12/27/21 Follow-up type: In-patient    Frederico Hamman 12/26/2021, 11:26 PM

## 2021-12-26 NOTE — Progress Notes (Signed)
Called to patient's room regarding continued bleeding.  130 mL clot had already been removed and weight.  With uterine massage more bleeding was appreciated.  Lower uterine segment sweep completed.  Small amount of membranes appreciated and removed.  Uterus became firm.  In and out Foley cath ordered.  Will monitor.

## 2021-12-26 NOTE — Anesthesia Postprocedure Evaluation (Signed)
Anesthesia Post Note  Patient: Cheyenne Moore  Procedure(s) Performed: AN AD HOC LABOR EPIDURAL     Patient location during evaluation: [facility] Anesthesia Type: Epidural Level of consciousness: awake and alert Pain management: pain level controlled Vital Signs Assessment: post-procedure vital signs reviewed and stable Respiratory status: spontaneous breathing, nonlabored ventilation and respiratory function stable Cardiovascular status: stable Postop Assessment: no headache, no backache and epidural receding Anesthetic complications: no   No notable events documented.  Last Vitals:  Vitals:   12/26/21 1700 12/26/21 1800  BP: (!) 148/89 124/81  Pulse: 93 99  Resp: 18 18  Temp: 36.9 C 36.8 C  SpO2: 100% 99%    Last Pain:  Vitals:   12/26/21 1800  TempSrc: Oral  PainSc:    Pain Goal:                   [employer]

## 2021-12-26 NOTE — Lactation Note (Signed)
This note was copied from a baby's chart. Lactation Consultation Note  Patient Name: Cheyenne Moore [id number] Date: 12/26/2021 Reason for consult: L&D Initial assessment;Early term 37-38.6wks;Maternal endocrine disorder Age:29 mins PP  Baby STS on moms chest, LC offered to assist to latch, baby opened wide and sucked several times , colostrum noted, areola slightly compressible, and no depth obtained.  Per mom has been leaking, and due to DL , LC hand expressed from the other breast and spoon fed baby drops keeping baby STS.  See below under tools LC's recommendations to aid in the latching.  LC suspects latching will take time.    Maternal Data Has patient been taught Hand Expression?: Yes Does the patient have breastfeeding experience prior to this delivery?: Yes How long did the patient breastfeed?: per mom difficult latch in the being and has to use a NS - ended up BF 14 months and has been leaking for a few months  Feeding Mother's Current Feeding Choice: Breast Milk  LATCH Score Latch: Repeated attempts needed to sustain latch, nipple held in mouth throughout feeding, stimulation needed to elicit sucking reflex.  Audible Swallowing: None  Type of Nipple: Flat (swollen areolas)  Comfort (Breast/Nipple): Soft / non-tender  Hold (Positioning): Full assist, staff holds infant at breast  LATCH Score: 4   Lactation Tools Discussed/Used Tools:  (LC asked the LD RN to inform the [facility] in verbal report, mom will need a hand pump to start, coconut oil to soften tough slightly compressible areola  to work on the elongating and softening the tissue.)  Interventions Interventions: Breast feeding basics reviewed;Assisted with latch;Skin to skin;Hand express;Reverse pressure;Adjust position;Support pillows;Education  Discharge    Consult Status Consult Status: Follow-up from L&D Date: 12/26/21 Follow-up type: In-patient    Matilde Sprang Jayvion Stefanski 12/26/2021, 2:39 PM

## 2021-12-26 NOTE — Progress Notes (Signed)
Labor Progress Note Cheyenne Moore is a 31 y.o. G3P1011 at [redacted]w[redacted]d presented for IOL due to [place], A2GDM.  S: resting comfortable in no distress.   O:  BP 116/69   Pulse 89   Temp 98.2 F (36.8 C) (Oral)   Resp 18   Ht 5\' 6"  (1.676 m)   Wt (!) 147 kg   LMP  (LMP Unknown)   SpO2 98%   BMI 52.31 kg/m   CVE: Dilation: 10 Dilation Complete Date: 12/26/21 Dilation Complete Time: 1221 Effacement (%): 60, 70 Cervical Position: Middle Station: Plus 1 Presentation: Vertex (per BSUS) Exam by:: Dr .[email or web address]  A&P: 31 y.o. 37 [redacted]w[redacted]d here for IOL due to cHTN, A1GDM. #Labor: Patient currently on max dose of ~40.  End-user not adequate.  Rechecked patient and she is complete and +1 will ready the room and start pushing soon. #Pain: Epidural #FWB: Cat 1 #cHTN: BP WNL #A2GDM: CBG 90; goal of 90-120. Continue checks Q4 latent and Q2 active labor.   [redacted]w[redacted]d, MD OB Fellow, Faculty Practice [facility], Center for [facility] 12/26/2021, 12:27 PM

## 2021-12-26 NOTE — Plan of Care (Signed)
Problem: Education: Goal: Knowledge of General Education information will improve Description: Including pain rating scale, medication(s)/side effects and non-pharmacologic comfort measures Outcome: Progressing   Problem: Health Behavior/Discharge Planning: Goal: Ability to manage health-related needs will improve Outcome: Progressing   Problem: Clinical Measurements: Goal: Ability to maintain clinical measurements within normal limits will improve Outcome: Progressing Goal: Will remain free from infection Outcome: Progressing Goal: Diagnostic test results will improve Outcome: Progressing Goal: Respiratory complications will improve Outcome: Progressing Goal: Cardiovascular complication will be avoided Outcome: Progressing   Problem: Activity: Goal: Risk for activity intolerance will decrease Outcome: Progressing   Problem: Nutrition: Goal: Adequate nutrition will be maintained Outcome: Progressing   Problem: Coping: Goal: Level of anxiety will decrease Outcome: Progressing   Problem: Elimination: Goal: Will not experience complications related to bowel motility Outcome: Progressing Goal: Will not experience complications related to urinary retention Outcome: Progressing   Problem: Pain Managment: Goal: General experience of comfort will improve Outcome: Progressing   Problem: Safety: Goal: Ability to remain free from injury will improve Outcome: Progressing   Problem: Skin Integrity: Goal: Risk for impaired skin integrity will decrease Outcome: Progressing   Problem: Education: Goal: Knowledge of Childbirth will improve Outcome: Progressing Goal: Ability to make informed decisions regarding treatment and plan of care will improve Outcome: Progressing Goal: Ability to state and carry out methods to decrease the pain will improve Outcome: Progressing Goal: Individualized Educational Video(s) Outcome: Progressing   Problem: Coping: Goal: Ability to  verbalize concerns and feelings about labor and delivery will improve Outcome: Progressing   Problem: Life Cycle: Goal: Ability to make normal progression through stages of labor will improve Outcome: Progressing Goal: Ability to effectively push during vaginal delivery will improve Outcome: Progressing   Problem: Role Relationship: Goal: Will demonstrate positive interactions with the child Outcome: Progressing   Problem: Safety: Goal: Risk of complications during labor and delivery will decrease Outcome: Progressing   Problem: Pain Management: Goal: Relief or control of pain from uterine contractions will improve Outcome: Progressing   Problem: Education: Goal: Knowledge of disease or condition will improve Outcome: Progressing Goal: Knowledge of the prescribed therapeutic regimen will improve Outcome: Progressing   Problem: Fluid Volume: Goal: Peripheral tissue perfusion will improve Outcome: Progressing   Problem: Clinical Measurements: Goal: Complications related to disease process, condition or treatment will be avoided or minimized Outcome: Progressing   Problem: Education: Goal: Ability to describe self-care measures that may prevent or decrease complications (Diabetes Survival Skills Education) will improve Outcome: Progressing Goal: Individualized Educational Video(s) Outcome: Progressing   Problem: Coping: Goal: Ability to adjust to condition or change in health will improve Outcome: Progressing   Problem: Fluid Volume: Goal: Ability to maintain a balanced intake and output will improve Outcome: Progressing   Problem: Health Behavior/Discharge Planning: Goal: Ability to identify and utilize available resources and services will improve Outcome: Progressing Goal: Ability to manage health-related needs will improve Outcome: Progressing   Problem: Metabolic: Goal: Ability to maintain appropriate glucose levels will improve Outcome: Progressing    Problem: Nutritional: Goal: Maintenance of adequate nutrition will improve Outcome: Progressing Goal: Progress toward achieving an optimal weight will improve Outcome: Progressing   Problem: Skin Integrity: Goal: Risk for impaired skin integrity will decrease Outcome: Progressing   Problem: Tissue Perfusion: Goal: Adequacy of tissue perfusion will improve Outcome: Progressing   Problem: Education: Goal: Knowledge of condition will improve Outcome: Progressing Goal: Individualized Educational Video(s) Outcome: Progressing Goal: Individualized Newborn Educational Video(s) Outcome: Progressing   Problem: Activity: Goal: Will  verbalize the importance of balancing activity with adequate rest periods Outcome: Progressing Goal: Ability to tolerate increased activity will improve Outcome: Progressing   Problem: Coping: Goal: Ability to identify and utilize available resources and services will improve Outcome: Progressing

## 2021-12-26 NOTE — Progress Notes (Signed)
Labor Progress Note Cheyenne Moore is a 31 y.o. G3P1011 at [redacted]w[redacted]d presented for IOL due to [place], A2GDM.  S: No acute concerns.   O:  BP 116/62   Pulse 93   Temp 98 F (36.7 C) (Axillary)   Resp 15   Ht 5\' 6"  (1.676 m)   Wt (!) 147 kg   LMP  (LMP Unknown)   SpO2 98%   BMI 52.31 kg/m   CVE: Dilation: 4 Effacement (%): 60, 70 Cervical Position: Middle Station: Ballotable Presentation: Vertex (per BSUS) Exam by:: Melissa Hopkins RN  A&P: 31 y.o. G3P1011 [redacted]w[redacted]d here for IOL due to cHTN, A1GDM. #Labor: MVUs are 110 on 22 of pit. IUPC was replaced x1, new bag of pitocin started at 7pm 12/09.  #Pain: Epidural #FWB: Cat 1 #cHTN: BP WNL #A2GDM: CBG 84; goal of 90-120. Continue checks Q4 latent and Q2 active labor.   14/09, DO OB Fellow, Faculty [facility], Center for [facility] 12/26/2021, 4:22 AM

## 2021-12-26 NOTE — Discharge Summary (Cosign Needed Addendum)
Postpartum Discharge Summary  Date of Service updated     Patient Name: Cheyenne Moore DOB: May 16, 1990 MRN: [id number]  Date of admission: 12/24/2021 Delivery date:12/26/2021  Delivering provider: Concepcion Living  Date of discharge: 12/28/2021  Admitting diagnosis: Chronic hypertension affecting pregnancy [O10.919] Intrauterine pregnancy: [redacted]w[redacted]d    Secondary diagnosis:  Principal Problem:   Postpartum care following vaginal delivery Active Problems:   Hypothyroid   Morbid obesity (HPinal   Chronic hypertension   Supervision of high-risk pregnancy   Gestational diabetes  Additional problems: Hypokalemia s/p repletion    Discharge diagnosis: Term Pregnancy Delivered, CHTN, and GDM A1                                              Post partum procedures: none Augmentation: AROM, Pitocin, Cytotec, and IP Foley Complications: None  Hospital course: Induction of Labor With Vaginal Delivery   31y.o. yo G3P1011 at 335w4das admitted to the hospital 12/24/2021 for induction of labor.  Indication for induction: A2 DM and cHTN .  Patient had an labor course complicated by prolonged labor Membrane Rupture Time/Date: 3:04 PM ,12/25/2021   Delivery Method:Vaginal, Spontaneous  Episiotomy: None  Lacerations:  1st degree  Details of delivery can be found in separate delivery note.  Patient had a postpartum course complicated by mild hypokalemia to 3.2 and was repleted. Some acute blood loss anemia - patient was started on PO ferrous sulfate.  Patient is discharged home 12/28/21.  Newborn Data: Birth date:12/26/2021  Birth time:1:50 PM  Gender:Female  Living status:Living  Apgars:8 ,9  Weight:3690 g   Magnesium Sulfate received: No BMZ received: No Rhophylac:N/A MMR:N/A T-DaP:Given prenatally Flu: Yes Transfusion:No  Physical exam  Vitals:   12/27/21 0655 12/27/21 1454 12/27/21 2123 12/28/21 0500  BP: 125/81 125/72 133/79 121/64  Pulse: 86 72 94 77  Resp: _0 Temp:  98.4 F (36.9 C) 98.2 F (36.8 C) 97.8 F (36.6 C) 97.6 F (36.4 C)  TempSrc: Oral Oral Oral Oral  SpO2: 99% 100% 99% 100%  Weight:      Height:       General: alert, cooperative, and no distress Lochia: appropriate Uterine Fundus: firm Incision: N/A DVT Evaluation: No evidence of DVT seen on physical exam. No cords or calf tenderness. 1+ calf/ankle edema. Labs: Lab Results  Component Value Date   WBC 9.9 12/27/2021   HGB 9.7 (L) 12/27/2021   HCT 28.8 (L) 12/27/2021   MCV 86.0 12/27/2021   PLT 186 12/27/2021      Latest Ref Rng & Units 12/27/2021    4:35 AM  CMP  Glucose 70 - 99 mg/dL 127   BUN 6 - 20 mg/dL <5   Creatinine 0.44 - 1.00 mg/dL 0.43   Sodium 135 - 145 mmol/L 136   Potassium 3.5 - 5.1 mmol/L 3.2   Chloride 98 - 111 mmol/L 109   CO2 22 - 32 mmol/L 21   Calcium 8.9 - 10.3 mg/dL 8.6   Total Protein 6.5 - 8.1 g/dL 4.6   Total Bilirubin 0.3 - 1.2 mg/dL 0.4   Alkaline Phos 38 - 126 U/L 99   AST 15 - 41 U/L 21   ALT 0 - 44 U/L 12    Edinburgh Score:    11/25/2019   12:10 PM  Edinburgh Postnatal Depression Scale  Screening Tool  I have been able to laugh and see the funny side of things. 0  I have looked forward with enjoyment to things. 0  I have blamed myself unnecessarily when things went wrong. 0  I have been anxious or worried for no good reason. 1  I have felt scared or panicky for no good reason. 1  Things have been getting on top of me. 1  I have been so unhappy that I have had difficulty sleeping. 0  I have felt sad or miserable. 1  I have been so unhappy that I have been crying. 1  The thought of harming myself has occurred to me. 0  Edinburgh Postnatal Depression Scale Total 5     After visit meds:  Allergies as of 12/28/2021       Reactions   Red Dye Other (See Comments)   GI upset        Medication List     STOP taking these medications    Accu-Chek Guide Me w/Device Kit   Accu-Chek Guide test strip Generic drug: glucose  blood   Accu-Chek Softclix Lancets lancets   aspirin 81 MG chewable tablet   cholecalciferol 1000 units tablet Commonly known as: VITAMIN D   CLARITIN PO   dextromethorphan-guaiFENesin 30-600 MG 12hr tablet Commonly known as: MUCINEX DM   guaifenesin 100 MG/5ML syrup Commonly known as: ROBITUSSIN   metFORMIN 500 MG tablet Commonly known as: GLUCOPHAGE   omeprazole 20 MG capsule Commonly known as: PRILOSEC   prenatal multivitamin Tabs tablet       TAKE these medications    acetaminophen 325 MG tablet Commonly known as: Tylenol Take 2 tablets (650 mg total) by mouth every 4 (four) hours as needed (for pain scale < 4). What changed:  when to take this reasons to take this   amLODipine 10 MG tablet Commonly known as: NORVASC TAKE 1 TABLET(10 MG) BY MOUTH DAILY   Armour Thyroid 90 MG tablet Generic drug: thyroid TAKE 1 TABLET BY MOUTH DAILY BEFORE BREAKFAST What changed: Another medication with the same name was added. Make sure you understand how and when to take each.   thyroid 90 MG tablet Commonly known as: Armour Thyroid Take 1 tablet (90 mg total) by mouth daily before breakfast. What changed: You were already taking a medication with the same name, and this prescription was added. Make sure you understand how and when to take each.   benzocaine-Menthol 20-0.5 % Aero Commonly known as: DERMOPLAST Apply 1 Application topically as needed (perineal discomfort).   coconut oil Oil Apply 1 Application topically as needed.   ferrous sulfate 325 (65 FE) MG tablet Take 1 tablet (325 mg total) by mouth every other day. Start taking on: December 29, 2021   furosemide 20 MG tablet Commonly known as: LASIX Take 1 tablet (20 mg total) by mouth daily for 3 days.   ibuprofen 600 MG tablet Commonly known as: ADVIL Take 1 tablet (600 mg total) by mouth every 6 (six) hours.   NIFEdipine 60 MG 24 hr tablet Commonly known as: ADALAT CC Take 1 tablet (60 mg total)  by mouth daily. What changed:  medication strength how much to take when to take this   norethindrone 0.35 MG tablet Commonly known as: Ortho Micronor Take 1 tablet (0.35 mg total) by mouth daily.         Discharge home in stable condition Infant Feeding: Breast Infant Disposition:home with mother Discharge instruction: per After Visit Summary  and Postpartum booklet. Activity: Advance as tolerated. Pelvic rest for 6 weeks.  Diet: low salt diet Future Appointments: Future Appointments  Date Time Provider [place]  01/03/2022 10:30 AM [facility] NURSE [facility]  01/24/2022  8:50 AM [facility] LAB [facility]  01/24/2022  9:30 AM Shaurya Rawdon-Dishmon, Joaquim Lai, CNM [facility]   Follow up Visit:  Message sent   Please schedule this patient for a In person postpartum visit in 4 weeks with the following provider: Any provider. Additional Postpartum F/U:2 hour GTT and BP check 1 week  High risk pregnancy complicated by: GDM and HTN Delivery mode:  Vaginal, Spontaneous  Anticipated Birth Control:  POPs   12/28/2021 Jiyun N. Radene Knee, MD I have seen and examined this patient and agree the above assessment.  Respiratory effort normal, lochia appropriate, legs negative,  pain level normal.  Christin Fudge 12/30/2021 10:37 AM

## 2021-12-27 LAB — CBC
HCT: 28.8 % — ABNORMAL LOW (ref 36.0–46.0)
Hemoglobin: 9.7 g/dL — ABNORMAL LOW (ref 12.0–15.0)
MCH: 29 pg (ref 26.0–34.0)
MCHC: 33.7 g/dL (ref 30.0–36.0)
MCV: 86 fL (ref 80.0–100.0)
Platelets: 186 10*3/uL (ref 150–400)
RBC: 3.35 MIL/uL — ABNORMAL LOW (ref 3.87–5.11)
RDW: 15 % (ref 11.5–15.5)
WBC: 9.9 10*3/uL (ref 4.0–10.5)
nRBC: 0 % (ref 0.0–0.2)

## 2021-12-27 LAB — COMPREHENSIVE METABOLIC PANEL
ALT: 12 U/L (ref 0–44)
AST: 21 U/L (ref 15–41)
Albumin: 1.9 g/dL — ABNORMAL LOW (ref 3.5–5.0)
Alkaline Phosphatase: 99 U/L (ref 38–126)
Anion gap: 6 (ref 5–15)
BUN: <5 mg/dL — ABNORMAL LOW (ref 6–20)
CO2: 21 mmol/L — ABNORMAL LOW (ref 22–32)
Calcium: 8.6 mg/dL — ABNORMAL LOW (ref 8.9–10.3)
Chloride: 109 mmol/L (ref 98–111)
Creatinine, Ser: 0.43 mg/dL — ABNORMAL LOW (ref 0.44–1.00)
GFR, Estimated: >60 mL/min (ref >60)
Glucose, Bld: 127 mg/dL — ABNORMAL HIGH (ref 70–99)
Potassium: 3.2 mmol/L — ABNORMAL LOW (ref 3.5–5.1)
Sodium: 136 mmol/L (ref 135–145)
Total Bilirubin: 0.4 mg/dL (ref 0.3–1.2)
Total Protein: 4.6 g/dL — ABNORMAL LOW (ref 6.5–8.1)

## 2021-12-27 LAB — GLUCOSE, CAPILLARY: Glucose-Capillary: 115 mg/dL — ABNORMAL HIGH (ref 70–99)

## 2021-12-27 MED ORDER — FERROUS SULFATE 325 (65 FE) MG PO TABS
325.0000 mg | ORAL_TABLET | ORAL | Status: DC
  Administered 2021-12-27: 325 mg via ORAL
  Filled 2021-12-27: qty 1

## 2021-12-27 MED ORDER — POTASSIUM CHLORIDE CRYS ER 20 MEQ PO TBCR
40.0000 meq | EXTENDED_RELEASE_TABLET | Freq: Once | ORAL | Status: AC
  Administered 2021-12-27: 40 meq via ORAL
  Filled 2021-12-27: qty 2

## 2021-12-27 NOTE — Progress Notes (Signed)
Circumcision Consent   -Circumcision procedure details discussed including usage of local anesthesia, infant soother, and removal and disposal of foreskin. -Risks and benefits of procedure were reviewed including, but not limited to:  *Benefits include reduction in the rates of urinary tract infection (UTI), penile cancer, some sexually transmitted infections, penile inflammatory, and retractile disorders, as well as easier hygiene.   *Risks include bleeding, infection, injury of glans which may lead to need for additional surgery, penile deformity, or urinary tract issues, unsatisfactory cosmetic appearance and other potential complications related to the procedure.   -Informed that procedure will not be performed if provider deems inappropriate d/t penile size, noted deformity, or unsatisfactory pediatric evaluation. -It was emphasized that this is an elective procedure.   -Post circumcision care discussed.   Patient wants to proceed with circumcision. Informed that team would be notified and complete prior to discharge and upon satisfactory pediatric evaluation of infant.    Mikhaila Roh N. Cherly Hensen, MD PGY-1 [facility], Center for [facility] 12/27/21 7:56 AM

## 2021-12-27 NOTE — Progress Notes (Signed)
Mom's CBG taken this morning at 0652 was not a true fasting sugar, as mom had eaten some leftover dinner starting around 0300 today.

## 2021-12-27 NOTE — Progress Notes (Addendum)
POSTPARTUM PROGRESS NOTE  Post Partum Day 1  Subjective:  Cheyenne Moore is a 31 y.o. [id number] s/p VD at [redacted]w[redacted]d.  No acute events overnight.  Pt denies problems with ambulating, voiding or po intake.  She denies nausea or vomiting.  Pain is well controlled.  She has had flatus. Lochia Small.   Objective: Blood pressure 125/81, pulse 86, temperature 98.4 F (36.9 C), temperature source Oral, resp. rate 16, height 5\' 6"  (1.676 m), weight (!) 147 kg, SpO2 99 %, unknown if currently breastfeeding.  Physical Exam:  General: alert, cooperative and no distress Chest: no respiratory distress Heart:regular rate, distal pulses intact Abdomen: soft, nontender Uterine Fundus: firm, appropriately tender DVT Evaluation: No calf swelling or tenderness Extremities: 2+ edema bilaterally Skin: warm, dry  Recent Labs    12/26/21 1723 12/27/21 0435  HGB 11.3* 9.7*  HCT 34.2* 28.8*    Assessment/Plan: Cheyenne Moore is a 31 y.o. 37 s/p VD at [redacted]w[redacted]d   PPD#1 - Doing well Contraception: POPs Feeding: breast Dispo: Plan for discharge tomorrow.  Anemia: PO iron   LOS: 3 days   [redacted]w[redacted]d., MD, CNM 12/27/2021, 7:52 AM

## 2021-12-28 ENCOUNTER — Other Ambulatory Visit (HOSPITAL_COMMUNITY): Payer: Self-pay

## 2021-12-28 ENCOUNTER — Other Ambulatory Visit: Payer: Medicaid Other

## 2021-12-28 LAB — GLUCOSE, CAPILLARY: Glucose-Capillary: 85 mg/dL (ref 70–99)

## 2021-12-28 MED ORDER — FERROUS SULFATE 325 (65 FE) MG PO TABS
325.0000 mg | ORAL_TABLET | ORAL | 0 refills | Status: DC
  Filled 2021-12-28: qty 15, 30d supply, fill #0

## 2021-12-28 MED ORDER — IBUPROFEN 600 MG PO TABS
600.0000 mg | ORAL_TABLET | Freq: Four times a day (QID) | ORAL | 0 refills | Status: DC
  Filled 2021-12-28: qty 30, 8d supply, fill #0

## 2021-12-28 MED ORDER — BENZOCAINE-MENTHOL 20-0.5 % EX AERO
1.0000 | INHALATION_SPRAY | CUTANEOUS | 0 refills | Status: AC | PRN
  Filled 2021-12-28: qty 78, 30d supply, fill #0

## 2021-12-28 MED ORDER — FUROSEMIDE 20 MG PO TABS
20.0000 mg | ORAL_TABLET | Freq: Every day | ORAL | 0 refills | Status: DC
  Filled 2021-12-28: qty 3, 3d supply, fill #0

## 2021-12-28 MED ORDER — NORETHINDRONE 0.35 MG PO TABS
1.0000 | ORAL_TABLET | Freq: Every day | ORAL | 11 refills | Status: DC
  Filled 2021-12-28: qty 28, 28d supply, fill #0

## 2021-12-28 MED ORDER — ACETAMINOPHEN 325 MG PO TABS: 650.0000 mg | ORAL_TABLET | ORAL | Status: AC | PRN

## 2021-12-28 MED ORDER — THYROID 90 MG PO TABS
90.0000 mg | ORAL_TABLET | Freq: Every day | ORAL | 0 refills | Status: DC
  Filled 2021-12-28: qty 30, 30d supply, fill #0

## 2021-12-28 MED ORDER — NIFEDIPINE ER 60 MG PO TB24
60.0000 mg | ORAL_TABLET | Freq: Every day | ORAL | 0 refills | Status: DC
  Filled 2021-12-28: qty 30, 30d supply, fill #0

## 2021-12-28 MED ORDER — COCONUT OIL OIL: 1.0000 | TOPICAL_OIL | 0 refills | Status: DC | PRN

## 2021-12-28 NOTE — Lactation Note (Signed)
This note was copied from a baby's chart. Lactation Consultation Note  Patient Name: Cheyenne Moore OEUMP'N Date: 12/28/2021 Reason for consult: Follow-up assessment;Early term 37-38.6wks Age:29 hours Experienced BF mom stated baby is BF well having no trouble. Baby has good output. Mom's nipples are intact. Mom has no complaints. Reminded of LC OP services, STS, I&O.  Mom is excited about going home today and has no questions or concerns.  Maternal Data    Feeding    LATCH Score Latch: Grasps breast easily, tongue down, lips flanged, rhythmical sucking.  Audible Swallowing: Spontaneous and intermittent  Type of Nipple: Everted at rest and after stimulation  Comfort (Breast/Nipple): Soft / non-tender  Hold (Positioning): No assistance needed to correctly position infant at breast.  LATCH Score: 10   Lactation Tools Discussed/Used    Interventions    Discharge Discharge Education: Engorgement and breast care;Warning signs for feeding baby  Consult Status Consult Status: Complete Date: 12/28/21    Charyl Dancer 12/28/2021, 6:04 AM

## 2021-12-29 ENCOUNTER — Telehealth: Payer: Self-pay

## 2021-12-29 NOTE — Patient Outreach (Signed)
  Care Coordination TOC Note Transition Care Management Follow-up Telephone Call Date of discharge and from where: 12/28/21-[facility]  Dx:"vaginal delivery,HTN" How have you been since you were released from the hospital? Patient voices that her and baby are doing well so far. No issues since getting home yesterday. She stayed at family/friends home where she did not have BP machine to check BP. She is headed home and will begin monitoring BP at home. Denies any headaches. She has had occasional pain from delivery managed with Tylenol. Any questions or concerns? No  Items Reviewed: Did the pt receive and understand the discharge instructions provided? Yes  Medications obtained and verified? No -brief call-patient had to tend to crying baby Other? Yes  Any new allergies since your discharge? No  Dietary orders reviewed? Yes Do you have support at home? Yes   Home Care and Equipment/Supplies: Were home health services ordered? not applicable If so, what is the name of the agency? N/A  Has the agency set up a time to come to the patient's home? not applicable Were any new equipment or medical supplies ordered?  No What is the name of the medical supply agency? N/A Were you able to get the supplies/equipment? not applicable Do you have any questions related to the use of the equipment or supplies? No  Functional Questionnaire: (I = Independent and D = Dependent) ADLs: I  Bathing/Dressing- I  Meal Prep- I  Eating- I  Maintaining continence- I  Transferring/Ambulation- I  Managing Meds- I  Follow up appointments reviewed: PCP Hospital f/u appt confirmed? No  . Specialist Hospital f/u appt confirmed? Patient will call to make an appt with  . Are transportation arrangements needed? No  If their condition worsens, is the pt aware to call PCP or go to the Emergency Dept.? Yes Was the patient provided with contact information for the PCP's office or ED? Yes Was to  pt encouraged to call back with questions or concerns? Yes  SDOH assessments and interventions completed:   Yes SDOH Interventions Today    Flowsheet Row Most Recent Value  SDOH Interventions   Food Insecurity Interventions Intervention Not Indicated  Transportation Interventions Intervention Not Indicated       Care Coordination Interventions:  Education provided    Encounter Outcome:  Pt. Visit Completed    Antionette Fairy, RN,BSN,CCM [facility] Care Management Telephonic Care Management Coordinator Direct Phone: [phone] Toll Free: [phone] Fax: [phone]

## 2021-12-30 ENCOUNTER — Encounter: Payer: Self-pay | Admitting: *Deleted

## 2021-12-31 ENCOUNTER — Encounter: Payer: Medicaid Other | Admitting: Obstetrics & Gynecology

## 2021-12-31 ENCOUNTER — Other Ambulatory Visit: Payer: Medicaid Other

## 2022-01-03 ENCOUNTER — Telehealth (INDEPENDENT_AMBULATORY_CARE_PROVIDER_SITE_OTHER): Payer: Medicaid Other | Admitting: *Deleted

## 2022-01-03 VITALS — BP 134/91

## 2022-01-03 DIAGNOSIS — I10 Essential (primary) hypertension: Secondary | ICD-10-CM

## 2022-01-03 NOTE — Progress Notes (Addendum)
   NURSE VISIT- BLOOD PRESSURE CHECK  I connected [name] on 01/03/2022 by MyChart video and verified that I am speaking with the correct person using two identifiers.   I discussed the limitations of evaluation and management by telemedicine. The patient expressed understanding and agreed to proceed.  Nurse is at the office, and patient is at home.  SUBJECTIVE:  LORRAINA Moore is a 31 y.o. G45P2012 female here for BP check. She is postpartum, delivery date 12/26/21     HYPERTENSION ROS:  Postpartum:  Severe headaches that don't go away with tylenol/other medicines: No  Visual changes (seeing spots/double/blurred vision) No  Severe pain under right breast breast or in center of upper chest No  Severe nausea/vomiting No  Taking medicines as instructed yes  OBJECTIVE:  BP (!) 134/91 (BP Location: Right Arm, Patient Position: Sitting, Cuff Size: Normal)   Breastfeeding Yes   Appearance alert, well appearing, and in no distress.  ASSESSMENT: Postpartum  blood pressure check  PLAN: Discussed with Philipp Deputy, CNM   Recommendations: check pre-e labs today   Follow-up: in 3 months   Clarabell Matsuoka  01/03/2022 11:15 AM

## 2022-01-04 ENCOUNTER — Encounter: Payer: Self-pay | Admitting: *Deleted

## 2022-01-04 ENCOUNTER — Other Ambulatory Visit: Payer: Medicaid Other

## 2022-01-24 ENCOUNTER — Ambulatory Visit: Payer: Medicaid Other | Admitting: Advanced Practice Midwife

## 2022-01-24 ENCOUNTER — Other Ambulatory Visit: Payer: Medicaid Other

## 2022-02-01 ENCOUNTER — Other Ambulatory Visit: Payer: Medicaid Other

## 2022-02-01 ENCOUNTER — Ambulatory Visit: Payer: Medicaid Other | Admitting: Women's Health

## 2022-02-18 ENCOUNTER — Ambulatory Visit (INDEPENDENT_AMBULATORY_CARE_PROVIDER_SITE_OTHER): Payer: Medicaid Other | Admitting: Advanced Practice Midwife

## 2022-02-18 ENCOUNTER — Encounter: Payer: Self-pay | Admitting: Advanced Practice Midwife

## 2022-02-18 ENCOUNTER — Other Ambulatory Visit: Payer: Medicaid Other

## 2022-02-18 DIAGNOSIS — Z8632 Personal history of gestational diabetes: Secondary | ICD-10-CM

## 2022-02-18 DIAGNOSIS — Z131 Encounter for screening for diabetes mellitus: Secondary | ICD-10-CM

## 2022-02-18 MED ORDER — NIFEDIPINE ER OSMOTIC RELEASE 30 MG PO TB24: 30.0000 mg | ORAL_TABLET | Freq: Every day | ORAL | 6 refills | Status: DC

## 2022-02-18 MED ORDER — THYROID 90 MG PO TABS: 90.0000 mg | ORAL_TABLET | Freq: Every day | ORAL | 11 refills | Status: DC

## 2022-02-18 NOTE — Progress Notes (Signed)
POSTPARTUM VISIT Patient name: Cheyenne Moore MRN [id number]  Date of birth: 01-03-1991 Chief Complaint:   Postpartum Care (PN2)  History of Present Illness:   Cheyenne Moore is a 32 y.o. G38P2012 Caucasian female being seen today for a postpartum visit. She is 8 weeks postpartum following a spontaneous vaginal delivery at 38.4 gestational weeks. IOL: yes, for chronic hypertension  and diabetes mellitus A2DM . Anesthesia: epidural.  Laceration: 1st degree perineal.  Complications: none. Inpatient contraception: yes , given rx Micronor and is on first pack .   Pregnancy complicated by cHTN, GDMA2, hypothyroid . Tobacco use: no. Substance use disorder: no. Last pap smear: Jan 2023 and results were NILM w/ HRHPV negative. Next pap smear due: Jan 2026 No LMP recorded.  Postpartum course has been uncomplicated. Bleeding none. Bowel function is normal. Bladder function is normal. Urinary incontinence? no, fecal incontinence? no Patient is sexually active. Last sexual activity:  last 1-2wks . Desired contraception: POPs. Patient does not want a pregnancy in the future.  Desired family size is 2 children.   Upstream - 02/18/22 0930       Pregnancy Intention Screening   Does the patient want to become pregnant in the next year? No    Does the patient's partner want to become pregnant in the next year? No    Would the patient like to discuss contraceptive options today? N/A      Contraception Wrap Up   Current Method Oral Contraceptive    End Method Oral Contraceptive    Contraception Counseling Provided No            The pregnancy intention screening data noted above was reviewed. Potential methods of contraception were discussed. The patient elected to proceed with Oral Contraceptive.  Edinburgh Postpartum Depression Screening: positive: H/O mental health disorder: no. Currently on meds: no.  Currently in therapy: no.  Sleeping: as expected w newborn.  Appetite: nl.  Still finds joy in  things she used to: Yes.  Support at home: yes.  SI/HI/II: no.  Interested in medicine: No.  Interested in therapy: No.  Edinburgh Postnatal Depression Scale - 02/18/22 0923       Edinburgh Postnatal Depression Scale:  In the Past 7 Days   I have been able to laugh and see the funny side of things. 0    I have looked forward with enjoyment to things. 0    I have blamed myself unnecessarily when things went wrong. 1    I have been anxious or worried for no good reason. 1    I have felt scared or panicky for no good reason. 1    Things have been getting on top of me. 2    I have been so unhappy that I have had difficulty sleeping. 0    I have felt sad or miserable. 1    I have been so unhappy that I have been crying. 1    The thought of harming myself has occurred to me. 0    Edinburgh Postnatal Depression Scale Total 7                07/02/2021    9:00 AM 02/02/2021    2:57 PM 07/24/2019    9:08 AM  GAD 7 : Generalized Anxiety Score  Nervous, Anxious, on Edge 2 1 0  Control/stop worrying 1 0 0  Worry too much - different things 1 0 0  Trouble relaxing 0 0 0  Restless 0  0 0  Easily annoyed or irritable 1 0 0  Afraid - awful might happen 1 0 0  Total GAD 7 Score 6 1 0     Baby's course has been uncomplicated. Baby is feeding by breast: milk supply adequate. Infant has a pediatrician/family doctor? Yes.  Childcare strategy if returning to work/school: n/a-stay at home mom.  Pt has material needs met for her and baby: Yes.   Review of Systems:   Pertinent items are noted in HPI Denies Abnormal vaginal discharge w/ itching/odor/irritation, headaches, visual changes, shortness of breath, chest pain, abdominal pain, severe nausea/vomiting, or problems with urination or bowel movements. Pertinent History Reviewed:  Reviewed past medical,surgical, obstetrical and family history.  Reviewed problem list, medications and allergies. OB History  Gravida Para Term Preterm AB Living  3 2 2   0 1 2  SAB IAB Ectopic Multiple Live Births  1 0 0 0 2    # Outcome Date GA Lbr Len/2nd Weight Sex Delivery Anes PTL Lv  3 Term 12/26/21 [redacted]w[redacted]d 05:21 / 01:29 8 lb 2.2 oz (3.69 kg) M Vag-Spont EPI  LIV  2 Term 10/21/19 [redacted]w[redacted]d 12:38 / 02:08 7 lb 13 oz (3.544 kg) F Vag-Spont EPI N LIV     Birth Comments: WDL     Complications: Gestational diabetes  1 SAB 07/01/18 [redacted]w[redacted]d            Birth Comments: 9.4 week sized fetus   Physical Assessment:   Vitals:   02/18/22 0918  BP: 113/79  Pulse: 62  Weight: 290 lb 9.6 oz (131.8 kg)  Height: 5\' 6"  (1.676 m)  Body mass index is 46.9 kg/m.       Physical Examination:   General appearance: alert, well appearing, and in no distress  Mental status: alert, oriented to person, place, and time  Skin: warm & dry   Cardiovascular: normal heart rate noted   Respiratory: normal respiratory effort, no distress   Breasts: deferred, no complaints   Abdomen: soft, non-tender   Pelvic: normal external genitalia, vulva, vagina, cervix, uterus and adnexa; very sm spot granulation tissue at base of introitus. Thin prep pap obtained: No  Rectal: not examined  Extremities: Edema: none         No results found for this or any previous visit (from the past 24 hour(s)).  Assessment & Plan:  1) Postpartum exam 2) Seven wks s/p spontaneous vaginal delivery 3) breast feeding 4) Depression screening- mildly positive; is coping well 5) Contraception management: already on Micronor; will make sure there are refills for a year; can stay on it when weans due to [facility], however reviewed that IUD would be more effective esp since she doesn't desire another pregnancy 6) Dizzy at times, will get CBC today (Hgb 9.7 on PPD#1); also will decrease Procardia to 30mg  7) cHTN, currently on Procardia 60, however having some H/A and dizzy spells; will decrease dose to 30mg /day 8) GDMA2, GTT today  Essential components of care per ACOG recommendations:  1.  Mood and well being:  If  positive depression screen, discussed and plan developed.  If using tobacco we discussed reduction/cessation and risk of relapse If current substance abuse, we discussed and referral to local resources was offered.   2. Infant care and feeding:  If breastfeeding, discussed returning to work, pumping, breastfeeding-associated pain, guidance regarding return to fertility while lactating if not using another method. If needed, patient was provided with a letter to be allowed to pump q 2-3hrs to  support lactation in a private location with access to a refrigerator to store breastmilk.   Recommended that all caregivers be immunized for flu, pertussis and other preventable communicable diseases If pt does not have material needs met for her/baby, referred to local resources for help obtaining these.  3. Sexuality, contraception and birth spacing Provided guidance regarding sexuality, management of dyspareunia, and resumption of intercourse Discussed avoiding interpregnancy interval <39mths and recommended birth spacing of 18 months  4. Sleep and fatigue Discussed coping options for fatigue and sleep disruption Encouraged family/partner/community support of 4 hrs of uninterrupted sleep to help with mood and fatigue  5. Physical recovery  If pt had a C/S, assessed incisional pain and providing guidance on normal vs prolonged recovery If pt had a laceration, perineal healing and pain reviewed.  If urinary or fecal incontinence, discussed management and referred to PT or uro/gyn if indicated  Patient is safe to resume physical activity. Discussed attainment of healthy weight.  6.  Chronic disease management Discussed pregnancy complications if any, and their implications for future childbearing and long-term maternal health. Review recommendations for prevention of recurrent pregnancy complications, such as 17 hydroxyprogesterone caproate to reduce risk for recurrent PTB not applicable, or aspirin to  reduce risk of preeclampsia yes. Pt had GDM: yes. If yes, 2hr GTT scheduled: completing today. Reviewed medications and non-pregnant dosing including consideration of whether pt is breastfeeding using a reliable resource such as LactMed: yes Referred for f/u w/ PCP or subspecialist providers as indicated: needs to fill out paperwork at [facility] or [facility] to start care  7. Health maintenance Mammogram at 30yo or earlier if indicated Pap smears as indicated  Meds:  Meds ordered this encounter  Medications   NIFEdipine (PROCARDIA XL) 30 MG 24 hr tablet    Sig: Take 1 tablet (30 mg total) by mouth daily.    Dispense:  30 tablet    Refill:  6    Order Specific Question:   Supervising Provider    Answer:   Janyth Pupa [[id number]]   thyroid (ARMOUR THYROID) 90 MG tablet    Sig: Take 1 tablet (90 mg total) by mouth daily before breakfast.    Dispense:  30 tablet    Refill:  11    Order Specific Question:   Supervising Provider    Answer:   Janyth Pupa [[id number]]    Follow-up: Return in about 1 year (around 02/19/2023) for Physical.   Orders Placed This Encounter  Procedures   CBC    [facility] 02/18/2022 10:57 AM

## 2022-02-18 NOTE — Addendum Note (Signed)
Addended by: [name] on: 02/18/2022 10:53 AM   Modules accepted: Orders

## 2022-02-18 NOTE — Patient Instructions (Signed)
Use the site www.postpartum.net for postpartum support!

## 2022-02-19 LAB — CBC
Hematocrit: 39.3 % (ref 34.0–46.6)
Hemoglobin: 12.6 g/dL (ref 11.1–15.9)
MCH: 27.7 pg (ref 26.6–33.0)
MCHC: 32.1 g/dL (ref 31.5–35.7)
MCV: 86 fL (ref 79–97)
Platelets: 258 10*3/uL (ref 150–450)
RBC: 4.55 x10E6/uL (ref 3.77–5.28)
RDW: 12.6 % (ref 11.7–15.4)
WBC: 8 10*3/uL (ref 3.4–10.8)

## 2022-02-19 LAB — GLUCOSE TOLERANCE, 2 HOURS W/ 1HR
Glucose, 1 hour: 138 mg/dL (ref 70–179)
Glucose, 2 hour: 93 mg/dL (ref 70–152)
Glucose, Fasting: 84 mg/dL (ref 70–91)

## 2022-07-07 ENCOUNTER — Other Ambulatory Visit: Payer: Self-pay | Admitting: Obstetrics & Gynecology

## 2022-08-01 ENCOUNTER — Ambulatory Visit
Admission: RE | Admit: 2022-08-01 | Discharge: 2022-08-01 | Disposition: A | Discharge status: Home / Self Care | Payer: Medicaid Other | Source: Ambulatory Visit | Attending: Nurse Practitioner | Admitting: Nurse Practitioner

## 2022-08-01 VITALS — BP 121/82 | HR 86 | Temp 98.1°F | Resp 18

## 2022-08-01 DIAGNOSIS — J3489 Other specified disorders of nose and nasal sinuses: Secondary | ICD-10-CM | POA: Diagnosis not present

## 2022-08-01 MED ORDER — MUPIROCIN 2 % EX OINT: 1.0000 | TOPICAL_OINTMENT | Freq: Two times a day (BID) | CUTANEOUS | 0 refills | Status: DC

## 2022-08-01 NOTE — Discharge Instructions (Addendum)
Apply medication as prescribed.  Continue the amoxicillin you are currently taking. May take Tylenol as needed for pain or discomfort. Do not pick or disrupt the areas around the nose. Avoid excessive nose blowing. Cleanse the area to the nose twice daily with an antibacterial soap such as Dial gold bar soap. May use normal saline nasal spray to help with nasal congestion and runny nose. If symptoms do not improve with this treatment, please follow-up with your primary care physician for further evaluation. Follow-up as needed.

## 2022-08-01 NOTE — ED Triage Notes (Addendum)
Pt reports she has blisters around her nose x 1 day Nose started feeling sore then blisters occurred. Pt is also congested and has a cough x 1 week.   Husband has mrsa.   Pt is currently on amoxicillin and mucinex dm from UC visit yesterday

## 2022-08-01 NOTE — ED Provider Notes (Signed)
[facility]    CSN: [id number] Arrival date & time: 08/01/22  1835      History   Chief Complaint Chief Complaint  Patient presents with   Blister    Blistery sore on the inside and outside of my nose. Spreading quickly and painful. - Entered by patient    HPI Cheyenne Moore is a 32 y.o. female.   The history is provided by the patient.   The patient presents for complaints of blisters, pain, and swelling to the nose.  Symptoms started over the past 24 hours.  Patient states that the nose began to feel sore, then the blisters occurred.  She states prior to her symptoms starting, she did have nasal congestion and runny nose.  She also states that she did use Flonase on 1 occasion.  Patient denies fever, chills, or fluctuance, chest pain, abdominal pain, nausea, vomiting, or diarrhea..  She states that she has noticed some clear drainage from the nose, but she is unsure if it was from the blisters or if it was a runny nose.  She reports that she was seen at a local urgent care 1 day ago, and diagnosed with pneumonia.  She was started on amoxicillin for that visit, and advised to take Mucinex.  Patient reports that she is breast-feeding.  Past Medical History:  Diagnosis Date   Allergic rhinitis    GERD (gastroesophageal reflux disease)    Gestational diabetes    metformin   History of gestational diabetes 07/26/2019   Hx A2DM>2hr pp GTT normal; early HgbA1c 5.3   Hypertension    Hypothyroidism    Neck pain 02/07/2019   Thyroid disease    hypothyroid    Patient Active Problem List   Diagnosis Date Noted   Chronic hypertension 07/24/2019   Hypothyroid 02/07/2019   Vitamin D deficiency 02/07/2019   Morbid obesity (HCC) 02/07/2019    Past Surgical History:  Procedure Laterality Date   NO PAST SURGERIES      OB History     Gravida  3   Para  2   Term  2   Preterm  0   AB  1   Living  2      SAB  1   IAB  0   Ectopic  0   Multiple  0    Live Births  2            Home Medications    Prior to Admission medications   Medication Sig Start Date End Date Taking? Authorizing Provider  mupirocin ointment (BACTROBAN) 2 % Apply 1 Application topically 2 (two) times daily. 08/01/22  Yes Sharnese Heath-Warren, Sadie Haber, NP  acetaminophen (TYLENOL) 325 MG tablet Take 2 tablets (650 mg total) by mouth every 4 (four) hours as needed (for pain scale < 4). 12/28/21   Esmeralda Arthur., MD  cholecalciferol (VITAMIN D3) 25 MCG (1000 UNIT) tablet Take 1,000 Units by mouth daily.    [provider]  ferrous sulfate 325 (65 FE) MG tablet Take 1 tablet (325 mg total) by mouth every other day. 12/29/21 01/28/22  Esmeralda Arthur., MD  furosemide (LASIX) 20 MG tablet Take 1 tablet (20 mg total) by mouth daily for 3 days. 12/28/21 12/31/21  Esmeralda Arthur., MD  ibuprofen (ADVIL) 600 MG tablet Take 1 tablet (600 mg total) by mouth every 6 (six) hours. 12/28/21   Esmeralda Arthur., MD  NIFEdipine (PROCARDIA-XL/NIFEDICAL-XL) 30 MG 24 hr tablet TAKE 1 TABLET (  30 MG TOTAL) BY MOUTH IN THE MORNING AND AT BEDTIME. 07/13/22   Lazaro Arms, MD  norethindrone (ORTHO MICRONOR) 0.35 MG tablet Take 1 tablet (0.35 mg total) by mouth daily. 12/28/21 12/28/22  Esmeralda Arthur., MD  omeprazole (PRILOSEC OTC) 20 MG tablet Take 20 mg by mouth daily.    [provider]  thyroid (ARMOUR THYROID) 90 MG tablet Take 1 tablet (90 mg total) by mouth daily before breakfast. 02/18/22   Arabella Merles, CNM    Family History Family History  Problem Relation Age of Onset   Diabetes Mother    Hypertension Mother    Kidney failure Mother    Diabetes Father    Hypertension Father    Heart failure Father    Cancer Maternal Grandmother        kidney   Colon cancer Maternal Grandmother    Hypertension Paternal Grandmother    Heart attack Paternal Grandmother    Cancer Paternal Grandfather        lung    Social History Social History   Tobacco Use   Smoking  status: Never   Smokeless tobacco: Never  Vaping Use   Vaping status: Never Used  Substance Use Topics   Alcohol use: No   Drug use: No     Allergies   Red dye   Review of Systems Review of Systems Per HPI  Physical Exam Triage Vital Signs ED Triage Vitals  Encounter Vitals Group     BP 08/01/22 1903 121/82     Systolic BP Percentile --      Diastolic BP Percentile --      Pulse Rate 08/01/22 1903 86     Resp 08/01/22 1903 18     Temp 08/01/22 1903 98.1 F (36.7 C)     Temp Source 08/01/22 1903 Oral     SpO2 08/01/22 1903 93 %     Weight --      Height --      Head Circumference --      Peak Flow --      Pain Score 08/01/22 1904 4     Pain Loc --      Pain Education --      Exclude from Growth Chart --    No data found.  Updated Vital Signs BP 121/82 (BP Location: Right Arm)   Pulse 86   Temp 98.1 F (36.7 C) (Oral)   Resp 18   SpO2 93%   Breastfeeding Yes   Visual Acuity Right Eye Distance:   Left Eye Distance:   Bilateral Distance:    Right Eye Near:   Left Eye Near:    Bilateral Near:     Physical Exam Vitals and nursing note reviewed.  Constitutional:      General: She is not in acute distress.    Appearance: Normal appearance.  HENT:     Head: Normocephalic.  Eyes:     Extraocular Movements: Extraocular movements intact.     Pupils: Pupils are equal, round, and reactive to light.  Pulmonary:     Effort: Pulmonary effort is normal.  Skin:    General: Skin is warm and dry.     Comments: Blistering noted to the bilateral nares, blisters present as patchy with erythematous base.  Swelling noted to the knee years.  Neurological:     General: No focal deficit present.     Mental Status: She is alert and oriented to person, place, and time.  Psychiatric:  Mood and Affect: Mood normal.        Behavior: Behavior normal.      UC Treatments / Results  Labs (all labs ordered are listed, but only abnormal results are  displayed) Labs Reviewed - No data to display  EKG   Radiology No results found.  Procedures Procedures (including critical care time)  Medications Ordered in UC Medications - No data to display  Initial Impression / Assessment and Plan / UC Course  I have reviewed the triage vital signs and the nursing notes.  Pertinent labs & imaging results that were available during my care of the patient were reviewed by me and considered in my medical decision making (see chart for details).  The patient is well-appearing, she is in no acute distress, vital signs are stable.  Differential diagnoses include vestibulitis of the Neer's, herpes simplex, or viral infection.  Patient is currently taking amoxicillin for possible pneumonia.  Will treat patient with mupirocin ointment.  Patient is currently breast-feeding.  Supportive care recommendations were provided and discussed with the patient to include keeping the area clean and dry, avoid excessive nose blowing, and avoiding picking or disrupting the areas or scabs around the nose.  Patient was advised to cleanse the area twice daily with an antibacterial soap.  Patient can also use normal saline nasal spray as needed for nasal congestion or runny nose.  Patient is in agreement with this plan of care and verbalizes understanding.  All questions were answered.  Patient stable for discharge.   Final Clinical Impressions(s) / UC Diagnoses   Final diagnoses:  Nasal vestibulitis     Discharge Instructions      Apply medication as prescribed.  Continue the amoxicillin you are currently taking. May take Tylenol as needed for pain or discomfort. Do not pick or disrupt the areas around the nose. Avoid excessive nose blowing. Cleanse the area to the nose twice daily with an antibacterial soap such as Dial gold bar soap. May use normal saline nasal spray to help with nasal congestion and runny nose. If symptoms do not improve with this treatment,  please follow-up with your primary care physician for further evaluation. Follow-up as needed.     ED Prescriptions     Medication Sig Dispense Auth. Provider   mupirocin ointment (BACTROBAN) 2 % Apply 1 Application topically 2 (two) times daily. 30 g Kinnley Paulson-Warren, Sadie Haber, NP      PDMP not reviewed this encounter.   Abran Cantor, NP 08/01/22 1952

## 2022-08-15 ENCOUNTER — Ambulatory Visit
Admission: EM | Admit: 2022-08-15 | Discharge: 2022-08-15 | Disposition: A | Discharge status: Home / Self Care | Payer: Medicaid Other | Attending: Urgent Care | Admitting: Urgent Care

## 2022-08-15 DIAGNOSIS — R058 Other specified cough: Secondary | ICD-10-CM

## 2022-08-15 MED ORDER — BENZONATATE 100 MG PO CAPS: ORAL_CAPSULE | ORAL | 0 refills | Status: DC

## 2022-08-15 MED ORDER — PREDNISONE 20 MG PO TABS: ORAL_TABLET | ORAL | 0 refills | Status: AC

## 2022-08-15 NOTE — ED Triage Notes (Signed)
Patient to Urgent Care with complaints of sore throat/ productive cough/ bilateral ear pain. Max temp 100.  Prescribed an abx and Mucinex 7/14. Symptoms returned over the last week.   Taking delsym/ tylenol/ chloraseptic spray.

## 2022-08-15 NOTE — Discharge Instructions (Signed)
Follow up here or with your primary care provider if your symptoms are worsening or not improving with treatment.     

## 2022-08-15 NOTE — ED Provider Notes (Addendum)
Cheyenne Moore    CSN: [id number] Arrival date & time: 08/15/22  1841      History   Chief Complaint Chief Complaint  Patient presents with   Cough   Otalgia    HPI Cheyenne Moore is a 32 y.o. female.    Cough Associated symptoms: ear pain   Otalgia Associated symptoms: cough    Presents to urgent care with complaint of sore throat, productive cough, bilateral otalgia.  Tmax 100 F.  Patient states she was prescribed antibiotic on 07/31/2022 (15 days ago).  She states symptoms returned last week.  Patient's chart indicates visit on 08/01/2022 for nasal vestibulitis and treated with mupirocin.  Patient referenced treatment for pneumonia at [facility] via telehealth with "amoxicillin" on the prior day.   Past Medical History:  Diagnosis Date   Allergic rhinitis    GERD (gastroesophageal reflux disease)    Gestational diabetes    metformin   History of gestational diabetes 07/26/2019   Hx A2DM>2hr pp GTT normal; early HgbA1c 5.3   Hypertension    Hypothyroidism    Neck pain 02/07/2019   Thyroid disease    hypothyroid    Patient Active Problem List   Diagnosis Date Noted   Chronic hypertension 07/24/2019   Hypothyroid 02/07/2019   Vitamin D deficiency 02/07/2019   Morbid obesity (HCC) 02/07/2019    Past Surgical History:  Procedure Laterality Date   NO PAST SURGERIES      OB History     Gravida  3   Para  2   Term  2   Preterm  0   AB  1   Living  2      SAB  1   IAB  0   Ectopic  0   Multiple  0   Live Births  2            Home Medications    Prior to Admission medications   Medication Sig Start Date End Date Taking? Authorizing Provider  acetaminophen (TYLENOL) 325 MG tablet Take 2 tablets (650 mg total) by mouth every 4 (four) hours as needed (for pain scale < 4). 12/28/21   Esmeralda Arthur., MD  cholecalciferol (VITAMIN D3) 25 MCG (1000 UNIT) tablet Take 1,000 Units by mouth daily.    [provider]   ferrous sulfate 325 (65 FE) MG tablet Take 1 tablet (325 mg total) by mouth every other day. 12/29/21 01/28/22  Esmeralda Arthur., MD  furosemide (LASIX) 20 MG tablet Take 1 tablet (20 mg total) by mouth daily for 3 days. Patient not taking: Reported on 08/15/2022 12/28/21 12/31/21  Esmeralda Arthur., MD  ibuprofen (ADVIL) 600 MG tablet Take 1 tablet (600 mg total) by mouth every 6 (six) hours. 12/28/21   Esmeralda Arthur., MD  mupirocin ointment (BACTROBAN) 2 % Apply 1 Application topically 2 (two) times daily. 08/01/22   Leath-Warren, Sadie Haber, NP  NIFEdipine (PROCARDIA-XL/NIFEDICAL-XL) 30 MG 24 hr tablet TAKE 1 TABLET (30 MG TOTAL) BY MOUTH IN THE MORNING AND AT BEDTIME. Patient not taking: Reported on 08/15/2022 07/13/22   Lazaro Arms, MD  norethindrone (ORTHO MICRONOR) 0.35 MG tablet Take 1 tablet (0.35 mg total) by mouth daily. 12/28/21 12/28/22  Esmeralda Arthur., MD  omeprazole (PRILOSEC OTC) 20 MG tablet Take 20 mg by mouth daily.    [provider]  thyroid (ARMOUR THYROID) 90 MG tablet Take 1 tablet (90 mg total) by mouth daily before breakfast. 02/18/22  Arabella Merles, CNM    Family History Family History  Problem Relation Age of Onset   Diabetes Mother    Hypertension Mother    Kidney failure Mother    Diabetes Father    Hypertension Father    Heart failure Father    Cancer Maternal Grandmother        kidney   Colon cancer Maternal Grandmother    Hypertension Paternal Grandmother    Heart attack Paternal Grandmother    Cancer Paternal Grandfather        lung    Social History Social History   Tobacco Use   Smoking status: Never   Smokeless tobacco: Never  Vaping Use   Vaping status: Never Used  Substance Use Topics   Alcohol use: No   Drug use: No     Allergies   Red dye   Review of Systems Review of Systems  HENT:  Positive for ear pain.   Respiratory:  Positive for cough.      Physical Exam Triage Vital Signs ED Triage Vitals  Encounter  Vitals Group     BP 08/15/22 1902 117/83     Systolic BP Percentile --      Diastolic BP Percentile --      Pulse Rate 08/15/22 1902 77     Resp 08/15/22 1902 18     Temp 08/15/22 1902 97.7 F (36.5 C)     Temp src --      SpO2 08/15/22 1902 98 %     Weight --      Height --      Head Circumference --      Peak Flow --      Pain Score 08/15/22 1848 4     Pain Loc --      Pain Education --      Exclude from Growth Chart --    No data found.  Updated Vital Signs BP 117/83   Pulse 77   Temp 97.7 F (36.5 C)   Resp 18   SpO2 98%   Breastfeeding Yes   Visual Acuity Right Eye Distance:   Left Eye Distance:   Bilateral Distance:    Right Eye Near:   Left Eye Near:    Bilateral Near:     Physical Exam   UC Treatments / Results  Labs (all labs ordered are listed, but only abnormal results are displayed) Labs Reviewed - No data to display  EKG   Radiology No results found.  Procedures Procedures (including critical care time)  Medications Ordered in UC Medications - No data to display  Initial Impression / Assessment and Plan / UC Course  I have reviewed the triage vital signs and the nursing notes.  Pertinent labs & imaging results that were available during my care of the patient were reviewed by me and considered in my medical decision making (see chart for details).   [name] is a 33 y.o. female presenting with URI with cough. Patient is afebrile without recent antipyretics, satting well on room air. Overall is well appearing, well hydrated, without respiratory distress. Pulmonary exam is unremarkable.  Lungs CTAB without wheezing, rhonchi, rales. RRR without murmurs, rubs, gallops.  No pharyngeal erythema or peritonsillar exudates.  TMs are WNL bilaterally.  Reviewed relevant chart history.   Cheyenne Moore what medication patient has recently completed for pneumonia, likely amoxicillin 875 or amox-clav 875.  Since she has no red flag symptoms, and no  sign of a bacterial  infection (negative sinusitis, negative strep pharyngitis, negative AOM), will treat her symptoms with cough suppression using benzonatate and prednisone.  Patient is breast-feeding and both medications should be fine.  Counseled patient on potential for adverse effects with medications prescribed/recommended today, ER and return-to-clinic precautions discussed, patient verbalized understanding and agreement with care plan.  Final Clinical Impressions(s) / UC Diagnoses   Final diagnoses:  None   Discharge Instructions   None    ED Prescriptions   None    PDMP not reviewed this encounter.   Charma Igo, FNP 08/15/22 1922    Charma Igo, FNP 08/15/22 1923    Charma Igo, FNP 08/15/22 1931

## 2022-10-26 ENCOUNTER — Other Ambulatory Visit: Payer: Self-pay | Admitting: Adult Health

## 2022-11-21 ENCOUNTER — Encounter: Payer: Self-pay | Admitting: Obstetrics & Gynecology

## 2022-11-21 ENCOUNTER — Other Ambulatory Visit: Payer: Self-pay | Admitting: Women's Health

## 2022-11-21 MED ORDER — NORETHINDRONE 0.35 MG PO TABS: 1.0000 | ORAL_TABLET | Freq: Every day | ORAL | 3 refills | Status: DC

## 2022-12-21 ENCOUNTER — Ambulatory Visit: Payer: Medicaid Other

## 2022-12-21 VITALS — BP 120/79 | HR 81 | Ht 66.0 in | Wt 285.0 lb

## 2022-12-21 DIAGNOSIS — Z3201 Encounter for pregnancy test, result positive: Secondary | ICD-10-CM

## 2022-12-21 LAB — POCT URINE PREGNANCY: Preg Test, Ur: POSITIVE — AB

## 2022-12-21 NOTE — Progress Notes (Signed)
   NURSE VISIT- PREGNANCY CONFIRMATION   SUBJECTIVE:  Cheyenne Moore is a 32 y.o. [phone] female at [redacted]w[redacted]d by certain LMP of Patient's last menstrual period was 11/15/2022 (exact date). Here for pregnancy confirmation.  Home pregnancy test: positive x 2   She reports cramping.  She is taking prenatal vitamins.    OBJECTIVE:  BP 120/79 (BP Location: Left Arm, Patient Position: Sitting, Cuff Size: Large)   Pulse 81   Ht 5\' 6"  (1.676 m)   Wt 285 lb (129.3 kg)   LMP 11/15/2022 (Exact Date)   Breastfeeding Yes   BMI 46.00 kg/m   Appears well, in no apparent distress  Results for orders placed or performed in visit on 12/21/22 (from the past 24 hour(s))  POCT urine pregnancy   Collection Time: 12/21/22  3:20 PM  Result Value Ref Range   Preg Test, Ur Positive (A) Negative    ASSESSMENT: Positive pregnancy test, [redacted]w[redacted]d by LMP    PLAN: Schedule for dating ultrasound in 2 weeks Prenatal vitamins: continue   Nausea medicines: not currently needed   OB packet given: Yes  Caralyn Guile  12/21/2022 3:25 PM

## 2023-01-03 ENCOUNTER — Other Ambulatory Visit: Payer: Self-pay | Admitting: Obstetrics & Gynecology

## 2023-01-03 DIAGNOSIS — O3680X0 Pregnancy with inconclusive fetal viability, not applicable or unspecified: Secondary | ICD-10-CM

## 2023-01-04 ENCOUNTER — Other Ambulatory Visit: Payer: Medicaid Other

## 2023-01-04 DIAGNOSIS — Z3491 Encounter for supervision of normal pregnancy, unspecified, first trimester: Secondary | ICD-10-CM

## 2023-01-04 DIAGNOSIS — Z3A01 Less than 8 weeks gestation of pregnancy: Secondary | ICD-10-CM | POA: Diagnosis not present

## 2023-01-04 DIAGNOSIS — O3680X0 Pregnancy with inconclusive fetal viability, not applicable or unspecified: Secondary | ICD-10-CM

## 2023-01-04 NOTE — Progress Notes (Signed)
[place] 7+1 wks,single IUP with yolk sac,FHR 146 bpm,CRL 11.25 mm,normal ovaries

## 2023-01-18 NOTE — L&D Delivery Note (Signed)
 OB/GYN Faculty Practice Delivery Note  Cheyenne Moore is a 33 y.o. [id number] s/p SVD at [redacted]w[redacted]d. She was admitted for IOL for cHTN.   ROM: 16h 43m with meconium stained fluid GBS Status: Neg Maximum Maternal Temperature: 98.2  Labor Progress: IOL begun with Cytotec , then Pitocin  with AROM.  Delivery Date/Time: 08/18/23 at 1125 Delivery: Called to room and patient was complete and pushing. Head delivered OA. No nuchal cord present. Shoulder and body delivered in usual fashion. Infant with spontaneous cry, placed on mother's abdomen, dried and stimulated. Cord clamped x 2 after 1-minute delay, and cut by FOB. Cord blood drawn. Placenta delivered spontaneously, intact, with 3-vessel cord. Fundus firm with massage and Pitocin . Labia, perineum, vagina, and cervix inspected, no laceration found.   Placenta: Spontaneous, intact, sent to L&D for disposal Complications: None Lacerations: None EBL: 220 Analgesia: Epidural  Postpartum Planning [x]  transfer orders to MB [x]  discharge summary started & shared [x]  message to sent to schedule follow-up  [x]  lists updated  Infant: Girl  APGARs 8/9  3660g  Cornell Finder, CNM, IBCLC Certified Nurse Midwife, [facility] for [employer], [facility] 08/18/2023, 7:57 PM

## 2023-02-06 ENCOUNTER — Other Ambulatory Visit: Payer: Self-pay | Admitting: Obstetrics & Gynecology

## 2023-02-06 DIAGNOSIS — Z3682 Encounter for antenatal screening for nuchal translucency: Secondary | ICD-10-CM

## 2023-02-07 ENCOUNTER — Encounter: Payer: Medicaid Other | Admitting: *Deleted

## 2023-02-07 ENCOUNTER — Ambulatory Visit (INDEPENDENT_AMBULATORY_CARE_PROVIDER_SITE_OTHER): Payer: Medicaid Other | Admitting: Women's Health

## 2023-02-07 ENCOUNTER — Encounter: Payer: Self-pay | Admitting: Women's Health

## 2023-02-07 ENCOUNTER — Ambulatory Visit: Payer: Medicaid Other

## 2023-02-07 VITALS — BP 118/76 | HR 66 | Wt 280.0 lb

## 2023-02-07 DIAGNOSIS — I10 Essential (primary) hypertension: Secondary | ICD-10-CM

## 2023-02-07 DIAGNOSIS — Z3682 Encounter for antenatal screening for nuchal translucency: Secondary | ICD-10-CM | POA: Diagnosis not present

## 2023-02-07 DIAGNOSIS — Z1332 Encounter for screening for maternal depression: Secondary | ICD-10-CM | POA: Diagnosis not present

## 2023-02-07 DIAGNOSIS — Z8632 Personal history of gestational diabetes: Secondary | ICD-10-CM

## 2023-02-07 DIAGNOSIS — Z131 Encounter for screening for diabetes mellitus: Secondary | ICD-10-CM | POA: Diagnosis not present

## 2023-02-07 DIAGNOSIS — O09291 Supervision of pregnancy with other poor reproductive or obstetric history, first trimester: Secondary | ICD-10-CM

## 2023-02-07 DIAGNOSIS — O0991 Supervision of high risk pregnancy, unspecified, first trimester: Secondary | ICD-10-CM

## 2023-02-07 DIAGNOSIS — O09299 Supervision of pregnancy with other poor reproductive or obstetric history, unspecified trimester: Secondary | ICD-10-CM | POA: Insufficient documentation

## 2023-02-07 DIAGNOSIS — O0990 Supervision of high risk pregnancy, unspecified, unspecified trimester: Secondary | ICD-10-CM | POA: Insufficient documentation

## 2023-02-07 DIAGNOSIS — Z23 Encounter for immunization: Secondary | ICD-10-CM | POA: Diagnosis not present

## 2023-02-07 DIAGNOSIS — Z3A12 12 weeks gestation of pregnancy: Secondary | ICD-10-CM

## 2023-02-07 DIAGNOSIS — Z6841 Body Mass Index (BMI) 40.0 and over, adult: Secondary | ICD-10-CM

## 2023-02-07 DIAGNOSIS — E038 Other specified hypothyroidism: Secondary | ICD-10-CM

## 2023-02-07 MED ORDER — PROMETHAZINE HCL 25 MG PO TABS: 12.5000 mg | ORAL_TABLET | Freq: Four times a day (QID) | ORAL | 6 refills | Status: DC | PRN

## 2023-02-07 MED ORDER — ASPIRIN 81 MG PO TBEC: 162.0000 mg | DELAYED_RELEASE_TABLET | Freq: Every day | ORAL | 2 refills | Status: DC

## 2023-02-07 NOTE — Progress Notes (Signed)
INITIAL OBSTETRICAL VISIT Patient name: Cheyenne Moore MRN [id number]  Date of birth: [birth year] Chief Complaint:   No chief complaint on file.  History of Present Illness:   Cheyenne Moore is a 33 y.o. [phone] Caucasian female at [redacted]w[redacted]d by LMP c/w u/s at 7 weeks with an Estimated Date of Delivery: 08/22/23 being seen today for her initial obstetrical visit.   Patient's last menstrual period was 11/15/2022 (exact date). Her obstetrical history is significant for  SAB x 1, SVB x 2, A2DM w/ both .   CHTN- no meds Hypothyroidism on Armour thyroid 90mg , hasn't been taking daily as directed lately Today she reports nausea-requests prn rx.  Last pap 02/02/21. Results were: NILM w/ HRHPV negative     02/07/2023    3:11 PM 07/02/2021    9:00 AM 02/02/2021    2:57 PM 07/24/2019    9:08 AM 04/11/2019    9:08 AM  Depression screen PHQ 2/9  Decreased Interest 1 0 0 0 0  Down, Depressed, Hopeless 1 0 0 0 0  PHQ - 2 Score 2 0 0 0 0  Altered sleeping 0 0 0 0 0  Tired, decreased energy 1 2 1 1  0  Change in appetite 0 0 1 0 0  Feeling bad or failure about yourself  0 0 0 0 0  Trouble concentrating 0 0 0 0 0  Moving slowly or fidgety/restless 0 0 0 0 0  Suicidal thoughts 0 0 0 0 0  PHQ-9 Score 3 2 2 1  0        02/07/2023    3:11 PM 07/02/2021    9:00 AM 02/02/2021    2:57 PM 07/24/2019    9:08 AM  GAD 7 : Generalized Anxiety Score  Nervous, Anxious, on Edge 1 2 1  0  Control/stop worrying 0 1 0 0  Worry too much - different things 1 1 0 0  Trouble relaxing 0 0 0 0  Restless 0 0 0 0  Easily annoyed or irritable 1 1 0 0  Afraid - awful might happen 0 1 0 0  Total GAD 7 Score 3 6 1  0     Review of Systems:   Pertinent items are noted in HPI Denies cramping/contractions, leakage of fluid, vaginal bleeding, abnormal vaginal discharge w/ itching/odor/irritation, headaches, visual changes, shortness of breath, chest pain, abdominal pain, severe nausea/vomiting, or problems with urination or  bowel movements unless otherwise stated above.  Pertinent History Reviewed:  Reviewed past medical,surgical, social, obstetrical and family history.  Reviewed problem list, medications and allergies. OB History  Gravida Para Term Preterm AB Living  4 2 2  0 1 2  SAB IAB Ectopic Multiple Live Births  1 0 0 0 2    # Outcome Date GA Lbr Len/2nd Weight Sex Type Anes PTL Lv  4 Current           3 Term 12/26/21 [redacted]w[redacted]d 05:21 / 01:29 8 lb 2.2 oz (3.69 kg) M Vag-Spont EPI N LIV     Complications: Chronic hypertension, Gestational diabetes  2 Term 10/21/19 [redacted]w[redacted]d 12:38 / 02:08 7 lb 13 oz (3.544 kg) F Vag-Spont EPI N LIV     Birth Comments: WDL     Complications: Gestational diabetes, Chronic hypertension  1 SAB 07/01/18 [redacted]w[redacted]d            Birth Comments: 9.4 week sized fetus   Physical Assessment:   Vitals:   02/07/23 1514  BP: 118/76  Pulse:  66  Weight: 280 lb (127 kg)  Body mass index is 45.19 kg/m.       Physical Examination:  General appearance - well appearing, and in no distress  Mental status - alert, oriented to person, place, and time  Psych:  She has a normal mood and affect  Skin - warm and dry, normal color, no suspicious lesions noted  Chest - effort normal, all lung fields clear to auscultation bilaterally  Heart - normal rate and regular rhythm  Abdomen - soft, nontender  Extremities:  No swelling or varicosities noted  Thin prep pap is not done   Chaperone: N/A    TODAY'S NT [place] 12 wks,measurements c/w dates,FHR 161 bpm,NB present,NT 1.3 mm,posterior placenta,normal ovaries,limited view because of patients body habitus,unable to visualize orbits because of fetal position   No results found for this or any previous visit (from the past 24 hours).  Assessment & Plan:  1) High-Risk Pregnancy [id number] at [redacted]w[redacted]d with an Estimated Date of Delivery: 08/22/23   2) Initial OB visit  3) CHTN> no meds, ASA, baseline labs  4) Hypothyroidism> on Armour thyroid 90mg , hasn't been  taking daily as directed lately. TSH today  5) H/O GDM x2> A1C today  6) PG BMI 46  7) Nausea> wants prn rx, rx phenergan  Meds:  Meds ordered this encounter  Medications   aspirin EC 81 MG tablet    Sig: Take 2 tablets (162 mg total) by mouth daily. Swallow whole.    Dispense:  180 tablet    Refill:  2   promethazine (PHENERGAN) 25 MG tablet    Sig: Take 0.5-1 tablets (12.5-25 mg total) by mouth every 6 (six) hours as needed for nausea or vomiting.    Dispense:  30 tablet    Refill:  6    Initial labs obtained Continue prenatal vitamins Reviewed n/v relief measures and warning s/s to report Reviewed recommended weight gain based on pre-gravid BMI Encouraged well-balanced diet Genetic & carrier screening discussed: requests Panorama and NT/IT, Horizon neg prev preg Ultrasound discussed; fetal survey: requested CCNC completed> form faxed if has or is planning to apply for medicaid The nature of [place] [facility] [employer] with multiple MDs and other Advanced Practice Providers was explained to patient; also emphasized that fellows, residents, and students are part of our team. Does have home bp cuff. Office bp cuff given: no. Rx sent: n/a. Check bp weekly, let us know if consistently >140/90.   Follow-up: Return in about 4 weeks (around 03/07/2023) for HROB, 2nd IT, MD or CNM, in person; then 8wks from now anatomy u/s and HROB w/ MD/CNM.   Orders Placed This Encounter  Procedures   Urine Culture   GC/Chlamydia Probe Amp   Flu vaccine trivalent PF, 6mos and older(Flulaval,Afluria,Fluarix,Fluzone)   Integrated 1   CBC/D/Plt+RPR+Rh+ABO+RubIgG...   Comprehensive metabolic panel   Protein / creatinine ratio, urine   Hemoglobin A1c   TSH   PANORAMA PRENATAL TEST    Cheral Marker CNM, [facility] 02/07/2023 4:01 PM

## 2023-02-07 NOTE — Progress Notes (Signed)
[place] 12 wks,measurements c/w dates,FHR 161 bpm,NB present,NT 1.3 mm,posterior placenta,normal ovaries,limited view because of patients body habitus

## 2023-02-07 NOTE — Patient Instructions (Signed)
Cheyenne Moore, thank you for choosing our office today! We appreciate the opportunity to meet your healthcare needs. You may receive a short survey by mail, e-mail, or through MyChart. If you are happy with your care we would appreciate if you could take just a few minutes to complete the survey questions. We read all of your comments and take your feedback very seriously. Thank you again for choosing our office.  [facility] ([address] [place], [place] [address]) Entrance C, located off of [address] Free 24/7 valet parking   Nausea & Vomiting Have saltine crackers or pretzels by your bed and eat a few bites before you raise your head out of bed in the morning Eat small frequent meals throughout the day instead of large meals Drink plenty of fluids throughout the day to stay hydrated, just don't drink a lot of fluids with your meals.  This can make your stomach fill up faster making you feel sick Do not brush your teeth right after you eat Products with real ginger are good for nausea, like ginger ale and ginger hard candy Make sure it says made with real ginger! Sucking on sour candy like lemon heads is also good for nausea If your prenatal vitamins make you nauseated, take them at night so you will sleep through the nausea Sea Bands If you feel like you need medicine for the nausea & vomiting please let us know If you are unable to keep any fluids or food down please let us know   Constipation Drink plenty of fluid, preferably water, throughout the day Eat foods high in fiber such as fruits, vegetables, and grains Exercise, such as walking, is a good way to keep your bowels regular Drink warm fluids, especially warm prune juice, or decaf coffee Eat a 1/2 cup of real oatmeal (not instant), 1/2 cup applesauce, and 1/2-1 cup warm prune juice every day If needed, you may take Colace (docusate sodium) stool softener  once or twice a day to help keep the stool soft.  If you still are having problems with constipation, you may take Miralax once daily as needed to help keep your bowels regular.   Home Blood Pressure Monitoring for Patients   Your provider has recommended that you check your blood pressure (BP) at least once a week at home. If you do not have a blood pressure cuff at home, one will be provided for you. Contact your provider if you have not received your monitor within 1 week.   Helpful Tips for Accurate Home Blood Pressure Checks  Don't smoke, exercise, or drink caffeine 30 minutes before checking your BP Use the restroom before checking your BP (a full bladder can raise your pressure) Relax in a comfortable upright chair Feet on the ground Left arm resting comfortably on a flat surface at the level of your heart Legs uncrossed Back supported Sit quietly and don't talk Place the cuff on your bare arm Adjust snuggly, so that only two fingertips can fit between your skin and the top of the cuff Check 2 readings separated by at least one minute Keep a log of your BP readings For a visual, please reference this diagram: http://ccnc.care/bpdiagram  Provider Name: [facility]     Phone: [phone]  Zone 1: ALL CLEAR  Continue to monitor your symptoms:  BP reading is less than 140 (top number) or less than 90 (bottom   number)  No right upper stomach pain No headaches or seeing spots No feeling nauseated or throwing up No swelling in face and hands  Zone 2: CAUTION Call your doctor's office for any of the following:  BP reading is greater than 140 (top number) or greater than 90 (bottom number)  Stomach pain under your ribs in the middle or right side Headaches or seeing spots Feeling nauseated or throwing up Swelling in face and hands  Zone 3: EMERGENCY  Seek immediate medical care if you have any of the following:  BP reading is greater than160 (top number) or greater than  110 (bottom number) Severe headaches not improving with Tylenol Serious difficulty catching your breath Any worsening symptoms from Zone 2    First Trimester of Pregnancy The first trimester of pregnancy is from week 1 until the end of week 12 (months 1 through 3). A week after a sperm fertilizes an egg, the egg will implant on the wall of the uterus. This embryo will begin to develop into a baby. Genes from you and your partner are forming the baby. The female genes determine whether the baby is a boy or a girl. At 6-8 weeks, the eyes and face are formed, and the heartbeat can be seen on ultrasound. At the end of 12 weeks, all the baby's organs are formed.  Now that you are pregnant, you will want to do everything you can to have a healthy baby. Two of the most important things are to get good prenatal care and to follow your health care provider's instructions. Prenatal care is all the medical care you receive before the baby's birth. This care will help prevent, find, and treat any problems during the pregnancy and childbirth. BODY CHANGES Your body goes through many changes during pregnancy. The changes vary from woman to woman.  You may gain or lose a couple of pounds at first. You may feel sick to your stomach (nauseous) and throw up (vomit). If the vomiting is uncontrollable, call your health care provider. You may tire easily. You may develop headaches that can be relieved by medicines approved by your health care provider. You may urinate more often. Painful urination may mean you have a bladder infection. You may develop heartburn as a result of your pregnancy. You may develop constipation because certain hormones are causing the muscles that push waste through your intestines to slow down. You may develop hemorrhoids or swollen, bulging veins (varicose veins). Your breasts may begin to grow larger and become tender. Your nipples may stick out more, and the tissue that surrounds them  (areola) may become darker. Your gums may bleed and may be sensitive to brushing and flossing. Dark spots or blotches (chloasma, mask of pregnancy) may develop on your face. This will likely fade after the baby is born. Your menstrual periods will stop. You may have a loss of appetite. You may develop cravings for certain kinds of food. You may have changes in your emotions from day to day, such as being excited to be pregnant or being concerned that something may go wrong with the pregnancy and baby. You may have more vivid and strange dreams. You may have changes in your hair. These can include thickening of your hair, rapid growth, and changes in texture. Some women also have hair loss during or after pregnancy, or hair that feels dry or thin. Your hair will most likely return to normal after your baby is born. WHAT TO EXPECT AT YOUR PRENATAL   VISITS During a routine prenatal visit: You will be weighed to make sure you and the baby are growing normally. Your blood pressure will be taken. Your abdomen will be measured to track your baby's growth. The fetal heartbeat will be listened to starting around week 10 or 12 of your pregnancy. Test results from any previous visits will be discussed. Your health care provider may ask you: How you are feeling. If you are feeling the baby move. If you have had any abnormal symptoms, such as leaking fluid, bleeding, severe headaches, or abdominal cramping. If you have any questions. Other tests that may be performed during your first trimester include: Blood tests to find your blood type and to check for the presence of any previous infections. They will also be used to check for low iron levels (anemia) and Rh antibodies. Later in the pregnancy, blood tests for diabetes will be done along with other tests if problems develop. Urine tests to check for infections, diabetes, or protein in the urine. An ultrasound to confirm the proper growth and development  of the baby. An amniocentesis to check for possible genetic problems. Fetal screens for spina bifida and Down syndrome. You may need other tests to make sure you and the baby are doing well. HOME CARE INSTRUCTIONS  Medicines Follow your health care provider's instructions regarding medicine use. Specific medicines may be either safe or unsafe to take during pregnancy. Take your prenatal vitamins as directed. If you develop constipation, try taking a stool softener if your health care provider approves. Diet Eat regular, well-balanced meals. Choose a variety of foods, such as meat or vegetable-based protein, fish, milk and low-fat dairy products, vegetables, fruits, and whole grain breads and cereals. Your health care provider will help you determine the amount of weight gain that is right for you. Avoid raw meat and uncooked cheese. These carry germs that can cause birth defects in the baby. Eating four or five small meals rather than three large meals a day may help relieve nausea and vomiting. If you start to feel nauseous, eating a few soda crackers can be helpful. Drinking liquids between meals instead of during meals also seems to help nausea and vomiting. If you develop constipation, eat more high-fiber foods, such as fresh vegetables or fruit and whole grains. Drink enough fluids to keep your urine clear or pale yellow. Activity and Exercise Exercise only as directed by your health care provider. Exercising will help you: Control your weight. Stay in shape. Be prepared for labor and delivery. Experiencing pain or cramping in the lower abdomen or low back is a good sign that you should stop exercising. Check with your health care provider before continuing normal exercises. Try to avoid standing for long periods of time. Move your legs often if you must stand in one place for a long time. Avoid heavy lifting. Wear low-heeled shoes, and practice good posture. You may continue to have sex  unless your health care provider directs you otherwise. Relief of Pain or Discomfort Wear a good support bra for breast tenderness.   Take warm sitz baths to soothe any pain or discomfort caused by hemorrhoids. Use hemorrhoid cream if your health care provider approves.   Rest with your legs elevated if you have leg cramps or low back pain. If you develop varicose veins in your legs, wear support hose. Elevate your feet for 15 minutes, 3-4 times a day. Limit salt in your diet. Prenatal Care Schedule your prenatal visits by the   twelfth week of pregnancy. They are usually scheduled monthly at first, then more often in the last 2 months before delivery. Write down your questions. Take them to your prenatal visits. Keep all your prenatal visits as directed by your health care provider. Safety Wear your seat belt at all times when driving. Make a list of emergency phone numbers, including numbers for family, friends, the hospital, and police and fire departments. General Tips Ask your health care provider for a referral to a local prenatal education class. Begin classes no later than at the beginning of month 6 of your pregnancy. Ask for help if you have counseling or nutritional needs during pregnancy. Your health care provider can offer advice or refer you to specialists for help with various needs. Do not use hot tubs, steam rooms, or saunas. Do not douche or use tampons or scented sanitary pads. Do not cross your legs for long periods of time. Avoid cat litter boxes and soil used by cats. These carry germs that can cause birth defects in the baby and possibly loss of the fetus by miscarriage or stillbirth. Avoid all smoking, herbs, alcohol, and medicines not prescribed by your health care provider. Chemicals in these affect the formation and growth of the baby. Schedule a dentist appointment. At home, brush your teeth with a soft toothbrush and be gentle when you floss. SEEK MEDICAL CARE IF:   You have dizziness. You have mild pelvic cramps, pelvic pressure, or nagging pain in the abdominal area. You have persistent nausea, vomiting, or diarrhea. You have a bad smelling vaginal discharge. You have pain with urination. You notice increased swelling in your face, hands, legs, or ankles. SEEK IMMEDIATE MEDICAL CARE IF:  You have a fever. You are leaking fluid from your vagina. You have spotting or bleeding from your vagina. You have severe abdominal cramping or pain. You have rapid weight gain or loss. You vomit blood or material that looks like coffee grounds. You are exposed to German measles and have never had them. You are exposed to fifth disease or chickenpox. You develop a severe headache. You have shortness of breath. You have any kind of trauma, such as from a fall or a car accident. Document Released: 12/28/2000 Document Revised: 05/20/2013 Document Reviewed: 11/13/2012 ExitCare Patient Information 2015 [employer]. This information is not intended to replace advice given to you by your health care provider. Make sure you discuss any questions you have with your health care provider.  

## 2023-02-08 LAB — INTEGRATED 1

## 2023-02-09 LAB — GC/CHLAMYDIA PROBE AMP
Chlamydia trachomatis, NAA: NEGATIVE
Neisseria Gonorrhoeae by PCR: NEGATIVE

## 2023-02-09 LAB — URINE CULTURE

## 2023-02-10 LAB — INTEGRATED 1
Crown Rump Length: 59 mm
Gest. Age on Collection Date: 12.3 wk
Maternal Age at EDD: 31.4 a
Nuchal Translucency (NT): 1.3 mm
Number of Fetuses: 1
PAPP-A Value: 549 ng/mL
Sonographer ID#: 309760
Weight: 280 [lb_av]

## 2023-02-10 LAB — COMPREHENSIVE METABOLIC PANEL
ALT: 15 [IU]/L (ref 0–32)
AST: 16 [IU]/L (ref 0–40)
Albumin: 4.4 g/dL (ref 4.0–5.0)
Alkaline Phosphatase: 76 [IU]/L (ref 44–121)
BUN/Creatinine Ratio: 12 (ref 9–23)
BUN: 7 mg/dL (ref 6–20)
Bilirubin Total: 0.5 mg/dL (ref 0.0–1.2)
CO2: 19 mmol/L — ABNORMAL LOW (ref 20–29)
Calcium: 10 mg/dL (ref 8.7–10.2)
Chloride: 101 mmol/L (ref 96–106)
Creatinine, Ser: 0.6 mg/dL (ref 0.57–1.00)
Globulin, Total: 2.6 g/dL (ref 1.5–4.5)
Glucose: 80 mg/dL (ref 70–99)
Potassium: 4.1 mmol/L (ref 3.5–5.2)
Sodium: 136 mmol/L (ref 134–144)
Total Protein: 7 g/dL (ref 6.0–8.5)
eGFR: 124 mL/min/{1.73_m2} (ref >59)

## 2023-02-10 LAB — CBC/D/PLT+RPR+RH+ABO+RUBIGG...
Antibody Screen: NEGATIVE
Basophils Absolute: 0 10*3/uL (ref 0.0–0.2)
Basos: 0 %
EOS (ABSOLUTE): 0.2 10*3/uL (ref 0.0–0.4)
Eos: 2 %
HCV Ab: NONREACTIVE
HIV Screen 4th Generation wRfx: NONREACTIVE
Hematocrit: 42.9 % (ref 34.0–46.6)
Hemoglobin: 14.5 g/dL (ref 11.1–15.9)
Hepatitis B Surface Ag: NEGATIVE
Immature Grans (Abs): 0 10*3/uL (ref 0.0–0.1)
Immature Granulocytes: 0 %
Lymphocytes Absolute: 1.6 10*3/uL (ref 0.7–3.1)
Lymphs: 19 %
MCH: 30 pg (ref 26.6–33.0)
MCHC: 33.8 g/dL (ref 31.5–35.7)
MCV: 89 fL (ref 79–97)
Monocytes Absolute: 0.5 10*3/uL (ref 0.1–0.9)
Monocytes: 6 %
Neutrophils Absolute: 6.1 10*3/uL (ref 1.4–7.0)
Neutrophils: 73 %
Platelets: 257 10*3/uL (ref 150–450)
RBC: 4.83 x10E6/uL (ref 3.77–5.28)
RDW: 11.7 % (ref 11.7–15.4)
RPR Ser Ql: NONREACTIVE
Rh Factor: POSITIVE
Rubella Antibodies, IGG: 1.06 {index} (ref >0.99)
WBC: 8.4 10*3/uL (ref 3.4–10.8)

## 2023-02-10 LAB — PROTEIN / CREATININE RATIO, URINE
Creatinine, Urine: 159.2 mg/dL
Protein, Ur: 14.4 mg/dL
Protein/Creat Ratio: 90 mg/g{creat} (ref 0–200)

## 2023-02-10 LAB — HEMOGLOBIN A1C
Est. average glucose Bld gHb Est-mCnc: 105 mg/dL
Hgb A1c MFr Bld: 5.3 % (ref 4.8–5.6)

## 2023-02-10 LAB — TSH: TSH: 3.79 u[IU]/mL (ref 0.450–4.500)

## 2023-02-10 LAB — HCV INTERPRETATION

## 2023-02-14 ENCOUNTER — Encounter: Payer: Self-pay | Admitting: Women's Health

## 2023-02-14 LAB — PANORAMA PRENATAL TEST FULL PANEL:PANORAMA TEST PLUS 5 ADDITIONAL MICRODELETIONS: FETAL FRACTION: 6.4

## 2023-03-07 ENCOUNTER — Encounter: Payer: Medicaid Other | Admitting: Women's Health

## 2023-03-14 ENCOUNTER — Ambulatory Visit: Payer: Medicaid Other | Admitting: Women's Health

## 2023-03-14 ENCOUNTER — Encounter: Payer: Self-pay | Admitting: Women's Health

## 2023-03-14 VITALS — BP 119/78 | HR 77 | Wt 288.0 lb

## 2023-03-14 DIAGNOSIS — O0992 Supervision of high risk pregnancy, unspecified, second trimester: Secondary | ICD-10-CM | POA: Diagnosis not present

## 2023-03-14 DIAGNOSIS — Z6841 Body Mass Index (BMI) 40.0 and over, adult: Secondary | ICD-10-CM

## 2023-03-14 DIAGNOSIS — I10 Essential (primary) hypertension: Secondary | ICD-10-CM

## 2023-03-14 DIAGNOSIS — Z3A17 17 weeks gestation of pregnancy: Secondary | ICD-10-CM

## 2023-03-14 DIAGNOSIS — Z1379 Encounter for other screening for genetic and chromosomal anomalies: Secondary | ICD-10-CM

## 2023-03-14 DIAGNOSIS — Z363 Encounter for antenatal screening for malformations: Secondary | ICD-10-CM

## 2023-03-14 NOTE — Progress Notes (Signed)
 HIGH-RISK PREGNANCY VISIT Patient name: Cheyenne Moore MRN [id number]  Date of birth: 1990/03/18 Chief Complaint:   Routine Prenatal Visit (2nd IT)  History of Present Illness:   Cheyenne Moore is a 33 y.o. [phone] female at [redacted]w[redacted]d with an Estimated Date of Delivery: 08/22/23 being seen today for ongoing management of a high-risk pregnancy complicated by chronic hypertension currently on no meds and PG BMI 46.    Today she reports  back spasms yesterday after working in yard . Contractions: Not present.  .  Movement: Absent. denies leaking of fluid.      02/07/2023    3:11 PM 07/02/2021    9:00 AM 02/02/2021    2:57 PM 07/24/2019    9:08 AM 04/11/2019    9:08 AM  Depression screen PHQ 2/9  Decreased Interest 1 0 0 0 0  Down, Depressed, Hopeless 1 0 0 0 0  PHQ - 2 Score 2 0 0 0 0  Altered sleeping 0 0 0 0 0  Tired, decreased energy 1 2 1 1  0  Change in appetite 0 0 1 0 0  Feeling bad or failure about yourself  0 0 0 0 0  Trouble concentrating 0 0 0 0 0  Moving slowly or fidgety/restless 0 0 0 0 0  Suicidal thoughts 0 0 0 0 0  PHQ-9 Score 3 2 2 1  0        02/07/2023    3:11 PM 07/02/2021    9:00 AM 02/02/2021    2:57 PM 07/24/2019    9:08 AM  GAD 7 : Generalized Anxiety Score  Nervous, Anxious, on Edge 1 2 1  0  Control/stop worrying 0 1 0 0  Worry too much - different things 1 1 0 0  Trouble relaxing 0 0 0 0  Restless 0 0 0 0  Easily annoyed or irritable 1 1 0 0  Afraid - awful might happen 0 1 0 0  Total GAD 7 Score 3 6 1  0     Review of Systems:   Pertinent items are noted in HPI Denies abnormal vaginal discharge w/ itching/odor/irritation, headaches, visual changes, shortness of breath, chest pain, abdominal pain, severe nausea/vomiting, or problems with urination or bowel movements unless otherwise stated above. Pertinent History Reviewed:  Reviewed past medical,surgical, social, obstetrical and family history.  Reviewed problem list, medications and  allergies. Physical Assessment:   Vitals:   03/14/23 1353  BP: 119/78  Pulse: 77  Weight: 288 lb (130.6 kg)  Body mass index is 46.48 kg/m.           Physical Examination:   General appearance: alert, well appearing, and in no distress  Mental status: alert, oriented to person, place, and time  Skin: warm & dry   Extremities: Edema: None    Cardiovascular: normal heart rate noted  Respiratory: normal respiratory effort, no distress  Abdomen: gravid, soft, non-tender  Pelvic: Cervical exam deferred         Fetal Status: Fetal Heart Rate (bpm): 160   Movement: Absent    Fetal Surveillance Testing today: doppler   Chaperone: N/A    No results found for this or any previous visit (from the past 24 hours).  Assessment & Plan:  High-risk pregnancy: [id number] at [redacted]w[redacted]d with an Estimated Date of Delivery: 08/22/23   1) CHTN, stable, ASA, no meds  2) PG BMI 46, stable  3) Hypothyroid> on meds, last TSH 3.790  Meds: No orders of the defined types  were placed in this encounter.   Labs/procedures today: 2nd IT  Treatment Plan:  U/S 24, 28, 32, 36wks   2x/wk nst or weekly BPP @ 34wks   Deliver @ 38-39.6wks:______   Reviewed: Preterm labor symptoms and general obstetric precautions including but not limited to vaginal bleeding, contractions, leaking of fluid and fetal movement were reviewed in detail with the patient.  All questions were answered. Does have home bp cuff. Office bp cuff given: not applicable. Check bp weekly, let us know if consistently >140 and/or >90.  Follow-up: No follow-ups on file.   Future Appointments  Date Time Provider Department Center  04/04/2023 10:45 AM [facility] - [facility] [place] [facility] None  04/04/2023 11:30 AM Myna Hidalgo, DO [facility]    Orders Placed This Encounter  Procedures   US OB Comp + [address]   INTEGRATED 2   Cheral Marker [place], [facility] 03/14/2023 2:13 PM

## 2023-03-16 ENCOUNTER — Encounter: Payer: Self-pay | Admitting: Women's Health

## 2023-03-16 LAB — INTEGRATED 2
AFP MoM: 0.94
Alpha-Fetoprotein: 19.8 ng/mL
Crown Rump Length: 59 mm
DIA MoM: 0.88
DIA Value: 100.9 pg/mL
Estriol, Unconjugated: 0.72 ng/mL
Gest. Age on Collection Date: 12.3 wk
Gestational Age: 17.3 wk
Maternal Age at EDD: 31.4 a
Nuchal Translucency (NT): 1.3 mm
Nuchal Translucency MoM: 0.79
Number of Fetuses: 1
PAPP-A MoM: 1.32
PAPP-A Value: 549 ng/mL
Sonographer ID#: 309760
Test Results:: NEGATIVE
Weight: 280 [lb_av]
Weight: 280 [lb_av]
hCG MoM: 1.98
hCG Value: 33.6 [IU]/mL
uE3 MoM: 0.8

## 2023-04-04 ENCOUNTER — Ambulatory Visit: Payer: Medicaid Other

## 2023-04-04 ENCOUNTER — Encounter: Payer: Self-pay | Admitting: Obstetrics & Gynecology

## 2023-04-04 ENCOUNTER — Ambulatory Visit: Payer: Medicaid Other | Admitting: Obstetrics & Gynecology

## 2023-04-04 VITALS — BP 118/78 | HR 89 | Wt 289.6 lb

## 2023-04-04 DIAGNOSIS — O0992 Supervision of high risk pregnancy, unspecified, second trimester: Secondary | ICD-10-CM | POA: Diagnosis not present

## 2023-04-04 DIAGNOSIS — O10912 Unspecified pre-existing hypertension complicating pregnancy, second trimester: Secondary | ICD-10-CM

## 2023-04-04 DIAGNOSIS — Z3A20 20 weeks gestation of pregnancy: Secondary | ICD-10-CM | POA: Diagnosis not present

## 2023-04-04 DIAGNOSIS — Z363 Encounter for antenatal screening for malformations: Secondary | ICD-10-CM

## 2023-04-04 DIAGNOSIS — Z6841 Body Mass Index (BMI) 40.0 and over, adult: Secondary | ICD-10-CM

## 2023-04-04 DIAGNOSIS — E038 Other specified hypothyroidism: Secondary | ICD-10-CM

## 2023-04-04 DIAGNOSIS — O10919 Unspecified pre-existing hypertension complicating pregnancy, unspecified trimester: Secondary | ICD-10-CM

## 2023-04-04 NOTE — Progress Notes (Signed)
 [place] 20 wks,cephalic,cx 4.4 cm,posterior fundal placenta gr 0,normal ovaries,SVP of fluid 5.3 cm,FHR 157 bpm,EFW 365 g 78%,anatomy complete,no obvious abnormalities

## 2023-04-04 NOTE — Progress Notes (Signed)
 HIGH-RISK PREGNANCY VISIT Patient name: Cheyenne Moore MRN [id number]  Date of birth: [birth year] Chief Complaint:   Routine Prenatal Visit (Anatomy scan/Wants to talk about thyroid medication possibly causing headaches)  History of Present Illness:   Cheyenne Moore is a 33 y.o. [phone] female at [redacted]w[redacted]d with an Estimated Date of Delivery: 08/22/23 being seen today for ongoing management of a high-risk pregnancy complicated by:  -Chronic HTN -Hypothyroidism: Stopped taking her medication as she was concerned they were causing headaches.  Patient stopped her medication about a week ago,  She states that she has been on the same dose since prior to pregnancy.  She does feel as though her headaches have improved  Contractions: Not present. Vag. Bleeding: None.  Movement: Present. denies leaking of fluid.      02/07/2023    3:11 PM 07/02/2021    9:00 AM 02/02/2021    2:57 PM 07/24/2019    9:08 AM 04/11/2019    9:08 AM  Depression screen PHQ 2/9  Decreased Interest 1 0 0 0 0  Down, Depressed, Hopeless 1 0 0 0 0  PHQ - 2 Score 2 0 0 0 0  Altered sleeping 0 0 0 0 0  Tired, decreased energy 1 2 1 1  0  Change in appetite 0 0 1 0 0  Feeling bad or failure about yourself  0 0 0 0 0  Trouble concentrating 0 0 0 0 0  Moving slowly or fidgety/restless 0 0 0 0 0  Suicidal thoughts 0 0 0 0 0  PHQ-9 Score 3 2 2 1  0     Current Outpatient Medications  Medication Instructions   acetaminophen (TYLENOL) 650 mg, Oral, Every 4 hours PRN   aspirin EC 162 mg, Oral, Daily, Swallow whole.   cholecalciferol (VITAMIN D3) 1,000 Units, Daily   FeroSul 325 mg, Oral, Every other day   loratadine (CLARITIN) 10 mg, Daily   Prenatal Vit-Fe Fumarate-FA (PRENATAL VITAMIN PO) Take by mouth.   promethazine (PHENERGAN) 12.5-25 mg, Oral, Every 6 hours PRN   thyroid (ARMOUR THYROID) 90 mg, Oral, Daily before breakfast     Review of Systems:   Pertinent items are noted in HPI Denies abnormal vaginal discharge w/  itching/odor/irritation, visual changes, shortness of breath, chest pain, abdominal pain, severe nausea/vomiting, or problems with urination or bowel movements unless otherwise stated above. Pertinent History Reviewed:  Reviewed past medical,surgical, social, obstetrical and family history.  Reviewed problem list, medications and allergies. Physical Assessment:   Vitals:   04/04/23 1156 04/04/23 1159  BP: 133/89 118/78  Pulse: 89   Weight: 289 lb 9.6 oz (131.4 kg)   Body mass index is 46.74 kg/m.           Physical Examination:   General appearance: alert, well appearing, and in no distress  Mental status: normal mood, behavior, speech, dress, motor activity, and thought processes  Skin: warm & dry   Extremities: Edema: None    Cardiovascular: normal heart rate noted  Respiratory: normal respiratory effort, no distress  Abdomen: gravid, soft, non-tender  Pelvic: Cervical exam deferred         Fetal Status:     Movement: Present    Fetal Surveillance Testing today: cephalic,cx 4.4 cm,posterior fundal placenta gr 0,normal ovaries,SVP of fluid 5.3 cm,FHR 157 bpm,EFW 365 g 78%,anatomy complete,no obvious abnormalities    Chaperone: N/A    No results found for this or any previous visit (from the past 24 hours).   Assessment &  Plan:  High-risk pregnancy: [id number] at [redacted]w[redacted]d with an Estimated Date of Delivery: 08/22/23   1. Chronic hypertension affecting pregnancy -Stable off medication -Continue growth every 4 weeks  2. [redacted] weeks gestation of pregnancy -Normal anatomy scan today  3. BMI 45.0-49.9, adult (HCC)   4. Supervision of high risk pregnancy in second trimester (Primary)   5. Hypothyroidism -Advised patient to return to prior medication -Upon further questioning suspect headache may be due to lack of caffeine, okay to drink small amount of coffee -Should headache return when she restarts thyroid medication may consider transitioning to Synthroid instead of Armour  Thyroid to see if she notes a difference []  recheck level around 26wk   Meds: No orders of the defined types were placed in this encounter.   Labs/procedures today: anatomy scan  Treatment Plan: Routine OB care and as outlined above  Reviewed: Preterm labor symptoms and general obstetric precautions including but not limited to vaginal bleeding, contractions, leaking of fluid and fetal movement were reviewed in detail with the patient.  All questions were answered.  Patient has home bp cuff. Check bp weekly, let us know if >140/90.   Follow-up: Return in about 4 weeks (around 05/02/2023) for HROB visit (ok for midwife) and growth every 4 wks.   Future Appointments  Date Time Provider Department Center  05/02/2023  8:30 AM [facility] - FT IMG 2 [facility]FT[facility] None  05/02/2023  9:50 AM Cheral Marker, CNM [facility]-FT FTOBGYN  05/30/2023  8:30 AM [facility] - FT IMG 2 [facility] None  06/27/2023  8:30 AM [facility] - FTO[facility]YN [place] [facility] None  07/25/2023  8:30 AM [facility] - FTOBGYN [place] [facility]FT[facility] None    Orders Placed This Encounter  Procedures   US OB Follow Up    Myna Hidalgo, DO Attending Obstetrician & Gynecologist, Faculty Practice Center for [employer], [facility]

## 2023-04-25 ENCOUNTER — Other Ambulatory Visit: Payer: Self-pay | Admitting: Obstetrics & Gynecology

## 2023-04-25 DIAGNOSIS — O10919 Unspecified pre-existing hypertension complicating pregnancy, unspecified trimester: Secondary | ICD-10-CM

## 2023-04-25 DIAGNOSIS — O0990 Supervision of high risk pregnancy, unspecified, unspecified trimester: Secondary | ICD-10-CM

## 2023-04-25 DIAGNOSIS — E038 Other specified hypothyroidism: Secondary | ICD-10-CM

## 2023-04-25 DIAGNOSIS — O0992 Supervision of high risk pregnancy, unspecified, second trimester: Secondary | ICD-10-CM

## 2023-04-25 DIAGNOSIS — Z6841 Body Mass Index (BMI) 40.0 and over, adult: Secondary | ICD-10-CM

## 2023-04-25 DIAGNOSIS — Z3A20 20 weeks gestation of pregnancy: Secondary | ICD-10-CM

## 2023-05-02 ENCOUNTER — Ambulatory Visit: Admitting: Women's Health

## 2023-05-02 ENCOUNTER — Encounter: Payer: Self-pay | Admitting: Women's Health

## 2023-05-02 ENCOUNTER — Ambulatory Visit: Admitting: Radiology

## 2023-05-02 VITALS — BP 121/78 | HR 88 | Wt 297.0 lb

## 2023-05-02 DIAGNOSIS — O10919 Unspecified pre-existing hypertension complicating pregnancy, unspecified trimester: Secondary | ICD-10-CM

## 2023-05-02 DIAGNOSIS — O0992 Supervision of high risk pregnancy, unspecified, second trimester: Secondary | ICD-10-CM

## 2023-05-02 DIAGNOSIS — O10912 Unspecified pre-existing hypertension complicating pregnancy, second trimester: Secondary | ICD-10-CM

## 2023-05-02 DIAGNOSIS — Z3A24 24 weeks gestation of pregnancy: Secondary | ICD-10-CM | POA: Diagnosis not present

## 2023-05-02 DIAGNOSIS — O99212 Obesity complicating pregnancy, second trimester: Secondary | ICD-10-CM

## 2023-05-02 DIAGNOSIS — O0990 Supervision of high risk pregnancy, unspecified, unspecified trimester: Secondary | ICD-10-CM

## 2023-05-02 DIAGNOSIS — Z6841 Body Mass Index (BMI) 40.0 and over, adult: Secondary | ICD-10-CM

## 2023-05-02 LAB — POCT URINALYSIS DIPSTICK OB
Blood, UA: NEGATIVE
Glucose, UA: NEGATIVE
Ketones, UA: NEGATIVE
Leukocytes, UA: NEGATIVE
Nitrite, UA: NEGATIVE
POC,PROTEIN,UA: NEGATIVE

## 2023-05-02 MED ORDER — OMEPRAZOLE 10 MG PO CPDR: 10.0000 mg | DELAYED_RELEASE_CAPSULE | Freq: Every day | ORAL | 6 refills | Status: DC

## 2023-05-02 NOTE — Progress Notes (Addendum)
 HIGH-RISK PREGNANCY VISIT Patient name: Cheyenne Moore MRN [id number]  Date of birth: 1990-06-13 Chief Complaint:   High Risk Gestation ([place] today!!!)  History of Present Illness:   Cheyenne Moore is a 33 y.o. [phone] female at [redacted]w[redacted]d with an Estimated Date of Delivery: 08/22/23 being seen today for ongoing management of a high-risk pregnancy complicated by chronic hypertension currently on no meds, PG BMI 46, and hypothyroidism.    Today she reports no complaints. Requests rx for omeprazole, has been taking rx from last pregnancy, but has run out. Contractions: Not present. Vag. Bleeding: None.  Movement: Present. denies leaking of fluid.      02/07/2023    3:11 PM 07/02/2021    9:00 AM 02/02/2021    2:57 PM 07/24/2019    9:08 AM 04/11/2019    9:08 AM  Depression screen PHQ 2/9  Decreased Interest 1 0 0 0 0  Down, Depressed, Hopeless 1 0 0 0 0  PHQ - 2 Score 2 0 0 0 0  Altered sleeping 0 0 0 0 0  Tired, decreased energy 1 2 1 1  0  Change in appetite 0 0 1 0 0  Feeling bad or failure about yourself  0 0 0 0 0  Trouble concentrating 0 0 0 0 0  Moving slowly or fidgety/restless 0 0 0 0 0  Suicidal thoughts 0 0 0 0 0  PHQ-9 Score 3 2 2 1  0        02/07/2023    3:11 PM 07/02/2021    9:00 AM 02/02/2021    2:57 PM 07/24/2019    9:08 AM  GAD 7 : Generalized Anxiety Score  Nervous, Anxious, on Edge 1 2 1  0  Control/stop worrying 0 1 0 0  Worry too much - different things 1 1 0 0  Trouble relaxing 0 0 0 0  Restless 0 0 0 0  Easily annoyed or irritable 1 1 0 0  Afraid - awful might happen 0 1 0 0  Total GAD 7 Score 3 6 1  0     Review of Systems:   Pertinent items are noted in HPI Denies abnormal vaginal discharge w/ itching/odor/irritation, headaches, visual changes, shortness of breath, chest pain, abdominal pain, severe nausea/vomiting, or problems with urination or bowel movements unless otherwise stated above. Pertinent History Reviewed:  Reviewed past medical,surgical, social,  obstetrical and family history.  Reviewed problem list, medications and allergies. Physical Assessment:   Vitals:   05/02/23 0930  BP: 121/78  Pulse: 88  Weight: 297 lb (134.7 kg)  Body mass index is 47.94 kg/m.           Physical Examination:   General appearance: alert, well appearing, and in no distress  Mental status: alert, oriented to person, place, and time  Skin: warm & dry   Extremities: Edema: None    Cardiovascular: normal heart rate noted  Respiratory: normal respiratory effort, no distress  Abdomen: gravid, soft, non-tender  Pelvic: Cervical exam deferred         Fetal Status:     Movement: Present    Fetal Surveillance Testing today:  [place]:  GA = 24 weeks single active female fetus, transverse presentation, FHR = 149, MVP = 6.3 cm  fundal right lateral posterior pl, gr1, EFW 32%, 628g  CL = 4.8 cm, closed  Chaperone: N/A  Results for orders placed or performed in visit on 05/02/23 (from the past 24 hours)  POC Urinalysis Dipstick OB  Collection Time: 05/02/23  9:24 AM  Result Value Ref Range   Color, UA     Clarity, UA     Glucose, UA Negative Negative   Bilirubin, UA     Ketones, UA neg    Spec Grav, UA     Blood, UA neg    pH, UA     POC,PROTEIN,UA Negative Negative, Trace, Small (1+), Moderate (2+), Large (3+), 4+   Urobilinogen, UA     Nitrite, UA neg    Leukocytes, UA Negative Negative   Appearance     Odor      Assessment & Plan:  High-risk pregnancy: [id number] at [redacted]w[redacted]d with an Estimated Date of Delivery: 08/22/23   1) CHTN no meds, stable  2) PGBMI 46, currently 47  3) Hypothyroidism> on meds, check TSH next visit  4) Reflux> rx omeprazole per request  Meds: No orders of the defined types were placed in this encounter.  Labs/procedures today: U/S  Treatment Plan:  U/S q4wks   2x/wk nst or weekly BPP @ 34-36wks   Deliver @ 39-40.6wks   Reviewed: Preterm labor symptoms and general obstetric precautions including but not limited to  vaginal bleeding, contractions, leaking of fluid and fetal movement were reviewed in detail with the patient.  All questions were answered. Does have home bp cuff. Office bp cuff given: not applicable. Check bp weekly, let us  know if consistently >140 and/or >90.  Follow-up: Return in about 4 weeks (around 05/30/2023) for HROB, PN2, US :EFW, MD or CNM, in person.   Future Appointments  Date Time Provider Department Center  05/30/2023  8:30 AM [facility] - FT IMG 2 [facility]-FTIMG None  06/27/2023  8:30 AM [facility] - FTOBGYN US  C[facility]-FTIMG None  07/25/2023  8:30 AM [facility] - FTOBGYN US  [facility]-FTIMG None    Orders Placed This Encounter  Procedures   POC Urinalysis Dipstick OB   Ferd Householder CNM, [facility] 05/02/2023 10:06 AM

## 2023-05-02 NOTE — Addendum Note (Signed)
 Addended by: Ferd Householder on: 05/02/2023 10:13 AM   Modules accepted: Orders

## 2023-05-02 NOTE — Patient Instructions (Signed)
 Cheyenne Moore, thank you for choosing our office today! We appreciate the opportunity to meet your healthcare needs. You may receive a short survey by mail, e-mail, or through [employer]. If you are happy with your care we would appreciate if you could take just a few minutes to complete the survey questions. We read all of your comments and take your feedback very seriously. Thank you again for choosing our office.  Center for [employer] Team at [facility]  [facility] at [facility] ([address] [place], [place] [address]) Entrance C, located off of [address] Free 24/7 valet parking   You will have your sugar test next visit.  Please do not eat or drink anything after midnight the night before you come, not even water.  You will be here for at least two hours.  Please make an appointment online for the bloodwork at [email or web address] for 8:00am (or as close to this as possible). Make sure you select the [facility] service center.   CLASSES: Go to [email or web address] to register for classes (childbirth, breastfeeding, waterbirth, infant CPR, daddy bootcamp, etc.)  Call the office [phone]) or go to [facility] if: You begin to have strong, frequent contractions Your water breaks.  Sometimes it is a big gush of fluid, sometimes it is just a trickle that keeps getting your panties wet or running down your legs You have vaginal bleeding.  It is normal to have a small amount of spotting if your cervix was checked.  You don't feel your baby moving like normal.  If you don't, get you something to eat and drink and lay down and focus on feeling your baby move.   If your baby is still not moving like normal, you should call the office or go to [facility].  Call the office [phone]) or go to [facility] hospital for these signs of pre-eclampsia: Severe headache that does not go away with Tylenol Visual changes- seeing spots, double, blurred vision Pain under your right breast or upper  abdomen that does not go away with Tums or heartburn medicine Nausea and/or vomiting Severe swelling in your hands, feet, and face    [place] Pediatricians/Family Doctors [facility]): [address] Meg Spina, [phone]           [facility]: [address], [phone]                [facility]): [address], [phone]  [facility] Department: [address], [place], [phone]    [facility] Pediatricians/Family Doctors [facility]): [address], [phone] [facility]: [address] [place], [phone] [facility] [place]: [address], [phone]  [facility] Doctors  [facility]): [phone] [facility]: [address] [phone]   [facility] Doctors [facility]: [address] [phone]  [facility] Doctors  [facility]: [phone], [phone]  Home Blood Pressure Monitoring for Patients   Your provider has recommended that you check your blood pressure (BP) at least once a week at home. If you do not have a blood pressure cuff at home, one will be provided for you. Contact your provider if you have not received your monitor within 1 week.   Helpful Tips for Accurate Home Blood Pressure Checks  Don't smoke, exercise, or drink caffeine 30 minutes before checking  your BP Use the restroom before checking your BP (a full bladder can raise your pressure) Relax in a comfortable upright chair Feet on the ground Left arm resting comfortably on a flat surface at the level of your heart Legs uncrossed Back supported Sit quietly and don't talk Place the cuff on your bare arm Adjust snuggly, so that only two fingertips can fit between your skin and the top of the cuff Check 2 readings separated by at least one  minute Keep a log of your BP readings For a visual, please reference this diagram: http://ccnc.care/bpdiagram  Provider Name: [facility]     Phone: [phone]  Zone 1: ALL CLEAR  Continue to monitor your symptoms:  BP reading is less than 140 (top number) or less than 90 (bottom number)  No right upper stomach pain No headaches or seeing spots No feeling nauseated or throwing up No swelling in face and hands  Zone 2: CAUTION Call your doctor's office for any of the following:  BP reading is greater than 140 (top number) or greater than 90 (bottom number)  Stomach pain under your ribs in the middle or right side Headaches or seeing spots Feeling nauseated or throwing up Swelling in face and hands  Zone 3: EMERGENCY  Seek immediate medical care if you have any of the following:  BP reading is greater than160 (top number) or greater than 110 (bottom number) Severe headaches not improving with Tylenol Serious difficulty catching your breath Any worsening symptoms from Zone 2   Second Trimester of Pregnancy The second trimester is from week 13 through week 28, months 4 through 6. The second trimester is often a time when you feel your best. Your body has also adjusted to being pregnant, and you begin to feel better physically. Usually, morning sickness has lessened or quit completely, you may have more energy, and you may have an increase in appetite. The second trimester is also a time when the fetus is growing rapidly. At the end of the sixth month, the fetus is about 9 inches long and weighs about 1 pounds. You will likely begin to feel the baby move (quickening) between 18 and 20 weeks of the pregnancy. BODY CHANGES Your body goes through many changes during pregnancy. The changes vary from woman to woman.  Your weight will continue to increase. You will notice your lower abdomen bulging out. You may begin to get stretch marks on your hips, abdomen, and breasts. You may  develop headaches that can be relieved by medicines approved by your health care provider. You may urinate more often because the fetus is pressing on your bladder. You may develop or continue to have heartburn as a result of your pregnancy. You may develop constipation because certain hormones are causing the muscles that push waste through your intestines to slow down. You may develop hemorrhoids or swollen, bulging veins (varicose veins). You may have back pain because of the weight gain and pregnancy hormones relaxing your joints between the bones in your pelvis and as a result of a shift in weight and the muscles that support your balance. Your breasts will continue to grow and be tender. Your gums may bleed and may be sensitive to brushing and flossing. Dark spots or blotches (chloasma, mask of pregnancy) may develop on your face. This will likely fade after the baby is born. A dark line from your belly button to the pubic area (linea nigra) may appear. This will likely fade after the  baby is born. You may have changes in your hair. These can include thickening of your hair, rapid growth, and changes in texture. Some women also have hair loss during or after pregnancy, or hair that feels dry or thin. Your hair will most likely return to normal after your baby is born. WHAT TO EXPECT AT YOUR PRENATAL VISITS During a routine prenatal visit: You will be weighed to make sure you and the fetus are growing normally. Your blood pressure will be taken. Your abdomen will be measured to track your baby's growth. The fetal heartbeat will be listened to. Any test results from the previous visit will be discussed. Your health care provider may ask you: How you are feeling. If you are feeling the baby move. If you have had any abnormal symptoms, such as leaking fluid, bleeding, severe headaches, or abdominal cramping. If you have any questions. Other tests that may be performed during your second  trimester include: Blood tests that check for: Low iron levels (anemia). Gestational diabetes (between 24 and 28 weeks). Rh antibodies. Urine tests to check for infections, diabetes, or protein in the urine. An ultrasound to confirm the proper growth and development of the baby. An amniocentesis to check for possible genetic problems. Fetal screens for spina bifida and Down syndrome. HOME CARE INSTRUCTIONS  Avoid all smoking, herbs, alcohol, and unprescribed drugs. These chemicals affect the formation and growth of the baby. Follow your health care provider's instructions regarding medicine use. There are medicines that are either safe or unsafe to take during pregnancy. Exercise only as directed by your health care provider. Experiencing uterine cramps is a good sign to stop exercising. Continue to eat regular, healthy meals. Wear a good support bra for breast tenderness. Do not use hot tubs, steam rooms, or saunas. Wear your seat belt at all times when driving. Avoid raw meat, uncooked cheese, cat litter boxes, and soil used by cats. These carry germs that can cause birth defects in the baby. Take your prenatal vitamins. Try taking a stool softener (if your health care provider approves) if you develop constipation. Eat more high-fiber foods, such as fresh vegetables or fruit and whole grains. Drink plenty of fluids to keep your urine clear or pale yellow. Take warm sitz baths to soothe any pain or discomfort caused by hemorrhoids. Use hemorrhoid cream if your health care provider approves. If you develop varicose veins, wear support hose. Elevate your feet for 15 minutes, 3-4 times a day. Limit salt in your diet. Avoid heavy lifting, wear low heel shoes, and practice good posture. Rest with your legs elevated if you have leg cramps or low back pain. Visit your dentist if you have not gone yet during your pregnancy. Use a soft toothbrush to brush your teeth and be gentle when you floss. A  sexual relationship may be continued unless your health care provider directs you otherwise. Continue to go to all your prenatal visits as directed by your health care provider. SEEK MEDICAL CARE IF:  You have dizziness. You have mild pelvic cramps, pelvic pressure, or nagging pain in the abdominal area. You have persistent nausea, vomiting, or diarrhea. You have a bad smelling vaginal discharge. You have pain with urination. SEEK IMMEDIATE MEDICAL CARE IF:  You have a fever. You are leaking fluid from your vagina. You have spotting or bleeding from your vagina. You have severe abdominal cramping or pain. You have rapid weight gain or loss. You have shortness of breath with chest pain. You  notice sudden or extreme swelling of your face, hands, ankles, feet, or legs. You have not felt your baby move in over an hour. You have severe headaches that do not go away with medicine. You have vision changes. Document Released: 12/28/2000 Document Revised: 01/08/2013 Document Reviewed: 03/06/2012 [facility] Patient Information 2015 [place], [place]. This information is not intended to replace advice given to you by your health care provider. Make sure you discuss any questions you have with your health care provider.

## 2023-05-02 NOTE — Progress Notes (Signed)
 US :  GA = 24 weeks single active female fetus, transverse presentation, FHR = 149, MVP = 6.3 cm  fundal right lateral posterior pl, gr1, EFW 32%, 628g  CL = 4.8 cm, closed

## 2023-05-11 ENCOUNTER — Other Ambulatory Visit: Payer: Self-pay | Admitting: Advanced Practice Midwife

## 2023-05-15 ENCOUNTER — Encounter: Payer: Self-pay | Admitting: Women's Health

## 2023-05-15 ENCOUNTER — Other Ambulatory Visit: Payer: Self-pay | Admitting: *Deleted

## 2023-05-15 MED ORDER — THYROID 90 MG PO TABS: 90.0000 mg | ORAL_TABLET | Freq: Every day | ORAL | 11 refills | Status: AC

## 2023-05-30 ENCOUNTER — Other Ambulatory Visit

## 2023-05-30 ENCOUNTER — Encounter: Payer: Self-pay | Admitting: Women's Health

## 2023-05-30 ENCOUNTER — Telehealth: Admitting: Women's Health

## 2023-05-30 ENCOUNTER — Other Ambulatory Visit: Admitting: Radiology

## 2023-05-30 ENCOUNTER — Encounter: Admitting: Women's Health

## 2023-05-30 DIAGNOSIS — O0992 Supervision of high risk pregnancy, unspecified, second trimester: Secondary | ICD-10-CM

## 2023-05-30 DIAGNOSIS — Z131 Encounter for screening for diabetes mellitus: Secondary | ICD-10-CM

## 2023-05-30 DIAGNOSIS — E039 Hypothyroidism, unspecified: Secondary | ICD-10-CM | POA: Diagnosis not present

## 2023-05-30 DIAGNOSIS — O99283 Endocrine, nutritional and metabolic diseases complicating pregnancy, third trimester: Secondary | ICD-10-CM

## 2023-05-30 DIAGNOSIS — O10013 Pre-existing essential hypertension complicating pregnancy, third trimester: Secondary | ICD-10-CM

## 2023-05-30 DIAGNOSIS — O0993 Supervision of high risk pregnancy, unspecified, third trimester: Secondary | ICD-10-CM | POA: Diagnosis not present

## 2023-05-30 DIAGNOSIS — Z3A28 28 weeks gestation of pregnancy: Secondary | ICD-10-CM

## 2023-05-30 DIAGNOSIS — E038 Other specified hypothyroidism: Secondary | ICD-10-CM

## 2023-05-30 DIAGNOSIS — Z6841 Body Mass Index (BMI) 40.0 and over, adult: Secondary | ICD-10-CM

## 2023-05-30 DIAGNOSIS — O10919 Unspecified pre-existing hypertension complicating pregnancy, unspecified trimester: Secondary | ICD-10-CM

## 2023-05-30 NOTE — Progress Notes (Signed)
 TELEHEALTH VIRTUAL OBSTETRICS VISIT ENCOUNTER NOTE Patient name: Cheyenne Moore MRN [id number]  Date of birth: January 26, 1990  I connected with patient on 05/30/23 at 10:30 AM EDT by MyChart video  and verified that I am speaking with the correct person using two identifiers. Pt is not currently in our office, she is at the lab.  The provider is in the office.    I discussed the limitations, risks, security and privacy concerns of performing an evaluation and management service by telephone and the availability of in person appointments. I also discussed with the patient that there may be a patient responsible charge related to this service. The patient expressed understanding and agreed to proceed.  Chief Complaint:   Routine Prenatal Visit  History of Present Illness:   Cheyenne Moore is a 33 y.o. [phone] female at [redacted]w[redacted]d with an Estimated Date of Delivery: 08/22/23 being evaluated today for ongoing management of a high-risk pregnancy.     02/07/2023    3:11 PM 07/02/2021    9:00 AM 02/02/2021    2:57 PM 07/24/2019    9:08 AM 04/11/2019    9:08 AM  Depression screen PHQ 2/9  Decreased Interest 1 0 0 0 0  Down, Depressed, Hopeless 1 0 0 0 0  PHQ - 2 Score 2 0 0 0 0  Altered sleeping 0 0 0 0 0  Tired, decreased energy 1 2 1 1  0  Change in appetite 0 0 1 0 0  Feeling bad or failure about yourself  0 0 0 0 0  Trouble concentrating 0 0 0 0 0  Moving slowly or fidgety/restless 0 0 0 0 0  Suicidal thoughts 0 0 0 0 0  PHQ-9 Score 3 2 2 1  0    Today she reports no complaints. Contractions: Not present. Vag. Bleeding: None.  Movement: Present. denies leaking of fluid. Review of Systems:   Pertinent items are noted in HPI Denies abnormal vaginal discharge w/ itching/odor/irritation, headaches, visual changes, shortness of breath, chest pain, abdominal pain, severe nausea/vomiting, or problems with urination or bowel movements unless otherwise stated above. Pertinent History Reviewed:  Reviewed  past medical,surgical, social, obstetrical and family history.  Reviewed problem list, medications and allergies. Physical Assessment:  There were no vitals filed for this visit.There is no height or weight on file to calculate BMI. *Will check bp when gets home and send message        Physical Examination:   General:  Alert, oriented and cooperative.   Mental Status: Normal mood and affect perceived. Normal judgment and thought content.  Rest of physical exam deferred due to type of encounter  No results found for this or any previous visit (from the past 24 hours).  Assessment & Plan:  1) Pregnancy [id number] at [redacted]w[redacted]d with an Estimated Date of Delivery: 08/22/23   2) CHTN, no meds  3) PGBMI 46  4) Hypothyroidism> on meds, TSH today  5) H/O GDM x 2> doing GTT today, if + will need new supplies, but doesn't feel she needs to repeat dietician visit   Meds: No orders of the defined types were placed in this encounter.   Labs/procedures today: PN2 (office closed to pts d/t water break, visit done online)  Plan:  Continue routine obstetrical care.  Does have home bp cuff. Office bp cuff given: not applicable. Check bp weekly, let us  know if consistently >140 and/or >90.  Next visit: prefers in person    Reviewed: Preterm labor symptoms  and general obstetric precautions including but not limited to vaginal bleeding, contractions, leaking of fluid and fetal movement were reviewed in detail with the patient. The patient was advised to call back or seek an in-person office evaluation/go to [facility]U at [facility] for any urgent or concerning symptoms. All questions were answered. Please refer to After Visit Summary for other counseling recommendations.    I provided 10 minutes of non-face-to-face time during this encounter.  Follow-up: Return in about 2 weeks (around 06/13/2023) for HROB, US :EFW, MD or CNM, in person.  No orders of the defined types were placed in this  encounter.  Ferd Householder CNM, [facility] 05/30/2023 10:55 AM    Pt sent MyChart message, BP 116/79, HR [address] CNM, [facility] 05/31/2023 9:07 AM

## 2023-05-30 NOTE — Patient Instructions (Signed)
[place], thank you for choosing our office today! We appreciate the opportunity to meet your healthcare needs. You may receive a short survey by mail, e-mail, or through MyChart. If you are happy with your care we would appreciate if you could take just a few minutes to complete the survey questions. We read all of your comments and take your feedback very seriously. Thank you again for choosing our office.  [facility]  [facility] ([address] [place], [place] [address]) Entrance C, located off of [address] Free 24/7 valet parking   CLASSES: Go to [email or web address] to register for classes (childbirth, breastfeeding, waterbirth, infant CPR, daddy bootcamp, etc.)  Call the office ([phone]) or go to [facility] if: You begin to have strong, frequent contractions Your water breaks.  Sometimes it is a big gush of fluid, sometimes it is just a trickle that keeps getting your panties wet or running down your legs You have vaginal bleeding.  It is normal to have a small amount of spotting if your cervix was checked.  You don't feel your baby moving like normal.  If you don't, get you something to eat and drink and lay down and focus on feeling your baby move.   If your baby is still not moving like normal, you should call the office or go to [facility].  Call the office ([phone]) or go to [facility] for these signs of pre-eclampsia: Severe headache that does not go away with Tylenol Visual changes- seeing spots, double, blurred vision Pain under your right breast or upper abdomen that does not go away with Tums or heartburn medicine Nausea and/or vomiting Severe swelling in your hands, feet, and face   Tdap Vaccine It is recommended that you get the Tdap vaccine during the third trimester of EACH pregnancy to help protect your baby from getting pertussis (whooping cough) 27-36 weeks is the BEST time to do  this so that you can pass the protection on to your baby. During pregnancy is better than after pregnancy, but if you are unable to get it during pregnancy it will be offered at the hospital.  You can get this vaccine with us, at the health department, your family doctor, or some local pharmacies Everyone who will be around your baby should also be up-to-date on their vaccines before the baby comes. Adults (who are not pregnant) only need 1 dose of Tdap during adulthood.   [place] Pediatricians/Family Doctors [facility] ([facility]): [address], [phone]           [facility]: [address], [phone]                [facility] ([facility]): [address], [phone] (call to ask if accepting patients) [facility]: [address], [place], [phone]    [place] Pediatricians/Family Doctors [facility] ([facility]): [address], [phone] [facility]: [address], [phone] [facility]: [address], [phone]  [place] [facility]  [facility] ([facility]): [phone] [facility]: [address], [phone]   [place] Family Doctors [facility]: [address], [phone]  [place] [facility]  [facility]: [address], [phone]  Home Blood Pressure Monitoring for Patients   Your provider has recommended that you check your   blood pressure (BP) at least once a week at home. If you do not have a blood pressure cuff at home, one will be provided for you. Contact your provider if you have not received your monitor within 1 week.   Helpful Tips for Accurate Home Blood Pressure Checks  Don't smoke, exercise, or drink caffeine 30 minutes before checking your BP Use the restroom before checking your BP (a full bladder can raise your  pressure) Relax in a comfortable upright chair Feet on the ground Left arm resting comfortably on a flat surface at the level of your heart Legs uncrossed Back supported Sit quietly and don't talk Place the cuff on your bare arm Adjust snuggly, so that only two fingertips can fit between your skin and the top of the cuff Check 2 readings separated by at least one minute Keep a log of your BP readings For a visual, please reference this diagram: http://ccnc.care/bpdiagram  Provider Name: [facility]     Phone: [phone]  Zone 1: ALL CLEAR  Continue to monitor your symptoms:  BP reading is less than 140 (top number) or less than 90 (bottom number)  No right upper stomach pain No headaches or seeing spots No feeling nauseated or throwing up No swelling in face and hands  Zone 2: CAUTION Call your doctor's office for any of the following:  BP reading is greater than 140 (top number) or greater than 90 (bottom number)  Stomach pain under your ribs in the middle or right side Headaches or seeing spots Feeling nauseated or throwing up Swelling in face and hands  Zone 3: EMERGENCY  Seek immediate medical care if you have any of the following:  BP reading is greater than160 (top number) or greater than 110 (bottom number) Severe headaches not improving with Tylenol Serious difficulty catching your breath Any worsening symptoms from Zone 2   Third Trimester of Pregnancy The third trimester is from week 29 through week 42, months 7 through 9. The third trimester is a time when the fetus is growing rapidly. At the end of the ninth month, the fetus is about 20 inches in length and weighs 6-10 pounds.  BODY CHANGES Your body goes through many changes during pregnancy. The changes vary from woman to woman.  Your weight will continue to increase. You can expect to gain 25-35 pounds (11-16 kg) by the end of the pregnancy. You may begin to get stretch marks on your hips, abdomen,  and breasts. You may urinate more often because the fetus is moving lower into your pelvis and pressing on your bladder. You may develop or continue to have heartburn as a result of your pregnancy. You may develop constipation because certain hormones are causing the muscles that push waste through your intestines to slow down. You may develop hemorrhoids or swollen, bulging veins (varicose veins). You may have pelvic pain because of the weight gain and pregnancy hormones relaxing your joints between the bones in your pelvis. Backaches may result from overexertion of the muscles supporting your posture. You may have changes in your hair. These can include thickening of your hair, rapid growth, and changes in texture. Some women also have hair loss during or after pregnancy, or hair that feels dry or thin. Your hair will most likely return to normal after your baby is born. Your breasts will continue to grow and be tender. A yellow discharge may leak from your breasts called colostrum. Your belly button may stick out. You may   feel short of breath because of your expanding uterus. You may notice the fetus "dropping," or moving lower in your abdomen. You may have a bloody mucus discharge. This usually occurs a few days to a week before labor begins. Your cervix becomes thin and soft (effaced) near your due date. WHAT TO EXPECT AT YOUR PRENATAL EXAMS  You will have prenatal exams every 2 weeks until week 36. Then, you will have weekly prenatal exams. During a routine prenatal visit: You will be weighed to make sure you and the fetus are growing normally. Your blood pressure is taken. Your abdomen will be measured to track your baby's growth. The fetal heartbeat will be listened to. Any test results from the previous visit will be discussed. You may have a cervical check near your due date to see if you have effaced. At around 36 weeks, your caregiver will check your cervix. At the same time, your  caregiver will also perform a test on the secretions of the vaginal tissue. This test is to determine if a type of bacteria, Group B streptococcus, is present. Your caregiver will explain this further. Your caregiver may ask you: What your birth plan is. How you are feeling. If you are feeling the baby move. If you have had any abnormal symptoms, such as leaking fluid, bleeding, severe headaches, or abdominal cramping. If you have any questions. Other tests or screenings that may be performed during your third trimester include: Blood tests that check for low iron levels (anemia). Fetal testing to check the health, activity level, and growth of the fetus. Testing is done if you have certain medical conditions or if there are problems during the pregnancy. FALSE LABOR You may feel small, irregular contractions that eventually go away. These are called Braxton Hicks contractions, or false labor. Contractions may last for hours, days, or even weeks before true labor sets in. If contractions come at regular intervals, intensify, or become painful, it is best to be seen by your caregiver.  SIGNS OF LABOR  Menstrual-like cramps. Contractions that are 5 minutes apart or less. Contractions that start on the top of the uterus and spread down to the lower abdomen and back. A sense of increased pelvic pressure or back pain. A watery or bloody mucus discharge that comes from the vagina. If you have any of these signs before the 37th week of pregnancy, call your caregiver right away. You need to go to the hospital to get checked immediately. HOME CARE INSTRUCTIONS  Avoid all smoking, herbs, alcohol, and unprescribed drugs. These chemicals affect the formation and growth of the baby. Follow your caregiver's instructions regarding medicine use. There are medicines that are either safe or unsafe to take during pregnancy. Exercise only as directed by your caregiver. Experiencing uterine cramps is a good sign to  stop exercising. Continue to eat regular, healthy meals. Wear a good support bra for breast tenderness. Do not use hot tubs, steam rooms, or saunas. Wear your seat belt at all times when driving. Avoid raw meat, uncooked cheese, cat litter boxes, and soil used by cats. These carry germs that can cause birth defects in the baby. Take your prenatal vitamins. Try taking a stool softener (if your caregiver approves) if you develop constipation. Eat more high-fiber foods, such as fresh vegetables or fruit and whole grains. Drink plenty of fluids to keep your urine clear or pale yellow. Take warm sitz baths to soothe any pain or discomfort caused by hemorrhoids. Use hemorrhoid cream if   your caregiver approves. If you develop varicose veins, wear support hose. Elevate your feet for 15 minutes, 3-4 times a day. Limit salt in your diet. Avoid heavy lifting, wear low heal shoes, and practice good posture. Rest a lot with your legs elevated if you have leg cramps or low back pain. Visit your dentist if you have not gone during your pregnancy. Use a soft toothbrush to brush your teeth and be gentle when you floss. A sexual relationship may be continued unless your caregiver directs you otherwise. Do not travel far distances unless it is absolutely necessary and only with the approval of your caregiver. Take prenatal classes to understand, practice, and ask questions about the labor and delivery. Make a trial run to the hospital. Pack your hospital bag. Prepare the baby's nursery. Continue to go to all your prenatal visits as directed by your caregiver. SEEK MEDICAL CARE IF: You are unsure if you are in labor or if your water has broken. You have dizziness. You have mild pelvic cramps, pelvic pressure, or nagging pain in your abdominal area. You have persistent nausea, vomiting, or diarrhea. You have a bad smelling vaginal discharge. You have pain with urination. SEEK IMMEDIATE MEDICAL CARE IF:  You  have a fever. You are leaking fluid from your vagina. You have spotting or bleeding from your vagina. You have severe abdominal cramping or pain. You have rapid weight loss or gain. You have shortness of breath with chest pain. You notice sudden or extreme swelling of your face, hands, ankles, feet, or legs. You have not felt your baby move in over an hour. You have severe headaches that do not go away with medicine. You have vision changes. Document Released: 12/28/2000 Document Revised: 01/08/2013 Document Reviewed: 03/06/2012 ExitCare Patient Information 2015 [employer]. This information is not intended to replace advice given to you by your health care provider. Make sure you discuss any questions you have with your health care provider.       

## 2023-05-31 ENCOUNTER — Ambulatory Visit: Payer: Self-pay | Admitting: Women's Health

## 2023-06-01 LAB — TSH: TSH: 3.39 u[IU]/mL (ref 0.450–4.500)

## 2023-06-01 LAB — GLUCOSE TOLERANCE, 2 HOURS W/ 1HR
Glucose, 1 hour: 162 mg/dL (ref 70–179)
Glucose, 2 hour: 91 mg/dL (ref 70–152)
Glucose, Fasting: 88 mg/dL (ref 70–91)

## 2023-06-01 LAB — CBC
Hematocrit: 37.1 % (ref 34.0–46.6)
Hemoglobin: 12.2 g/dL (ref 11.1–15.9)
MCH: 30.8 pg (ref 26.6–33.0)
MCHC: 32.9 g/dL (ref 31.5–35.7)
MCV: 94 fL (ref 79–97)
Platelets: 201 10*3/uL (ref 150–450)
RBC: 3.96 x10E6/uL (ref 3.77–5.28)
RDW: 12.3 % (ref 11.7–15.4)
WBC: 10.2 10*3/uL (ref 3.4–10.8)

## 2023-06-01 LAB — ANTIBODY SCREEN: Antibody Screen: NEGATIVE

## 2023-06-01 LAB — RPR: RPR Ser Ql: NONREACTIVE

## 2023-06-01 LAB — HIV ANTIBODY (ROUTINE TESTING W REFLEX)

## 2023-06-14 ENCOUNTER — Ambulatory Visit: Admitting: Advanced Practice Midwife

## 2023-06-14 ENCOUNTER — Ambulatory Visit: Admitting: Radiology

## 2023-06-14 ENCOUNTER — Encounter: Payer: Self-pay | Admitting: Advanced Practice Midwife

## 2023-06-14 VITALS — BP 118/82 | HR 98 | Wt 305.0 lb

## 2023-06-14 DIAGNOSIS — O10913 Unspecified pre-existing hypertension complicating pregnancy, third trimester: Secondary | ICD-10-CM

## 2023-06-14 DIAGNOSIS — Z3A30 30 weeks gestation of pregnancy: Secondary | ICD-10-CM

## 2023-06-14 DIAGNOSIS — O0992 Supervision of high risk pregnancy, unspecified, second trimester: Secondary | ICD-10-CM

## 2023-06-14 DIAGNOSIS — O0993 Supervision of high risk pregnancy, unspecified, third trimester: Secondary | ICD-10-CM

## 2023-06-14 DIAGNOSIS — O10919 Unspecified pre-existing hypertension complicating pregnancy, unspecified trimester: Secondary | ICD-10-CM

## 2023-06-14 DIAGNOSIS — Z8632 Personal history of gestational diabetes: Secondary | ICD-10-CM

## 2023-06-14 DIAGNOSIS — O99213 Obesity complicating pregnancy, third trimester: Secondary | ICD-10-CM | POA: Diagnosis not present

## 2023-06-14 DIAGNOSIS — O09293 Supervision of pregnancy with other poor reproductive or obstetric history, third trimester: Secondary | ICD-10-CM | POA: Diagnosis not present

## 2023-06-14 DIAGNOSIS — O09299 Supervision of pregnancy with other poor reproductive or obstetric history, unspecified trimester: Secondary | ICD-10-CM

## 2023-06-14 DIAGNOSIS — Z331 Pregnant state, incidental: Secondary | ICD-10-CM

## 2023-06-14 DIAGNOSIS — O0990 Supervision of high risk pregnancy, unspecified, unspecified trimester: Secondary | ICD-10-CM

## 2023-06-14 DIAGNOSIS — Z1389 Encounter for screening for other disorder: Secondary | ICD-10-CM | POA: Diagnosis not present

## 2023-06-14 LAB — POCT URINALYSIS DIPSTICK OB
Blood, UA: NEGATIVE
Glucose, UA: NEGATIVE
Leukocytes, UA: NEGATIVE
Nitrite, UA: NEGATIVE

## 2023-06-14 NOTE — Progress Notes (Signed)
 US : GA = 30+1 weeks Single active female fetus, Samuel Crock Breech, FHR = 147 bpm, Fundal placenta, gr1, AFI = 16.6 cm, 65%, MVP = 5.5 cm, EFW 72%, 1714g, nl ov's, cervix appears closed

## 2023-06-14 NOTE — Progress Notes (Signed)
 HIGH-RISK PREGNANCY VISIT Patient name: Cheyenne Moore MRN [id number]  Date of birth: October 11, 1990 Chief Complaint:   Routine Prenatal Visit and Pregnancy Ultrasound  History of Present Illness:   Cheyenne Moore is a 33 y.o. [id number] female at [redacted]w[redacted]d with an Estimated Date of Delivery: 08/22/23 being seen today for ongoing management of a high-risk pregnancy complicated by chronic hypertension currently on no meds; PG BMI 46, and hypothyroidism.  Today she reports heartburn stable on omeprazole  20mg . Contractions: Irregular.  .  Movement: Present. denies leaking of fluid.      02/07/2023    3:11 PM 07/02/2021    9:00 AM 02/02/2021    2:57 PM 07/24/2019    9:08 AM 04/11/2019    9:08 AM  Depression screen PHQ 2/9  Decreased Interest 1 0 0 0 0  Down, Depressed, Hopeless 1 0 0 0 0  PHQ - 2 Score 2 0 0 0 0  Altered sleeping 0 0 0 0 0  Tired, decreased energy 1 2 1 1  0  Change in appetite 0 0 1 0 0  Feeling bad or failure about yourself  0 0 0 0 0  Trouble concentrating 0 0 0 0 0  Moving slowly or fidgety/restless 0 0 0 0 0  Suicidal thoughts 0 0 0 0 0  PHQ-9 Score 3 2 2 1  0        02/07/2023    3:11 PM 07/02/2021    9:00 AM 02/02/2021    2:57 PM 07/24/2019    9:08 AM  GAD 7 : Generalized Anxiety Score  Nervous, Anxious, on Edge 1 2 1  0  Control/stop worrying 0 1 0 0  Worry too much - different things 1 1 0 0  Trouble relaxing 0 0 0 0  Restless 0 0 0 0  Easily annoyed or irritable 1 1 0 0  Afraid - awful might happen 0 1 0 0  Total GAD 7 Score 3 6 1  0     Review of Systems:   Pertinent items are noted in HPI Denies abnormal vaginal discharge w/ itching/odor/irritation, headaches, visual changes, shortness of breath, chest pain, abdominal pain, severe nausea/vomiting, or problems with urination or bowel movements unless otherwise stated above. Pertinent History Reviewed:  Reviewed past medical,surgical, social, obstetrical and family history.  Reviewed problem list, medications  and allergies. Physical Assessment:   Vitals:   06/14/23 1547  BP: 118/82  Pulse: 98  Weight: (!) 305 lb (138.3 kg)  Body mass index is 49.23 kg/m.           Physical Examination:   General appearance: alert, well appearing, and in no distress  Mental status: alert, oriented to person, place, and time  Skin: warm & dry   Extremities: Edema: None    Cardiovascular: normal heart rate noted  Respiratory: normal respiratory effort, no distress  Abdomen: gravid, soft, non-tender  Pelvic: Cervical exam deferred         Fetal Status: Fetal Heart Rate (bpm): 147 u/s   Movement: Present    Fetal Surveillance Testing today: US : GA = 30+1 weeks Single active female fetus, Samuel Crock Breech, FHR = 147 bpm, Fundal placenta, gr1, AFI = 16.6 cm, 65%, MVP = 5.5 cm, EFW 72%, 1714g, nl ov's, cervix appears closed   Results for orders placed or performed in visit on 06/14/23 (from the past 24 hours)  POC Urinalysis Dipstick OB   Collection Time: 06/14/23  3:46 PM  Result Value Ref Range  Color, UA     Clarity, UA     Glucose, UA Negative Negative   Bilirubin, UA     Ketones, UA small    Spec Grav, UA     Blood, UA negative    pH, UA     POC,PROTEIN,UA Trace Negative, Trace, Small (1+), Moderate (2+), Large (3+), 4+   Urobilinogen, UA     Nitrite, UA negative    Leukocytes, UA Negative Negative   Appearance     Odor      Assessment & Plan:  High-risk pregnancy: [id number] at [redacted]w[redacted]d with an Estimated Date of Delivery: 08/22/23   1) cHTN, stable without meds  2) PG BMI 46, currently 49; will start testing @ 36wks; q 4wk EFW  3) Hypothyroidism, on Armour 90mg ; 28wk TSH 3.390; will get another in a month  Meds: No orders of the defined types were placed in this encounter.   Labs/procedures today: U/S  Treatment Plan:  q 4wk EFW; ante testing @ 36wks  Reviewed: Preterm labor symptoms and general obstetric precautions including but not limited to vaginal bleeding, contractions, leaking of  fluid and fetal movement were reviewed in detail with the patient.  All questions were answered. Does have home bp cuff. Office bp cuff given: not applicable. Check bp daily, let us  know if consistently >140 and/or >90.  Follow-up: Return for 2wk HROB; 4wk HROB and EFW; 6wk HROB and starting weekly BPP; 8wk EFW (cancel 6/8 and 7/10 u/s).   Future Appointments  Date Time Provider Department Center  06/29/2023 11:10 AM Carlo Chessman, Blanca Bunch, CNM [facility]    Orders Placed This Encounter  Procedures   POC Urinalysis Dipstick OB   Jolayne Natter [facility] 06/14/2023 4:36 PM

## 2023-06-14 NOTE — Patient Instructions (Signed)
[place], thank you for choosing our office today! We appreciate the opportunity to meet your healthcare needs. You may receive a short survey by mail, e-mail, or through MyChart. If you are happy with your care we would appreciate if you could take just a few minutes to complete the survey questions. We read all of your comments and take your feedback very seriously. Thank you again for choosing our office.  [facility]  [facility] ([address] [place], [place] [address]) Entrance C, located off of [address] Free 24/7 valet parking   CLASSES: Go to [email or web address] to register for classes (childbirth, breastfeeding, waterbirth, infant CPR, daddy bootcamp, etc.)  Call the office ([phone]) or go to [facility] if: You begin to have strong, frequent contractions Your water breaks.  Sometimes it is a big gush of fluid, sometimes it is just a trickle that keeps getting your panties wet or running down your legs You have vaginal bleeding.  It is normal to have a small amount of spotting if your cervix was checked.  You don't feel your baby moving like normal.  If you don't, get you something to eat and drink and lay down and focus on feeling your baby move.   If your baby is still not moving like normal, you should call the office or go to [facility].  Call the office ([phone]) or go to [facility] for these signs of pre-eclampsia: Severe headache that does not go away with Tylenol Visual changes- seeing spots, double, blurred vision Pain under your right breast or upper abdomen that does not go away with Tums or heartburn medicine Nausea and/or vomiting Severe swelling in your hands, feet, and face   Tdap Vaccine It is recommended that you get the Tdap vaccine during the third trimester of EACH pregnancy to help protect your baby from getting pertussis (whooping cough) 27-36 weeks is the BEST time to do  this so that you can pass the protection on to your baby. During pregnancy is better than after pregnancy, but if you are unable to get it during pregnancy it will be offered at the hospital.  You can get this vaccine with us, at the health department, your family doctor, or some local pharmacies Everyone who will be around your baby should also be up-to-date on their vaccines before the baby comes. Adults (who are not pregnant) only need 1 dose of Tdap during adulthood.   [place] Pediatricians/Family Doctors [facility] ([facility]): [address], [phone]           [facility]: [address], [phone]                [facility] ([facility]): [address], [phone] (call to ask if accepting patients) [facility]: [address], [place], [phone]    [place] Pediatricians/Family Doctors [facility] ([facility]): [address], [phone] [facility]: [address], [phone] [facility]: [address], [phone]  [place] [facility]  [facility] ([facility]): [phone] [facility]: [address], [phone]   [place] Family Doctors [facility]: [address], [phone]  [place] [facility]  [facility]: [address], [phone]  Home Blood Pressure Monitoring for Patients   Your provider has recommended that you check your   blood pressure (BP) at least once a week at home. If you do not have a blood pressure cuff at home, one will be provided for you. Contact your provider if you have not received your monitor within 1 week.   Helpful Tips for Accurate Home Blood Pressure Checks  Don't smoke, exercise, or drink caffeine 30 minutes before checking your BP Use the restroom before checking your BP (a full bladder can raise your  pressure) Relax in a comfortable upright chair Feet on the ground Left arm resting comfortably on a flat surface at the level of your heart Legs uncrossed Back supported Sit quietly and don't talk Place the cuff on your bare arm Adjust snuggly, so that only two fingertips can fit between your skin and the top of the cuff Check 2 readings separated by at least one minute Keep a log of your BP readings For a visual, please reference this diagram: http://ccnc.care/bpdiagram  Provider Name: [facility]     Phone: [phone]  Zone 1: ALL CLEAR  Continue to monitor your symptoms:  BP reading is less than 140 (top number) or less than 90 (bottom number)  No right upper stomach pain No headaches or seeing spots No feeling nauseated or throwing up No swelling in face and hands  Zone 2: CAUTION Call your doctor's office for any of the following:  BP reading is greater than 140 (top number) or greater than 90 (bottom number)  Stomach pain under your ribs in the middle or right side Headaches or seeing spots Feeling nauseated or throwing up Swelling in face and hands  Zone 3: EMERGENCY  Seek immediate medical care if you have any of the following:  BP reading is greater than160 (top number) or greater than 110 (bottom number) Severe headaches not improving with Tylenol Serious difficulty catching your breath Any worsening symptoms from Zone 2   Third Trimester of Pregnancy The third trimester is from week 29 through week 42, months 7 through 9. The third trimester is a time when the fetus is growing rapidly. At the end of the ninth month, the fetus is about 20 inches in length and weighs 6-10 pounds.  BODY CHANGES Your body goes through many changes during pregnancy. The changes vary from woman to woman.  Your weight will continue to increase. You can expect to gain 25-35 pounds (11-16 kg) by the end of the pregnancy. You may begin to get stretch marks on your hips, abdomen,  and breasts. You may urinate more often because the fetus is moving lower into your pelvis and pressing on your bladder. You may develop or continue to have heartburn as a result of your pregnancy. You may develop constipation because certain hormones are causing the muscles that push waste through your intestines to slow down. You may develop hemorrhoids or swollen, bulging veins (varicose veins). You may have pelvic pain because of the weight gain and pregnancy hormones relaxing your joints between the bones in your pelvis. Backaches may result from overexertion of the muscles supporting your posture. You may have changes in your hair. These can include thickening of your hair, rapid growth, and changes in texture. Some women also have hair loss during or after pregnancy, or hair that feels dry or thin. Your hair will most likely return to normal after your baby is born. Your breasts will continue to grow and be tender. A yellow discharge may leak from your breasts called colostrum. Your belly button may stick out. You may   feel short of breath because of your expanding uterus. You may notice the fetus "dropping," or moving lower in your abdomen. You may have a bloody mucus discharge. This usually occurs a few days to a week before labor begins. Your cervix becomes thin and soft (effaced) near your due date. WHAT TO EXPECT AT YOUR PRENATAL EXAMS  You will have prenatal exams every 2 weeks until week 36. Then, you will have weekly prenatal exams. During a routine prenatal visit: You will be weighed to make sure you and the fetus are growing normally. Your blood pressure is taken. Your abdomen will be measured to track your baby's growth. The fetal heartbeat will be listened to. Any test results from the previous visit will be discussed. You may have a cervical check near your due date to see if you have effaced. At around 36 weeks, your caregiver will check your cervix. At the same time, your  caregiver will also perform a test on the secretions of the vaginal tissue. This test is to determine if a type of bacteria, Group B streptococcus, is present. Your caregiver will explain this further. Your caregiver may ask you: What your birth plan is. How you are feeling. If you are feeling the baby move. If you have had any abnormal symptoms, such as leaking fluid, bleeding, severe headaches, or abdominal cramping. If you have any questions. Other tests or screenings that may be performed during your third trimester include: Blood tests that check for low iron levels (anemia). Fetal testing to check the health, activity level, and growth of the fetus. Testing is done if you have certain medical conditions or if there are problems during the pregnancy. FALSE LABOR You may feel small, irregular contractions that eventually go away. These are called Braxton Hicks contractions, or false labor. Contractions may last for hours, days, or even weeks before true labor sets in. If contractions come at regular intervals, intensify, or become painful, it is best to be seen by your caregiver.  SIGNS OF LABOR  Menstrual-like cramps. Contractions that are 5 minutes apart or less. Contractions that start on the top of the uterus and spread down to the lower abdomen and back. A sense of increased pelvic pressure or back pain. A watery or bloody mucus discharge that comes from the vagina. If you have any of these signs before the 37th week of pregnancy, call your caregiver right away. You need to go to the hospital to get checked immediately. HOME CARE INSTRUCTIONS  Avoid all smoking, herbs, alcohol, and unprescribed drugs. These chemicals affect the formation and growth of the baby. Follow your caregiver's instructions regarding medicine use. There are medicines that are either safe or unsafe to take during pregnancy. Exercise only as directed by your caregiver. Experiencing uterine cramps is a good sign to  stop exercising. Continue to eat regular, healthy meals. Wear a good support bra for breast tenderness. Do not use hot tubs, steam rooms, or saunas. Wear your seat belt at all times when driving. Avoid raw meat, uncooked cheese, cat litter boxes, and soil used by cats. These carry germs that can cause birth defects in the baby. Take your prenatal vitamins. Try taking a stool softener (if your caregiver approves) if you develop constipation. Eat more high-fiber foods, such as fresh vegetables or fruit and whole grains. Drink plenty of fluids to keep your urine clear or pale yellow. Take warm sitz baths to soothe any pain or discomfort caused by hemorrhoids. Use hemorrhoid cream if   your caregiver approves. If you develop varicose veins, wear support hose. Elevate your feet for 15 minutes, 3-4 times a day. Limit salt in your diet. Avoid heavy lifting, wear low heal shoes, and practice good posture. Rest a lot with your legs elevated if you have leg cramps or low back pain. Visit your dentist if you have not gone during your pregnancy. Use a soft toothbrush to brush your teeth and be gentle when you floss. A sexual relationship may be continued unless your caregiver directs you otherwise. Do not travel far distances unless it is absolutely necessary and only with the approval of your caregiver. Take prenatal classes to understand, practice, and ask questions about the labor and delivery. Make a trial run to the hospital. Pack your hospital bag. Prepare the baby's nursery. Continue to go to all your prenatal visits as directed by your caregiver. SEEK MEDICAL CARE IF: You are unsure if you are in labor or if your water has broken. You have dizziness. You have mild pelvic cramps, pelvic pressure, or nagging pain in your abdominal area. You have persistent nausea, vomiting, or diarrhea. You have a bad smelling vaginal discharge. You have pain with urination. SEEK IMMEDIATE MEDICAL CARE IF:  You  have a fever. You are leaking fluid from your vagina. You have spotting or bleeding from your vagina. You have severe abdominal cramping or pain. You have rapid weight loss or gain. You have shortness of breath with chest pain. You notice sudden or extreme swelling of your face, hands, ankles, feet, or legs. You have not felt your baby move in over an hour. You have severe headaches that do not go away with medicine. You have vision changes. Document Released: 12/28/2000 Document Revised: 01/08/2013 Document Reviewed: 03/06/2012 ExitCare Patient Information 2015 [employer]. This information is not intended to replace advice given to you by your health care provider. Make sure you discuss any questions you have with your health care provider.       

## 2023-06-23 ENCOUNTER — Encounter: Payer: Self-pay | Admitting: Women's Health

## 2023-06-27 ENCOUNTER — Other Ambulatory Visit: Admitting: Radiology

## 2023-06-29 ENCOUNTER — Ambulatory Visit: Admitting: Advanced Practice Midwife

## 2023-06-29 ENCOUNTER — Encounter: Payer: Self-pay | Admitting: Advanced Practice Midwife

## 2023-06-29 VITALS — BP 117/80 | HR 94 | Wt 307.3 lb

## 2023-06-29 DIAGNOSIS — E038 Other specified hypothyroidism: Secondary | ICD-10-CM | POA: Diagnosis not present

## 2023-06-29 DIAGNOSIS — Z23 Encounter for immunization: Secondary | ICD-10-CM | POA: Diagnosis not present

## 2023-06-29 DIAGNOSIS — O0993 Supervision of high risk pregnancy, unspecified, third trimester: Secondary | ICD-10-CM | POA: Diagnosis not present

## 2023-06-29 DIAGNOSIS — Z3A32 32 weeks gestation of pregnancy: Secondary | ICD-10-CM

## 2023-06-29 DIAGNOSIS — I10 Essential (primary) hypertension: Secondary | ICD-10-CM

## 2023-06-29 NOTE — Patient Instructions (Signed)
 Jesusa Moros, I greatly value your feedback.  If you receive a survey following your visit with us  today, we appreciate you taking the time to fill it out.  Thanks, Monda Angry, DNP, CNM  [facility] HAS MOVED!!! It is now [facility] at [facility] ([address] [place], [place] [address]) Entrance located off of [address] Free 24/7 valet parking   Go to [email or web address] to register for FREE online childbirth classes    Call the office [phone]) or go to [facility] if: You begin to have strong, frequent contractions Your water breaks.  Sometimes it is a big gush of fluid, sometimes it is just a trickle that keeps getting your panties wet or running down your legs You have vaginal bleeding.  It is normal to have a small amount of spotting if your cervix was checked.  You don't feel your baby moving like normal.  If you don't, get you something to eat and drink and lay down and focus on feeling your baby move.  You should feel at least 10 movements in 2 hours.  If you don't, you should call the office or go to [facility].   Home Blood Pressure Monitoring for Patients   Your provider has recommended that you check your blood pressure (BP) at least once a week at home. If you do not have a blood pressure cuff at home, one will be provided for you. Contact your provider if you have not received your monitor within 1 week.   Helpful Tips for Accurate Home Blood Pressure Checks  Don't smoke, exercise, or drink caffeine 30 minutes before checking your BP Use the restroom before checking your BP (a full bladder can raise your pressure) Relax in a comfortable upright chair Feet on the ground Left arm resting comfortably on a flat surface at the level of your heart Legs uncrossed Back supported Sit quietly and don't talk Place the cuff on your bare arm Adjust snuggly, so that only two fingertips can fit between your skin and the top  of the cuff Check 2 readings separated by at least one minute Keep a log of your BP readings For a visual, please reference this diagram: http://ccnc.care/bpdiagram  Provider Name: [facility]     Phone: [phone]  Zone 1: ALL CLEAR  Continue to monitor your symptoms:  BP reading is less than 140 (top number) or less than 90 (bottom number)  No right upper stomach pain No headaches or seeing spots No feeling nauseated or throwing up No swelling in face and hands  Zone 2: CAUTION Call your doctor's office for any of the following:  BP reading is greater than 140 (top number) or greater than 90 (bottom number)  Stomach pain under your ribs in the middle or right side Headaches or seeing spots Feeling nauseated or throwing up Swelling in face and hands  Zone 3: EMERGENCY  Seek immediate medical care if you have any of the following:  BP reading is greater than160 (top number) or greater than 110 (bottom number) Severe headaches not improving with Tylenol  Serious difficulty catching your breath Any worsening symptoms from Zone 2

## 2023-06-29 NOTE — Progress Notes (Signed)
 HIGH-RISK PREGNANCY VISIT Patient name: Cheyenne Moore MRN [id number]  Date of birth: March 09, 1990 Chief Complaint:   Routine Prenatal Visit (Tdap today)  History of Present Illness:   Cheyenne Moore is a 33 y.o. [phone] female at [redacted]w[redacted]d with an Estimated Date of Delivery: 08/22/23 being seen today for ongoing management of a high-risk pregnancy complicated by chronic hypertension currently on no meds and morbid obesity BMI >=40.    Today she reports nausea, zofran  causes HA, but still has som phenergan . Contractions: Not present.  .  Movement: Present. denies leaking of fluid.      02/07/2023    3:11 PM 07/02/2021    9:00 AM 02/02/2021    2:57 PM 07/24/2019    9:08 AM 04/11/2019    9:08 AM  Depression screen PHQ 2/9  Decreased Interest 1 0 0 0 0  Down, Depressed, Hopeless 1 0 0 0 0  PHQ - 2 Score 2 0 0 0 0  Altered sleeping 0 0 0 0 0  Tired, decreased energy 1 2 1 1  0  Change in appetite 0 0 1 0 0  Feeling bad or failure about yourself  0 0 0 0 0  Trouble concentrating 0 0 0 0 0  Moving slowly or fidgety/restless 0 0 0 0 0  Suicidal thoughts 0 0 0 0 0  PHQ-9 Score 3 2 2 1  0        02/07/2023    3:11 PM 07/02/2021    9:00 AM 02/02/2021    2:57 PM 07/24/2019    9:08 AM  GAD 7 : Generalized Anxiety Score  Nervous, Anxious, on Edge 1 2 1  0  Control/stop worrying 0 1 0 0  Worry too much - different things 1 1 0 0  Trouble relaxing 0 0 0 0  Restless 0 0 0 0  Easily annoyed or irritable 1 1 0 0  Afraid - awful might happen 0 1 0 0  Total GAD 7 Score 3 6 1  0     Review of Systems:   Pertinent items are noted in HPI Denies abnormal vaginal discharge w/ itching/odor/irritation, headaches, visual changes, shortness of breath, chest pain, abdominal pain, severe nausea/vomiting, or problems with urination or bowel movements unless otherwise stated above. Pertinent History Reviewed:  Reviewed past medical,surgical, social, obstetrical and family history.  Reviewed problem list,  medications and allergies. Physical Assessment:   Vitals:   06/29/23 1137  BP: 117/80  Pulse: 94  Weight: (!) 307 lb 4.8 oz (139.4 kg)  Body mass index is 49.6 kg/m.           Physical Examination:   General appearance: alert, well appearing, and in no distress  Mental status: alert, oriented to person, place, and time  Skin: warm & dry   Extremities: Edema: Trace    Cardiovascular: normal heart rate noted  Respiratory: normal respiratory effort, no distress  Abdomen: gravid, soft, non-tender  Pelvic: Cervical exam deferred         Chaperone: N/A    Fetal Status: Fetal Heart Rate (bpm): 148   Movement: Present    Fetal Surveillance Testing today:      No results found for this or any previous visit (from the past 24 hours).  Assessment & Plan:  High-risk pregnancy: [id number] at [redacted]w[redacted]d with an Estimated Date of Delivery: 08/22/23   1. [redacted] weeks gestation of pregnancy (Primary)   2. Other specified hypothyroidism  - TSH  3. Supervision of high-risk  pregnancy, third trimester   4. Morbid obesity (HCC) Start testing at 34 weeks  5. Chronic hypertension No meds, IOL 38-39.6    Meds: No orders of the defined types were placed in this encounter.   Orders:  Orders Placed This Encounter  Procedures   Tdap vaccine greater than or equal to 7yo IM   TSH     Labs/procedures today: Tdap   Reviewed: Preterm labor symptoms and general obstetric precautions including but not limited to vaginal bleeding, contractions, leaking of fluid and fetal movement were reviewed in detail with the patient.  All questions were answered. Does have home bp cuff. Office bp cuff given: not applicable. Check bp weekly, let us  know if consistently >140 and/or >90.  Follow-up: No follow-ups on file.   Future Appointments  Date Time Provider Department Center  07/25/2023  9:50 AM [facility] NURSE [facility]  07/28/2023 10:45 AM [facility] US  [facility] None  07/28/2023 11:30 AM Wendelyn Halter, MD [facility]  07/31/2023  9:50 AM [facility] NURSE [facility]  08/03/2023 11:30 AM [facility] US  [facility] None  08/03/2023  1:30 PM Keene Pastures, DO [facility]  08/07/2023 10:30 AM [facility] NURSE [facility]  08/10/2023 10:45 AM [facility] US  [facility] None  08/10/2023 11:30 AM Ferd Householder, CNM [facility]  08/14/2023  9:50 AM [facility] NURSE [facility]  08/17/2023  9:15 AM [facility] US  [facility] None  08/17/2023 10:10 AM Ferd Householder, CNM [facility]    Orders Placed This Encounter  Procedures   Tdap vaccine greater than or equal to 7yo IM   TSH   Majel Scott , DNP, CNM [facility] 06/29/2023 12:05 PM

## 2023-06-30 LAB — TSH: TSH: 1.08 u[IU]/mL (ref 0.450–4.500)

## 2023-07-11 ENCOUNTER — Other Ambulatory Visit: Payer: Self-pay | Admitting: Obstetrics & Gynecology

## 2023-07-11 DIAGNOSIS — O0990 Supervision of high risk pregnancy, unspecified, unspecified trimester: Secondary | ICD-10-CM

## 2023-07-11 DIAGNOSIS — O10919 Unspecified pre-existing hypertension complicating pregnancy, unspecified trimester: Secondary | ICD-10-CM

## 2023-07-12 ENCOUNTER — Encounter: Payer: Self-pay | Admitting: Women's Health

## 2023-07-12 ENCOUNTER — Ambulatory Visit

## 2023-07-12 ENCOUNTER — Ambulatory Visit: Admitting: Women's Health

## 2023-07-12 VITALS — BP 120/83 | HR 93 | Wt 312.0 lb

## 2023-07-12 DIAGNOSIS — O0993 Supervision of high risk pregnancy, unspecified, third trimester: Secondary | ICD-10-CM | POA: Diagnosis not present

## 2023-07-12 DIAGNOSIS — I10 Essential (primary) hypertension: Secondary | ICD-10-CM

## 2023-07-12 DIAGNOSIS — O10913 Unspecified pre-existing hypertension complicating pregnancy, third trimester: Secondary | ICD-10-CM

## 2023-07-12 DIAGNOSIS — Z3A34 34 weeks gestation of pregnancy: Secondary | ICD-10-CM | POA: Diagnosis not present

## 2023-07-12 DIAGNOSIS — O10919 Unspecified pre-existing hypertension complicating pregnancy, unspecified trimester: Secondary | ICD-10-CM

## 2023-07-12 DIAGNOSIS — Z6841 Body Mass Index (BMI) 40.0 and over, adult: Secondary | ICD-10-CM

## 2023-07-12 DIAGNOSIS — O99213 Obesity complicating pregnancy, third trimester: Secondary | ICD-10-CM | POA: Diagnosis not present

## 2023-07-12 DIAGNOSIS — O0990 Supervision of high risk pregnancy, unspecified, unspecified trimester: Secondary | ICD-10-CM

## 2023-07-12 NOTE — Patient Instructions (Signed)
 Cheyenne Moore, thank you for choosing our office today! We appreciate the opportunity to meet your healthcare needs. You may receive a short survey by mail, e-mail, or through [employer]. If you are happy with your care we would appreciate if you could take just a few minutes to complete the survey questions. We read all of your comments and take your feedback very seriously. Thank you again for choosing our office.  Center for [employer] Team at [facility]  [facility] at [facility] ([address] [place], [place] [address]) Entrance C, located off of [address] valet parking  So your baby is breech? Here are some things you can try to encourage baby to turn head down: [email or web address] (different positions to try and more information) [facility] [place] ([phone]) $120 new patients, do not file insurances [facility] [phone]) Webster's technique- has to be done by a chiropractor certified in the technique Dive into the deep end of a swimming pool, or do a hand-stand in a swimming pool (the water should be deep enough that your belly is completely covered)   CLASSES: Go to [email or web address] to register for classes (childbirth, breastfeeding, waterbirth, infant CPR, daddy bootcamp, etc.)  Call the office [phone]) or go to [facility] if: You begin to have strong, frequent contractions Your water breaks.  Sometimes it is a big gush of fluid, sometimes it is just a trickle that keeps getting your panties wet or running down your legs You have vaginal bleeding.  It is normal to have a small amount of spotting if your cervix was checked.  You don't feel your baby moving like normal.  If you don't, get you something to eat and drink and lay down and focus on feeling your baby move.   If your baby is still not moving like normal, you should call the office or go to [facility].  Call the office [phone]) or go  to [facility] hospital for these signs of pre-eclampsia: Severe headache that does not go away with Tylenol  Visual changes- seeing spots, double, blurred vision Pain under your right breast or upper abdomen that does not go away with Tums or heartburn medicine Nausea and/or vomiting Severe swelling in your hands, feet, and face   Tdap Vaccine It is recommended that you get the Tdap vaccine during the third trimester of EACH pregnancy to help protect your baby from getting pertussis (whooping cough) 27-36 weeks is the BEST time to do this so that you can pass the protection on to your baby. During pregnancy is better than after pregnancy, but if you are unable to get it during pregnancy it will be offered at the hospital.  You can get this vaccine with us , at the health department, your family doctor, or some local pharmacies Everyone who will be around your baby should also be up-to-date on their vaccines before the baby comes. Adults (who are not pregnant) only need 1 dose of Tdap during adulthood.   [facility] Pediatricians/Family Doctors [facility]): [address] Luba BROCKS, [phone]           [facility]: [address], [phone]                [facility]): [address], [phone] (call to ask if accepting patients) [facility] Department: [address], [place], [phone]    [facility] Pediatricians/Family Doctors [facility]): [phone] [address], [phone] [facility]: [address] [place], [phone] [facility]: [address], [phone]  [facility] Doctors  [facility]): [phone] [facility]: [address], [phone]   [facility] Doctors [facility]: [address] [phone]  [facility] Doctors  [facility]: [phone], [phone]  Home Blood Pressure Monitoring for Patients   Your provider has recommended that you check your blood pressure (BP) at least once a week at home. If you do not have a blood pressure cuff at home, one will be provided for you. Contact your provider if you have not received your monitor within 1 week.   Helpful Tips for Accurate Home Blood Pressure Checks  Don't smoke, exercise, or drink caffeine 30 minutes before checking your BP Use the restroom before checking your BP (a full bladder can raise your pressure) Relax in a comfortable upright chair Feet on the ground Left arm resting comfortably on a flat surface at the level of your heart Legs uncrossed Back supported Sit quietly and don't talk Place the cuff on your bare arm Adjust snuggly, so that only two fingertips can fit between your skin and the top of the cuff Check 2 readings separated by at least one minute Keep a log of your BP readings For a visual, please reference this diagram: http://ccnc.care/bpdiagram  Provider Name: [facility]     Phone: [phone]  Zone 1: ALL CLEAR  Continue to monitor your symptoms:  BP reading is less than 140 (top number) or less than 90 (bottom number)  No right upper stomach pain No headaches or seeing spots No feeling nauseated or throwing up No swelling in face and hands  Zone 2: CAUTION Call your doctor's office for any of the following:  BP reading is greater than 140 (top number) or greater than 90 (bottom number)  Stomach pain under your ribs in the middle or right side Headaches or seeing spots Feeling nauseated or throwing up Swelling in face and hands  Zone 3: EMERGENCY  Seek immediate medical care if you have any of the following:  BP reading is greater than160 (top number) or greater than 110 (bottom number) Severe headaches not improving with Tylenol  Serious difficulty catching your breath Any worsening symptoms from Zone 2   Preterm Labor and Birth Information  The normal length of a pregnancy is 39-41 weeks. Preterm labor is when labor starts before 37 completed weeks of pregnancy. What are the risk factors for preterm labor? Preterm labor is more likely to occur in women who: Have certain infections during pregnancy such as a bladder infection, sexually transmitted infection, or infection inside the uterus (chorioamnionitis). Have a shorter-than-normal cervix. Have gone into preterm labor before. Have had surgery on their cervix. Are younger than age 48 or older than age 34. Are African American. Are pregnant with twins or multiple babies (multiple gestation). Take street drugs or smoke while pregnant. Do not gain enough weight while pregnant. Became pregnant shortly after having been pregnant. What are the symptoms of preterm labor? Symptoms of preterm labor include: Cramps similar to those that can happen during a menstrual period. The cramps may happen with diarrhea. Pain in the abdomen or lower back. Regular uterine contractions that may feel like tightening of the abdomen. A feeling of increased pressure in the pelvis. Increased watery or bloody mucus discharge from the vagina. Water breaking (ruptured  amniotic sac). Why is it important to recognize signs of preterm labor? It is important to recognize signs of preterm labor because babies who are born prematurely may not be fully developed. This can put them at an increased risk for: Long-term (chronic) heart and lung problems. Difficulty immediately after birth with regulating body systems, including blood sugar, body temperature, heart rate, and breathing rate. Bleeding in the brain. Cerebral palsy. Learning difficulties. Death. These risks are highest for babies who are born before 34 weeks of pregnancy. How is preterm labor treated? Treatment depends on the length of your pregnancy, your condition, and the health of your baby. It may  involve: Having a stitch (suture) placed in your cervix to prevent your cervix from opening too early (cerclage). Taking or being given medicines, such as: Hormone medicines. These may be given early in pregnancy to help support the pregnancy. Medicine to stop contractions. Medicines to help mature the baby's lungs. These may be prescribed if the risk of delivery is high. Medicines to prevent your baby from developing cerebral palsy. If the labor happens before 34 weeks of pregnancy, you may need to stay in the hospital. What should I do if I think I am in preterm labor? If you think that you are going into preterm labor, call your health care provider right away. How can I prevent preterm labor in future pregnancies? To increase your chance of having a full-term pregnancy: Do not use any tobacco products, such as cigarettes, chewing tobacco, and e-cigarettes. If you need help quitting, ask your health care provider. Do not use street drugs or medicines that have not been prescribed to you during your pregnancy. Talk with your health care provider before taking any herbal supplements, even if you have been taking them regularly. Make sure you gain a healthy amount of weight during your pregnancy. Watch for infection. If you think that you might have an infection, get it checked right away. Make sure to tell your health care provider if you have gone into preterm labor before. This information is not intended to replace advice given to you by your health care provider. Make sure you discuss any questions you have with your health care provider. Document Revised: 04/27/2018 Document Reviewed: 05/27/2015 Elsevier Patient Education  2020 [employer].

## 2023-07-12 NOTE — Progress Notes (Signed)
 HIGH-RISK PREGNANCY VISIT Patient name: Cheyenne Moore MRN [id number]  Date of birth: [birth year] Chief Complaint:   Routine Prenatal Visit  History of Present Illness:   Cheyenne Moore is a 33 y.o. [id number] female at [redacted]w[redacted]d with an Estimated Date of Delivery: 08/22/23 being seen today for ongoing management of a high-risk pregnancy complicated by chronic hypertension currently on no meds and PG BMI 46.    Today she reports no complaints. Contractions: Not present. Vag. Bleeding: None.  Movement: Present. denies leaking of fluid.      02/07/2023    3:11 PM 07/02/2021    9:00 AM 02/02/2021    2:57 PM 07/24/2019    9:08 AM 04/11/2019    9:08 AM  Depression screen PHQ 2/9  Decreased Interest 1 0 0 0 0  Down, Depressed, Hopeless 1 0 0 0 0  PHQ - 2 Score 2 0 0 0 0  Altered sleeping 0 0 0 0 0  Tired, decreased energy 1 2 1 1  0  Change in appetite 0 0 1 0 0  Feeling bad or failure about yourself  0 0 0 0 0  Trouble concentrating 0 0 0 0 0  Moving slowly or fidgety/restless 0 0 0 0 0  Suicidal thoughts 0 0 0 0 0  PHQ-9 Score 3 2 2 1  0        02/07/2023    3:11 PM 07/02/2021    9:00 AM 02/02/2021    2:57 PM 07/24/2019    9:08 AM  GAD 7 : Generalized Anxiety Score  Nervous, Anxious, on Edge 1 2 1  0  Control/stop worrying 0 1 0 0  Worry too much - different things 1 1 0 0  Trouble relaxing 0 0 0 0  Restless 0 0 0 0  Easily annoyed or irritable 1 1 0 0  Afraid - awful might happen 0 1 0 0  Total GAD 7 Score 3 6 1  0     Review of Systems:   Pertinent items are noted in HPI Denies abnormal vaginal discharge w/ itching/odor/irritation, headaches, visual changes, shortness of breath, chest pain, abdominal pain, severe nausea/vomiting, or problems with urination or bowel movements unless otherwise stated above. Pertinent History Reviewed:  Reviewed past medical,surgical, social, obstetrical and family history.  Reviewed problem list, medications and allergies. Physical Assessment:    Vitals:   07/12/23 1221  BP: 120/83  Pulse: 93  Weight: (!) 312 lb (141.5 kg)  Body mass index is 50.36 kg/m.           Physical Examination:   General appearance: alert, well appearing, and in no distress  Mental status: alert, oriented to person, place, and time  Skin: warm & dry   Extremities: Edema: Trace    Cardiovascular: normal heart rate noted  Respiratory: normal respiratory effort, no distress  Abdomen: gravid, soft, non-tender  Pelvic: Cervical exam deferred         Fetal Status:     Movement: Present    Fetal Surveillance Testing today: US  34+1 wks,complete breech,BPP 8/8,fundal placenta gr 1,AFI 17 cm,EFW 2750 g 86%,AC 97%   Chaperone: N/A  No results found for this or any previous visit (from the past 24 hours).  Assessment & Plan:  High-risk pregnancy: [id number] at [redacted]w[redacted]d with an Estimated Date of Delivery: 08/22/23   1) CHTN, stable, no meds, ASA  2) PGBMI 46, currently 50  3) Hypothyroid> on meds, last TSH 1.080 (6/12)  4) Breech presentation> gave printed  tricks  Meds: No orders of the defined types were placed in this encounter.   Labs/procedures today: U/S  Treatment Plan:  EFW q4wks   2x/wk nst or weekly BPP    Deliver @ 38-39.6wks   Reviewed: Preterm labor symptoms and general obstetric precautions including but not limited to vaginal bleeding, contractions, leaking of fluid and fetal movement were reviewed in detail with the patient.  All questions were answered. Does have home bp cuff. Office bp cuff given: not applicable. Check bp weekly, let us  know if consistently >140 and/or >90.  Follow-up: No follow-ups on file.   Future Appointments  Date Time Provider Department Center  07/12/2023  1:50 PM Kizzie Suzen SA[place], [place] [facility]  07/25/2023  9:50 AM [facility] NURSE [facility]  07/28/2023 10:45 AM [facility] US  [facility] None  07/28/2023 11:30 AM Jayne Vonn DEL, MD [facility]  07/31/2023  9:50 AM [facility] NURSE [facility]  FT[facility]GYN  08/03/2023 11:30 AM [facility] US  [facility] None  08/03/2023  1:30 PM Marilynn Nest, DO [facility]  08/07/2023 10:30 AM [facility] NURSE [facility]  08/10/2023 10:45 AM [facility] US  [facility] None  08/10/2023 11:30 AM Kizzie Suzen SAUNDERS, CNM [facility]  08/14/2023  9:50 AM [facility] NURSE [facility]  08/17/2023  9:15 AM [facility] US  [facility] None  08/17/2023 10:10 AM Kizzie Suzen SAUNDERS, CNM [facility]  08/21/2023 10:30 AM [facility] NURSE [facility]    No orders of the defined types were placed in this encounter.  Suzen SAUNDERS Kizzie CNM, [facility] 07/12/2023 12:29 PM

## 2023-07-12 NOTE — Progress Notes (Signed)
 US  34+1 wks,complete breech,BPP 8/8,fundal placenta gr 1,AFI 17 cm,EFW 2750 g 86%,AC 97%

## 2023-07-25 ENCOUNTER — Other Ambulatory Visit

## 2023-07-25 ENCOUNTER — Ambulatory Visit: Admitting: *Deleted

## 2023-07-25 VITALS — BP 111/74 | HR 101

## 2023-07-25 DIAGNOSIS — I10 Essential (primary) hypertension: Secondary | ICD-10-CM

## 2023-07-25 DIAGNOSIS — Z3A36 36 weeks gestation of pregnancy: Secondary | ICD-10-CM | POA: Diagnosis not present

## 2023-07-25 NOTE — Progress Notes (Signed)
   NURSE VISIT- NST  SUBJECTIVE:  Cheyenne Moore is a 33 y.o. [phone] female at [redacted]w[redacted]d, here for a NST for pregnancy complicated by [facility] and Morbid obesity (BMI >=40).  She reports active fetal movement, contractions: none, vaginal bleeding: none, membranes: intact.   OBJECTIVE:  BP 111/74   Pulse (!) 101   LMP 11/15/2022 (Exact Date)   Appears well, no apparent distress  No results found for this or any previous visit (from the past 24 hours).  NST: FHR baseline 140 bpm, Variability: moderate, Accelerations:present, Decelerations:  Absent= Cat 1/reactive Toco: none   ASSESSMENT: [id number] at [redacted]w[redacted]d with CHTN and Morbid obesity (BMI >=40) NST reactive  PLAN: EFM strip reviewed by Dr. Jayne   Recommendations: keep next appointment as scheduled    Cheyenne Moore  07/25/2023 10:41 AM

## 2023-07-27 ENCOUNTER — Other Ambulatory Visit: Payer: Self-pay | Admitting: Obstetrics & Gynecology

## 2023-07-27 DIAGNOSIS — O10919 Unspecified pre-existing hypertension complicating pregnancy, unspecified trimester: Secondary | ICD-10-CM

## 2023-07-27 DIAGNOSIS — O0990 Supervision of high risk pregnancy, unspecified, unspecified trimester: Secondary | ICD-10-CM

## 2023-07-28 ENCOUNTER — Ambulatory Visit: Admitting: Obstetrics & Gynecology

## 2023-07-28 ENCOUNTER — Other Ambulatory Visit (HOSPITAL_COMMUNITY)
Admission: RE | Admit: 2023-07-28 | Discharge: 2023-07-28 | Disposition: A | Discharge status: Home / Self Care | Source: Ambulatory Visit | Attending: Obstetrics & Gynecology | Admitting: Obstetrics & Gynecology

## 2023-07-28 ENCOUNTER — Ambulatory Visit

## 2023-07-28 ENCOUNTER — Encounter: Payer: Self-pay | Admitting: Obstetrics & Gynecology

## 2023-07-28 VITALS — BP 134/86 | HR 91 | Wt 313.0 lb

## 2023-07-28 DIAGNOSIS — O0993 Supervision of high risk pregnancy, unspecified, third trimester: Secondary | ICD-10-CM

## 2023-07-28 DIAGNOSIS — Z3A36 36 weeks gestation of pregnancy: Secondary | ICD-10-CM

## 2023-07-28 DIAGNOSIS — Z1389 Encounter for screening for other disorder: Secondary | ICD-10-CM

## 2023-07-28 DIAGNOSIS — O99213 Obesity complicating pregnancy, third trimester: Secondary | ICD-10-CM | POA: Diagnosis not present

## 2023-07-28 DIAGNOSIS — O10913 Unspecified pre-existing hypertension complicating pregnancy, third trimester: Secondary | ICD-10-CM | POA: Diagnosis not present

## 2023-07-28 DIAGNOSIS — Z331 Pregnant state, incidental: Secondary | ICD-10-CM

## 2023-07-28 DIAGNOSIS — O10919 Unspecified pre-existing hypertension complicating pregnancy, unspecified trimester: Secondary | ICD-10-CM

## 2023-07-28 DIAGNOSIS — O0990 Supervision of high risk pregnancy, unspecified, unspecified trimester: Secondary | ICD-10-CM

## 2023-07-28 LAB — POCT URINALYSIS DIPSTICK OB
Blood, UA: NEGATIVE
Glucose, UA: NEGATIVE
Nitrite, UA: NEGATIVE
POC,PROTEIN,UA: NEGATIVE

## 2023-07-28 NOTE — Progress Notes (Signed)
 HIGH-RISK PREGNANCY VISIT Patient name: Cheyenne Moore MRN [id number]  Date of birth: [birth year] Chief Complaint:   Routine Prenatal Visit and Pregnancy Ultrasound  History of Present Illness:   Cheyenne Moore is a 33 y.o. H5E7987 female at [redacted]w[redacted]d with an Estimated Date of Delivery: 08/22/23 being seen today for ongoing management of a high-risk pregnancy complicated by     ICD-10-CM   1. [redacted] weeks gestation of pregnancy  Z3A.36 Cervicovaginal ancillary only    Culture, beta strep (group b only)    2. Supervision of high risk pregnancy in third trimester  O09.93 Cervicovaginal ancillary only    Culture, beta strep (group b only)    3. Pregnant state, incidental  Z33.1 POC Urinalysis Dipstick OB    4. Screening for genitourinary condition  Z13.89 POC Urinalysis Dipstick OB     .    Today she reports no complaints. Contractions: Not present.  .  Movement: Present. denies leaking of fluid.      02/07/2023    3:11 PM 07/02/2021    9:00 AM 02/02/2021    2:57 PM 07/24/2019    9:08 AM 04/11/2019    9:08 AM  Depression screen PHQ 2/9  Decreased Interest 1 0 0 0 0  Down, Depressed, Hopeless 1 0 0 0 0  PHQ - 2 Score 2 0 0 0 0  Altered sleeping 0 0 0 0 0  Tired, decreased energy 1 2 1 1  0  Change in appetite 0 0 1 0 0  Feeling bad or failure about yourself  0 0 0 0 0  Trouble concentrating 0 0 0 0 0  Moving slowly or fidgety/restless 0 0 0 0 0  Suicidal thoughts 0 0 0 0 0  PHQ-9 Score 3 2 2 1  0        02/07/2023    3:11 PM 07/02/2021    9:00 AM 02/02/2021    2:57 PM 07/24/2019    9:08 AM  GAD 7 : Generalized Anxiety Score  Nervous, Anxious, on Edge 1 2 1  0  Control/stop worrying 0 1 0 0  Worry too much - different things 1 1 0 0  Trouble relaxing 0 0 0 0  Restless 0 0 0 0  Easily annoyed or irritable 1 1 0 0  Afraid - awful might happen 0 1 0 0  Total GAD 7 Score 3 6 1  0     Review of Systems:   Pertinent items are noted in HPI Denies abnormal vaginal discharge w/  itching/odor/irritation, headaches, visual changes, shortness of breath, chest pain, abdominal pain, severe nausea/vomiting, or problems with urination or bowel movements unless otherwise stated above. Pertinent History Reviewed:  Reviewed past medical,surgical, social, obstetrical and family history.  Reviewed problem list, medications and allergies. Physical Assessment:   Vitals:   07/28/23 1135  BP: 135/89  Pulse: 99  Weight: (!) 313 lb (142 kg)  Body mass index is 50.52 kg/m.           Physical Examination:   General appearance: alert, well appearing, and in no distress  Mental status: alert, oriented to person, place, and time  Skin: warm & dry   Extremities: Edema: Trace    Cardiovascular: normal heart rate noted  Respiratory: normal respiratory effort, no distress  Abdomen: gravid, soft, non-tender  Pelvic: Cervical exam deferred         Fetal Status:     Movement: Present    Fetal Surveillance Testing today:  BPP 8/8  Chaperone: Alan Fischer    Results for orders placed or performed in visit on 07/28/23 (from the past 24 hours)  POC Urinalysis Dipstick OB   Collection Time: 07/28/23 11:49 AM  Result Value Ref Range   Color, UA     Clarity, UA     Glucose, UA Negative Negative   Bilirubin, UA     Ketones, UA trace    Spec Grav, UA     Blood, UA negative    pH, UA     POC,PROTEIN,UA Negative Negative, Trace, Small (1+), Moderate (2+), Large (3+), 4+   Urobilinogen, UA     Nitrite, UA negative    Leukocytes, UA Large (3+) (A) Negative   Appearance     Odor      Assessment & Plan:  High-risk pregnancy: H5E7987 at [redacted]w[redacted]d with an Estimated Date of Delivery: 08/22/23      ICD-10-CM   1. [redacted] weeks gestation of pregnancy  Z3A.36 Cervicovaginal ancillary only    Culture, beta strep (group b only)    2. Supervision of high risk pregnancy in third trimester  O09.93 Cervicovaginal ancillary only    Culture, beta strep (group b only)    3. Pregnant state,  incidental  Z33.1 POC Urinalysis Dipstick OB    4. Screening for genitourinary condition  Z13.89 POC Urinalysis Dipstick OB         Meds: No orders of the defined types were placed in this encounter.   Orders:  Orders Placed This Encounter  Procedures   Culture, beta strep (group b only)   POC Urinalysis Dipstick OB     Labs/procedures today: U/S  Treatment Plan:  cont current care plan   Follow-up: No follow-ups on file.   Future Appointments  Date Time Provider Department Center  07/31/2023  9:50 AM [facility] NURSE [facility]  08/03/2023 11:30 AM [facility] US  [facility] None  08/03/2023  1:30 PM Marilynn Nest, DO [facility]  08/07/2023 10:30 AM [facility] NURSE [facility]  08/10/2023 10:45 AM [facility] US  [facility] None  08/10/2023 11:50 AM Kizzie Suzen SAUNDERS, CNM [facility]  08/14/2023  9:50 AM [facility] NURSE [facility]  08/17/2023  9:15 AM [facility] US  [facility] None  08/17/2023 10:10 AM Kizzie Suzen SAUNDERS, CNM [facility]  08/21/2023 10:30 AM [facility] NURSE [facility]    Orders Placed This Encounter  Procedures   Culture, beta strep (group b only)   POC Urinalysis Dipstick OB   Vonn VEAR Inch  Attending Physician for the Center for [facility] 07/28/2023 11:51 AM

## 2023-07-28 NOTE — Progress Notes (Signed)
 US  36+3 wks,complete breech,FHR 140 bpm,posterior fundal placenta gr 2,mild left renal pelvic dilatation 7 mm,normal RK,AFI 15 cm,BPP 8/8

## 2023-07-31 ENCOUNTER — Ambulatory Visit: Admitting: *Deleted

## 2023-07-31 VITALS — BP 132/89 | HR 84

## 2023-07-31 DIAGNOSIS — Z3A36 36 weeks gestation of pregnancy: Secondary | ICD-10-CM

## 2023-07-31 DIAGNOSIS — I10 Essential (primary) hypertension: Secondary | ICD-10-CM | POA: Diagnosis not present

## 2023-07-31 LAB — CERVICOVAGINAL ANCILLARY ONLY
Chlamydia: NEGATIVE
Comment: NEGATIVE
Comment: NORMAL
Neisseria Gonorrhea: NEGATIVE

## 2023-07-31 NOTE — Progress Notes (Signed)
   NURSE VISIT- NST  SUBJECTIVE:  Cheyenne Moore is a 33 y.o. [phone] female at [redacted]w[redacted]d, here for a NST for pregnancy complicated by [facility].  She reports active fetal movement, contractions: none, vaginal bleeding: none, membranes: intact.   OBJECTIVE:  BP 132/89   Pulse 84   LMP 11/15/2022 (Exact Date)   Appears well, no apparent distress  No results found for this or any previous visit (from the past 24 hours).  NST: FHR baseline 130 bpm, Variability: moderate, Accelerations:present, Decelerations:  Absent= Cat 1/reactive Toco: none   ASSESSMENT: H5E7987 at [redacted]w[redacted]d with CHTN NST reactive  PLAN: EFM strip reviewed by Dr. Jayne   Recommendations: keep next appointment as scheduled    Cheyenne Moore  07/31/2023 1:01 PM

## 2023-08-01 LAB — CULTURE, BETA STREP (GROUP B ONLY)

## 2023-08-02 ENCOUNTER — Other Ambulatory Visit: Payer: Self-pay | Admitting: Obstetrics & Gynecology

## 2023-08-02 DIAGNOSIS — O10919 Unspecified pre-existing hypertension complicating pregnancy, unspecified trimester: Secondary | ICD-10-CM

## 2023-08-02 DIAGNOSIS — O0990 Supervision of high risk pregnancy, unspecified, unspecified trimester: Secondary | ICD-10-CM

## 2023-08-03 ENCOUNTER — Ambulatory Visit: Admitting: Obstetrics & Gynecology

## 2023-08-03 ENCOUNTER — Other Ambulatory Visit

## 2023-08-03 ENCOUNTER — Encounter: Payer: Self-pay | Admitting: Obstetrics & Gynecology

## 2023-08-03 VITALS — BP 125/85 | HR 86 | Wt 314.2 lb

## 2023-08-03 DIAGNOSIS — O0993 Supervision of high risk pregnancy, unspecified, third trimester: Secondary | ICD-10-CM | POA: Diagnosis not present

## 2023-08-03 DIAGNOSIS — O10913 Unspecified pre-existing hypertension complicating pregnancy, third trimester: Secondary | ICD-10-CM | POA: Diagnosis not present

## 2023-08-03 DIAGNOSIS — O0990 Supervision of high risk pregnancy, unspecified, unspecified trimester: Secondary | ICD-10-CM

## 2023-08-03 DIAGNOSIS — O99213 Obesity complicating pregnancy, third trimester: Secondary | ICD-10-CM

## 2023-08-03 DIAGNOSIS — E038 Other specified hypothyroidism: Secondary | ICD-10-CM | POA: Diagnosis not present

## 2023-08-03 DIAGNOSIS — O10919 Unspecified pre-existing hypertension complicating pregnancy, unspecified trimester: Secondary | ICD-10-CM

## 2023-08-03 DIAGNOSIS — Z3A37 37 weeks gestation of pregnancy: Secondary | ICD-10-CM

## 2023-08-03 NOTE — Progress Notes (Signed)
 HIGH-RISK PREGNANCY VISIT Patient name: Cheyenne Moore MRN [id number]  Date of birth: 1990-06-28 Chief Complaint:   Routine Prenatal Visit  History of Present Illness:   Cheyenne Moore is a 33 y.o. H5E7987 female at [redacted]w[redacted]d with an Estimated Date of Delivery: 08/22/23 being seen today for ongoing management of a high-risk pregnancy complicated by:  -Chronic HTN  No meds  -Obesity -Hypothyroidism  on armour thyroid  90mg  daily  Today she reports no complaints.   Contractions: Not present. Vag. Bleeding: None.  Movement: Present. denies leaking of fluid.      02/07/2023    3:11 PM 07/02/2021    9:00 AM 02/02/2021    2:57 PM 07/24/2019    9:08 AM 04/11/2019    9:08 AM  Depression screen PHQ 2/9  Decreased Interest 1 0 0 0 0  Down, Depressed, Hopeless 1 0 0 0 0  PHQ - 2 Score 2 0 0 0 0  Altered sleeping 0 0 0 0 0  Tired, decreased energy 1 2 1 1  0  Change in appetite 0 0 1 0 0  Feeling bad or failure about yourself  0 0 0 0 0  Trouble concentrating 0 0 0 0 0  Moving slowly or fidgety/restless 0 0 0 0 0  Suicidal thoughts 0 0 0 0 0  PHQ-9 Score 3 2 2 1  0     Current Outpatient Medications  Medication Instructions   acetaminophen  (TYLENOL ) 650 mg, Oral, Every 4 hours PRN   aspirin  EC 162 mg, Oral, Daily, Swallow whole.   cholecalciferol (VITAMIN D3) 1,000 Units, Daily   FeroSul 325 mg, Oral, Every other day   loratadine (CLARITIN) 10 mg, Daily   omeprazole  (PRILOSEC) 10 mg, Oral, Daily   Prenatal Vit-Fe Fumarate-FA (PRENATAL VITAMIN PO) Take by mouth.   promethazine  (PHENERGAN ) 12.5-25 mg, Oral, Every 6 hours PRN   thyroid  (ARMOUR THYROID ) 90 mg, Oral, Daily before breakfast     Review of Systems:   Pertinent items are noted in HPI Denies abnormal vaginal discharge w/ itching/odor/irritation, headaches, visual changes, shortness of breath, chest pain, abdominal pain, severe nausea/vomiting, or problems with urination or bowel movements unless otherwise stated  above. Pertinent History Reviewed:  Reviewed past medical,surgical, social, obstetrical and family history.  Reviewed problem list, medications and allergies. Physical Assessment:   Vitals:   08/03/23 1219  BP: 125/85  Pulse: 86  Weight: (!) 314 lb 3.2 oz (142.5 kg)  Body mass index is 50.71 kg/m.           Physical Examination:   General appearance: alert, well appearing, and in no distress  Mental status: normal mood, behavior, speech, dress, motor activity, and thought processes  Skin: warm & dry   Extremities:      Cardiovascular: normal heart rate noted  Respiratory: normal respiratory effort, no distress  Abdomen: gravid, soft, non-tender  Pelvic: Cervical exam performed  Dilation: Closed Effacement (%): Thick Station: -3  Fetal Status:     Movement: Present    Fetal Surveillance Testing today: cephalic,fundal placenta gr 3,BPP 8/8,FHR 137 bpm,left renal pelvis WNL 6 mm,RK WNL,AFI 14 cm    Chaperone: pt declined    No results found for this or any previous visit (from the past 24 hours).   Assessment & Plan:  High-risk pregnancy: H5E7987 at [redacted]w[redacted]d with an Estimated Date of Delivery: 08/22/23   1) Chronic HTN -stable no meds -BPP 8/8 -IOL scheduled for 7/31 @ midnight  2) Obesity BMI 50 -continue  antepartum testing   3) hypothyroidism Continue current medication   Meds: No orders of the defined types were placed in this encounter.   Labs/procedures today: BPP  Treatment Plan:  routine OB care and as outlined above  Reviewed: Term labor symptoms and general obstetric precautions including but not limited to vaginal bleeding, contractions, leaking of fluid and fetal movement were reviewed in detail with the patient.  All questions were answered. Pt has home bp cuff. Check bp weekly, let us  know if >140/90.   Follow-up: Return for twice weekly as scheduled.   Future Appointments  Date Time Provider Department Center  08/03/2023  1:30 PM Marilynn Nest, DO  [facility]  08/07/2023 10:30 AM [facility] NURSE [facility]  08/10/2023 10:45 AM [facility] - [facility] US  [facility] None  08/10/2023 11:50 AM Kizzie Suzen SAUNDERS, CNM [facility]  08/14/2023  9:50 AM [facility] NURSE [facility]  08/17/2023 12:00 AM [facility]-LD SCHED ROOM [facility]-INDC None  08/17/2023  9:15 AM [facility] - [facility] US  [facility] None  08/17/2023 10:10 AM Kizzie Suzen SAUNDERS, CNM [facility]  08/21/2023 10:30 AM [facility] NURSE [facility]    No orders of the defined types were placed in this encounter.   Jameis Newsham, DO Attending Obstetrician & Gynecologist, [facility] for [employer], [facility]

## 2023-08-03 NOTE — Progress Notes (Signed)
 US  37+2 wks,cephalic,fundal placenta gr 3,BPP 8/8,FHR 137 bpm,left renal pelvis WNL 6 mm,RK WNL,AFI 14 cm

## 2023-08-04 ENCOUNTER — Other Ambulatory Visit

## 2023-08-07 ENCOUNTER — Ambulatory Visit: Admitting: *Deleted

## 2023-08-07 VITALS — BP 112/76 | HR 105

## 2023-08-07 DIAGNOSIS — Z3A37 37 weeks gestation of pregnancy: Secondary | ICD-10-CM | POA: Diagnosis not present

## 2023-08-07 DIAGNOSIS — I10 Essential (primary) hypertension: Secondary | ICD-10-CM | POA: Diagnosis not present

## 2023-08-07 DIAGNOSIS — O0993 Supervision of high risk pregnancy, unspecified, third trimester: Secondary | ICD-10-CM | POA: Diagnosis not present

## 2023-08-07 NOTE — Progress Notes (Signed)
   NURSE VISIT- NST  SUBJECTIVE:  Cheyenne Moore is a 33 y.o. [phone] female at 109w6d, here for a NST for pregnancy complicated by [facility] and Morbid obesity (BMI >=40).  She reports active fetal movement, contractions: none, vaginal bleeding: none, membranes: intact.   OBJECTIVE:  BP 112/76   Pulse (!) 105   LMP 11/15/2022 (Exact Date)   Appears well, no apparent distress  No results found for this or any previous visit (from the past 24 hours).  NST: FHR baseline 140 bpm, Variability: moderate, Accelerations:present, Decelerations:  Absent= Cat 1/reactive Toco: none   ASSESSMENT: H5E7987 at [redacted]w[redacted]d with CHTN and Morbid obesity (BMI >=40) NST reactive  PLAN: EFM strip reviewed by Dr. Jayne   Recommendations: keep next appointment as scheduled    Rutherford Rover  08/07/2023 11:34 AM

## 2023-08-09 ENCOUNTER — Other Ambulatory Visit: Payer: Self-pay | Admitting: Obstetrics & Gynecology

## 2023-08-09 DIAGNOSIS — O10919 Unspecified pre-existing hypertension complicating pregnancy, unspecified trimester: Secondary | ICD-10-CM

## 2023-08-09 DIAGNOSIS — O0990 Supervision of high risk pregnancy, unspecified, unspecified trimester: Secondary | ICD-10-CM

## 2023-08-10 ENCOUNTER — Ambulatory Visit (INDEPENDENT_AMBULATORY_CARE_PROVIDER_SITE_OTHER)

## 2023-08-10 ENCOUNTER — Ambulatory Visit: Admitting: Women's Health

## 2023-08-10 ENCOUNTER — Telehealth (HOSPITAL_COMMUNITY): Payer: Self-pay | Admitting: *Deleted

## 2023-08-10 ENCOUNTER — Encounter: Payer: Self-pay | Admitting: Women's Health

## 2023-08-10 VITALS — BP 138/86 | HR 98 | Wt 309.0 lb

## 2023-08-10 DIAGNOSIS — O10919 Unspecified pre-existing hypertension complicating pregnancy, unspecified trimester: Secondary | ICD-10-CM

## 2023-08-10 DIAGNOSIS — Z6841 Body Mass Index (BMI) 40.0 and over, adult: Secondary | ICD-10-CM | POA: Diagnosis not present

## 2023-08-10 DIAGNOSIS — O10913 Unspecified pre-existing hypertension complicating pregnancy, third trimester: Secondary | ICD-10-CM | POA: Diagnosis not present

## 2023-08-10 DIAGNOSIS — O0993 Supervision of high risk pregnancy, unspecified, third trimester: Secondary | ICD-10-CM | POA: Diagnosis not present

## 2023-08-10 DIAGNOSIS — Z3A38 38 weeks gestation of pregnancy: Secondary | ICD-10-CM | POA: Diagnosis not present

## 2023-08-10 DIAGNOSIS — O0990 Supervision of high risk pregnancy, unspecified, unspecified trimester: Secondary | ICD-10-CM

## 2023-08-10 DIAGNOSIS — O99213 Obesity complicating pregnancy, third trimester: Secondary | ICD-10-CM

## 2023-08-10 NOTE — Progress Notes (Signed)
 US  38+2 wks,cephalic,BPP 8/8,fundal placenta gr 3,FHR 150 bpm,mild bilateral hydronephrosis,right renal pelvis 8 mm,left renal pelvis 9 mm,fetal full bladder during US ,BPP 8/8,EFW 3610 g 77%,AC 97%

## 2023-08-10 NOTE — Telephone Encounter (Signed)
 Preadmission screen

## 2023-08-10 NOTE — Patient Instructions (Signed)
 Cheris, thank you for choosing our office today! We appreciate the opportunity to meet your healthcare needs. You may receive a short survey by mail, e-mail, or through [employer]. If you are happy with your care we would appreciate if you could take just a few minutes to complete the survey questions. We read all of your comments and take your feedback very seriously. Thank you again for choosing our office.  Center for [employer] Team at [facility]  [facility] at [facility] ([address] [place], [place] [address]) Entrance C, located off of [address] Free 24/7 valet parking   CLASSES: Go to [email or web address] to register for classes (childbirth, breastfeeding, waterbirth, infant CPR, daddy bootcamp, etc.)  Call the office [phone]) or go to [facility] if: You begin to have strong, frequent contractions Your water breaks.  Sometimes it is a big gush of fluid, sometimes it is just a trickle that keeps getting your panties wet or running down your legs You have vaginal bleeding.  It is normal to have a small amount of spotting if your cervix was checked.  You don't feel your baby moving like normal.  If you don't, get you something to eat and drink and lay down and focus on feeling your baby move.   If your baby is still not moving like normal, you should call the office or go to [facility].  Call the office [phone]) or go to [facility] hospital for these signs of pre-eclampsia: Severe headache that does not go away with Tylenol  Visual changes- seeing spots, double, blurred vision Pain under your right breast or upper abdomen that does not go away with Tums or heartburn medicine Nausea and/or vomiting Severe swelling in your hands, feet, and face   [place] [facility]/Family Doctors [facility]): [address] Luba BROCKS, [phone]           [facility]: [address], [phone]                 [facility]): [address], [phone] (call to ask if accepting patients) [facility] Department: [address], [place], [phone]    [facility] Pediatricians/Family Doctors [facility]): [address], [phone] [facility]: [address] [place], [phone] [facility]: [address], [phone]  [facility] Doctors  [facility]): [phone] [facility]: [address], [phone]   [facility] Doctors [facility]: [address] [phone]  [facility] Doctors  [facility]: [phone], [phone]  Home Blood Pressure Monitoring for Patients   Your provider has recommended that you check your blood pressure (BP) at least once a week at home. If you do not have a blood pressure cuff at home, one will be provided for you. Contact your provider if you have not received your monitor within 1 week.   Helpful Tips for Accurate Home Blood Pressure Checks  Don't smoke, exercise, or drink caffeine 30 minutes before checking your BP Use the restroom before checking your BP (a full bladder can raise your pressure) Relax in a comfortable upright chair Feet on the ground Left arm resting comfortably on a flat surface at the level of your heart Legs uncrossed Back supported Sit quietly and don't talk Place the cuff on your bare arm Adjust snuggly, so that only two fingertips  can fit between your skin and the top of the cuff Check 2 readings separated by at least one minute Keep a log of your BP readings For a visual, please reference this diagram: http://ccnc.care/bpdiagram  Provider Name: [facility]     Phone: [phone]  Zone 1: ALL CLEAR  Continue to monitor your symptoms:  BP reading is less than 140 (top number) or less than 90 (bottom number)  No right  upper stomach pain No headaches or seeing spots No feeling nauseated or throwing up No swelling in face and hands  Zone 2: CAUTION Call your doctor's office for any of the following:  BP reading is greater than 140 (top number) or greater than 90 (bottom number)  Stomach pain under your ribs in the middle or right side Headaches or seeing spots Feeling nauseated or throwing up Swelling in face and hands  Zone 3: EMERGENCY  Seek immediate medical care if you have any of the following:  BP reading is greater than160 (top number) or greater than 110 (bottom number) Severe headaches not improving with Tylenol  Serious difficulty catching your breath Any worsening symptoms from Zone 2   Braxton Hicks Contractions Contractions of the uterus can occur throughout pregnancy, but they are not always a sign that you are in labor. You may have practice contractions called Braxton Hicks contractions. These false labor contractions are sometimes confused with true labor. What are Darol Irving contractions? Braxton Hicks contractions are tightening movements that occur in the muscles of the uterus before labor. Unlike true labor contractions, these contractions do not result in opening (dilation) and thinning of the cervix. Toward the end of pregnancy (32-34 weeks), Braxton Hicks contractions can happen more often and may become stronger. These contractions are sometimes difficult to tell apart from true labor because they can be very uncomfortable. You should not feel embarrassed if you go to the hospital with false labor. Sometimes, the only way to tell if you are in true labor is for your health care provider to look for changes in the cervix. The health care provider will do a physical exam and may monitor your contractions. If you are not in true labor, the exam should show that your cervix is not dilating and your water has not broken. If there are no other health problems associated with your  pregnancy, it is completely safe for you to be sent home with false labor. You may continue to have Braxton Hicks contractions until you go into true labor. How to tell the difference between true labor and false labor True labor Contractions last 30-70 seconds. Contractions become very regular. Discomfort is usually felt in the top of the uterus, and it spreads to the lower abdomen and low back. Contractions do not go away with walking. Contractions usually become more intense and increase in frequency. The cervix dilates and gets thinner. False labor Contractions are usually shorter and not as strong as true labor contractions. Contractions are usually irregular. Contractions are often felt in the front of the lower abdomen and in the groin. Contractions may go away when you walk around or change positions while lying down. Contractions get weaker and are shorter-lasting as time goes on. The cervix usually does not dilate or become thin. Follow these instructions at home:  Take over-the-counter and prescription medicines only as told by your health care provider. Keep up with your usual exercises and follow other instructions from your health care provider. Eat and drink lightly if you think  you are going into labor. If Braxton Hicks contractions are making you uncomfortable: Change your position from lying down or resting to walking, or change from walking to resting. Sit and rest in a tub of warm water. Drink enough fluid to keep your urine pale yellow. Dehydration may cause these contractions. Do slow and deep breathing several times an hour. Keep all follow-up prenatal visits as told by your health care provider. This is important. Contact a health care provider if: You have a fever. You have continuous pain in your abdomen. Get help right away if: Your contractions become stronger, more regular, and closer together. You have fluid leaking or gushing from your vagina. You pass  blood-tinged mucus (bloody show). You have bleeding from your vagina. You have low back pain that you never had before. You feel your baby's head pushing down and causing pelvic pressure. Your baby is not moving inside you as much as it used to. Summary Contractions that occur before labor are called Braxton Hicks contractions, false labor, or practice contractions. Braxton Hicks contractions are usually shorter, weaker, farther apart, and less regular than true labor contractions. True labor contractions usually become progressively stronger and regular, and they become more frequent. Manage discomfort from [facility] contractions by changing position, resting in a warm bath, drinking plenty of water, or practicing deep breathing. This information is not intended to replace advice given to you by your health care provider. Make sure you discuss any questions you have with your health care provider. Document Revised: 12/16/2016 Document Reviewed: 05/19/2016 Elsevier Patient Education  2020 [employer].

## 2023-08-10 NOTE — Progress Notes (Signed)
 HIGH-RISK PREGNANCY VISIT Patient name: Cheyenne Moore MRN [id number]  Date of birth: 24-Sep-1990 Chief Complaint:   Routine Prenatal Visit  History of Present Illness:   Cheyenne Moore is a 33 y.o. [id number] female at [redacted]w[redacted]d with an Estimated Date of Delivery: 08/22/23 being seen today for ongoing management of a high-risk pregnancy complicated by chronic hypertension currently on no meds and PG BMI 46.    Today she reports no complaints. Contractions: Not present. Vag. Bleeding: None.  Movement: Present. denies leaking of fluid.      02/07/2023    3:11 PM 07/02/2021    9:00 AM 02/02/2021    2:57 PM 07/24/2019    9:08 AM 04/11/2019    9:08 AM  Depression screen PHQ 2/9  Decreased Interest 1 0 0 0 0  Down, Depressed, Hopeless 1 0 0 0 0  PHQ - 2 Score 2 0 0 0 0  Altered sleeping 0 0 0 0 0  Tired, decreased energy 1 2 1 1  0  Change in appetite 0 0 1 0 0  Feeling bad or failure about yourself  0 0 0 0 0  Trouble concentrating 0 0 0 0 0  Moving slowly or fidgety/restless 0 0 0 0 0  Suicidal thoughts 0 0 0 0 0  PHQ-9 Score 3 2 2 1  0        02/07/2023    3:11 PM 07/02/2021    9:00 AM 02/02/2021    2:57 PM 07/24/2019    9:08 AM  GAD 7 : Generalized Anxiety Score  Nervous, Anxious, on Edge 1 2 1  0  Control/stop worrying 0 1 0 0  Worry too much - different things 1 1 0 0  Trouble relaxing 0 0 0 0  Restless 0 0 0 0  Easily annoyed or irritable 1 1 0 0  Afraid - awful might happen 0 1 0 0  Total GAD 7 Score 3 6 1  0     Review of Systems:   Pertinent items are noted in HPI Denies abnormal vaginal discharge w/ itching/odor/irritation, headaches, visual changes, shortness of breath, chest pain, abdominal pain, severe nausea/vomiting, or problems with urination or bowel movements unless otherwise stated above. Pertinent History Reviewed:  Reviewed past medical,surgical, social, obstetrical and family history.  Reviewed problem list, medications and allergies. Physical Assessment:    Vitals:   08/10/23 1134  BP: 138/86  Pulse: 98  Weight: (!) 309 lb (140.2 kg)  Body mass index is 49.87 kg/m.           Physical Examination:   General appearance: alert, well appearing, and in no distress  Mental status: alert, oriented to person, place, and time  Skin: warm & dry   Extremities:      Cardiovascular: normal heart rate noted  Respiratory: normal respiratory effort, no distress  Abdomen: gravid, soft, non-tender  Pelvic: Cervical exam performed  Dilation: 1 Effacement (%): 50 Station: -3  Fetal Status:     Movement: Present Presentation: Vertex  Fetal Surveillance Testing today: US  38+2 wks,cephalic,BPP 8/8,fundal placenta gr 3,FHR 150 bpm,mild bilateral hydronephrosis,right renal pelvis 8 mm,left renal pelvis 9 mm,fetal full bladder during US ,BPP 8/8,EFW 3610 g 77%,AC 97%   Chaperone: Aleck Blase  No results found for this or any previous visit (from the past 24 hours).  Assessment & Plan:  High-risk pregnancy: [id number] at [redacted]w[redacted]d with an Estimated Date of Delivery: 08/22/23   1) CHTN, no meds, ASA, check bp daily, if >140/90 or  pre-e s/s, let us  know  2) PGBMI 46, currently 49  3) Hypothyroid> on meds, last TSH 3.390  4) Fetal bilateral RPD> 8/61mm, discussed, notify peds pp  Meds: No orders of the defined types were placed in this encounter.   Labs/procedures today: SVE and U/S  Treatment Plan:  2x/wk testing   IOL 7/31 as scheduled  Reviewed: Term labor symptoms and general obstetric precautions including but not limited to vaginal bleeding, contractions, leaking of fluid and fetal movement were reviewed in detail with the patient.  All questions were answered. Does have home bp cuff. Office bp cuff given: not applicable. Check bp daily, let us  know if consistently >140 and/or >90.  Follow-up: No follow-ups on file.   Future Appointments  Date Time Provider Department Center  08/14/2023  9:50 AM [facility] NURSE [facility]  08/17/2023 12:00  AM [facility] SCHED ROOM [facility] None    No orders of the defined types were placed in this encounter.  Suzen JONELLE Fetters CNM, [facility] 08/10/2023 11:57 AM

## 2023-08-11 ENCOUNTER — Telehealth (HOSPITAL_COMMUNITY): Payer: Self-pay | Admitting: *Deleted

## 2023-08-11 ENCOUNTER — Encounter (HOSPITAL_COMMUNITY): Payer: Self-pay | Admitting: *Deleted

## 2023-08-11 NOTE — Telephone Encounter (Signed)
 Preadmission screen

## 2023-08-14 ENCOUNTER — Encounter: Payer: Self-pay | Admitting: *Deleted

## 2023-08-14 ENCOUNTER — Ambulatory Visit: Admitting: *Deleted

## 2023-08-14 VITALS — BP 119/81 | HR 86 | Wt 313.8 lb

## 2023-08-14 DIAGNOSIS — Z3A38 38 weeks gestation of pregnancy: Secondary | ICD-10-CM | POA: Diagnosis not present

## 2023-08-14 DIAGNOSIS — O10919 Unspecified pre-existing hypertension complicating pregnancy, unspecified trimester: Secondary | ICD-10-CM

## 2023-08-14 DIAGNOSIS — O0993 Supervision of high risk pregnancy, unspecified, third trimester: Secondary | ICD-10-CM | POA: Diagnosis not present

## 2023-08-14 NOTE — Progress Notes (Signed)
   NURSE VISIT- NST  SUBJECTIVE:  Cheyenne Moore is a 33 y.o. [phone] female at [redacted]w[redacted]d, here for a NST for pregnancy complicated by [facility].  She reports active fetal movement, contractions: none, vaginal bleeding: none, membranes: intact.   OBJECTIVE:  BP 119/81   Pulse 86   Wt (!) 313 lb 12.8 oz (142.3 kg)   LMP 11/15/2022 (Exact Date)   BMI 50.65 kg/m  Pt's 1st BP was 129/90 pulse 85. Appears well, no apparent distress  No results found for this or any previous visit (from the past 24 hours).  NST: FHR baseline 135 bpm, Variability: moderate, Accelerations:present, Decelerations:  Absent= Cat 1/reactive Toco: none   ASSESSMENT: H5E7987 at [redacted]w[redacted]d with CHTN NST reactive  PLAN: EFM strip reviewed by Dr. Ozan   Recommendations: keep next appointment as scheduled    Cheyenne Moore  08/14/2023 12:41 PM

## 2023-08-17 ENCOUNTER — Encounter: Admitting: Women's Health

## 2023-08-17 ENCOUNTER — Other Ambulatory Visit

## 2023-08-17 ENCOUNTER — Other Ambulatory Visit: Payer: Self-pay

## 2023-08-17 ENCOUNTER — Inpatient Hospital Stay (HOSPITAL_COMMUNITY)

## 2023-08-17 ENCOUNTER — Encounter (HOSPITAL_COMMUNITY): Payer: Self-pay

## 2023-08-17 ENCOUNTER — Inpatient Hospital Stay (HOSPITAL_COMMUNITY)
Admission: AD | Admit: 2023-08-17 | Discharge: 2023-08-19 | DRG: 807 | Disposition: A | Discharge status: Home / Self Care | Attending: Obstetrics & Gynecology | Admitting: Obstetrics & Gynecology

## 2023-08-17 ENCOUNTER — Encounter (HOSPITAL_COMMUNITY): Payer: Self-pay | Admitting: Obstetrics & Gynecology

## 2023-08-17 ENCOUNTER — Inpatient Hospital Stay (HOSPITAL_COMMUNITY): Admitting: Anesthesiology

## 2023-08-17 DIAGNOSIS — O0993 Supervision of high risk pregnancy, unspecified, third trimester: Secondary | ICD-10-CM

## 2023-08-17 DIAGNOSIS — Z7982 Long term (current) use of aspirin: Secondary | ICD-10-CM

## 2023-08-17 DIAGNOSIS — O99284 Endocrine, nutritional and metabolic diseases complicating childbirth: Secondary | ICD-10-CM | POA: Diagnosis present

## 2023-08-17 DIAGNOSIS — Z833 Family history of diabetes mellitus: Secondary | ICD-10-CM

## 2023-08-17 DIAGNOSIS — O10919 Unspecified pre-existing hypertension complicating pregnancy, unspecified trimester: Principal | ICD-10-CM | POA: Diagnosis present

## 2023-08-17 DIAGNOSIS — O0990 Supervision of high risk pregnancy, unspecified, unspecified trimester: Secondary | ICD-10-CM

## 2023-08-17 DIAGNOSIS — Z3A39 39 weeks gestation of pregnancy: Secondary | ICD-10-CM | POA: Diagnosis not present

## 2023-08-17 DIAGNOSIS — O358XX0 Maternal care for other (suspected) fetal abnormality and damage, not applicable or unspecified: Secondary | ICD-10-CM | POA: Diagnosis present

## 2023-08-17 DIAGNOSIS — O9962 Diseases of the digestive system complicating childbirth: Secondary | ICD-10-CM | POA: Diagnosis present

## 2023-08-17 DIAGNOSIS — O320XX0 Maternal care for unstable lie, not applicable or unspecified: Secondary | ICD-10-CM | POA: Diagnosis present

## 2023-08-17 DIAGNOSIS — O99214 Obesity complicating childbirth: Secondary | ICD-10-CM | POA: Diagnosis present

## 2023-08-17 DIAGNOSIS — O1092 Unspecified pre-existing hypertension complicating childbirth: Secondary | ICD-10-CM | POA: Diagnosis present

## 2023-08-17 DIAGNOSIS — Z3493 Encounter for supervision of normal pregnancy, unspecified, third trimester: Secondary | ICD-10-CM

## 2023-08-17 DIAGNOSIS — E039 Hypothyroidism, unspecified: Secondary | ICD-10-CM | POA: Diagnosis present

## 2023-08-17 DIAGNOSIS — O1002 Pre-existing essential hypertension complicating childbirth: Secondary | ICD-10-CM | POA: Diagnosis not present

## 2023-08-17 DIAGNOSIS — Z8249 Family history of ischemic heart disease and other diseases of the circulatory system: Secondary | ICD-10-CM | POA: Diagnosis not present

## 2023-08-17 DIAGNOSIS — O329XX0 Maternal care for malpresentation of fetus, unspecified, not applicable or unspecified: Secondary | ICD-10-CM | POA: Diagnosis not present

## 2023-08-17 DIAGNOSIS — K219 Gastro-esophageal reflux disease without esophagitis: Secondary | ICD-10-CM | POA: Diagnosis present

## 2023-08-17 LAB — BASIC METABOLIC PANEL WITH GFR
Anion gap: 5 (ref 5–15)
BUN: 6 mg/dL (ref 6–20)
CO2: 20 mmol/L — ABNORMAL LOW (ref 22–32)
Calcium: 9.4 mg/dL (ref 8.9–10.3)
Chloride: 112 mmol/L — ABNORMAL HIGH (ref 98–111)
Creatinine, Ser: 0.33 mg/dL — ABNORMAL LOW (ref 0.44–1.00)
GFR, Estimated: >60 mL/min (ref >60)
Glucose, Bld: 96 mg/dL (ref 70–99)
Potassium: 3.7 mmol/L (ref 3.5–5.1)
Sodium: 137 mmol/L (ref 135–145)

## 2023-08-17 LAB — TYPE AND SCREEN
ABO/RH(D): O POS
Antibody Screen: NEGATIVE

## 2023-08-17 LAB — CBC
HCT: 36 % (ref 36.0–46.0)
HCT: 34.3 % — ABNORMAL LOW (ref 36.0–46.0)
Hemoglobin: 12.1 g/dL (ref 12.0–15.0)
Hemoglobin: 12 g/dL (ref 12.0–15.0)
MCH: 29.9 pg (ref 26.0–34.0)
MCH: 30.8 pg (ref 26.0–34.0)
MCHC: 33.6 g/dL (ref 30.0–36.0)
MCHC: 35 g/dL (ref 30.0–36.0)
MCV: 88.9 fL (ref 80.0–100.0)
MCV: 87.9 fL (ref 80.0–100.0)
Platelets: 223 K/uL (ref 150–400)
Platelets: 194 K/uL (ref 150–400)
RBC: 4.05 MIL/uL (ref 3.87–5.11)
RBC: 3.9 MIL/uL (ref 3.87–5.11)
RDW: 13.9 % (ref 11.5–15.5)
RDW: 14 % (ref 11.5–15.5)
WBC: 11.1 K/uL — ABNORMAL HIGH (ref 4.0–10.5)
WBC: 10.1 K/uL (ref 4.0–10.5)
nRBC: 0 % (ref 0.0–0.2)
nRBC: 0 % (ref 0.0–0.2)

## 2023-08-17 LAB — RPR: RPR Ser Ql: NONREACTIVE

## 2023-08-17 MED ORDER — LACTATED RINGERS IV SOLN: INTRAVENOUS | Status: DC

## 2023-08-17 MED ORDER — OXYTOCIN-SODIUM CHLORIDE 30-0.9 UT/500ML-% IV SOLN
2.5000 [IU]/h | INTRAVENOUS | Status: DC
  Filled 2023-08-17: qty 500

## 2023-08-17 MED ORDER — OXYCODONE-ACETAMINOPHEN 5-325 MG PO TABS: 2.0000 | ORAL_TABLET | ORAL | Status: DC | PRN

## 2023-08-17 MED ORDER — LIDOCAINE HCL (PF) 1 % IJ SOLN: 30.0000 mL | INTRAMUSCULAR | Status: DC | PRN

## 2023-08-17 MED ORDER — ONDANSETRON HCL 4 MG/2ML IJ SOLN: 4.0000 mg | Freq: Four times a day (QID) | INTRAMUSCULAR | Status: DC | PRN

## 2023-08-17 MED ORDER — LACTATED RINGERS IV SOLN: 500.0000 mL | INTRAVENOUS | Status: DC | PRN

## 2023-08-17 MED ORDER — OXYCODONE-ACETAMINOPHEN 5-325 MG PO TABS: 1.0000 | ORAL_TABLET | ORAL | Status: DC | PRN

## 2023-08-17 MED ORDER — OXYTOCIN-SODIUM CHLORIDE 30-0.9 UT/500ML-% IV SOLN
1.0000 m[IU]/min | INTRAVENOUS | Status: DC
  Administered 2023-08-17: 2 m[IU]/min via INTRAVENOUS
  Administered 2023-08-18: 30 m[IU]/min via INTRAVENOUS
  Filled 2023-08-17: qty 500

## 2023-08-17 MED ORDER — VITAMIN D 25 MCG (1000 UNIT) PO TABS
1000.0000 [IU] | ORAL_TABLET | Freq: Every day | ORAL | Status: DC
  Administered 2023-08-17: 1000 [IU] via ORAL
  Filled 2023-08-17 (×2): qty 1

## 2023-08-17 MED ORDER — PHENYLEPHRINE 80 MCG/ML (10ML) SYRINGE FOR IV PUSH (FOR BLOOD PRESSURE SUPPORT): 80.0000 ug | PREFILLED_SYRINGE | INTRAVENOUS | Status: DC | PRN

## 2023-08-17 MED ORDER — MISOPROSTOL 50MCG HALF TABLET
50.0000 ug | ORAL_TABLET | Freq: Once | ORAL | Status: AC
  Administered 2023-08-17: 50 ug via ORAL
  Filled 2023-08-17: qty 1

## 2023-08-17 MED ORDER — PANTOPRAZOLE SODIUM 40 MG PO TBEC: 40.0000 mg | DELAYED_RELEASE_TABLET | Freq: Every day | ORAL | Status: DC

## 2023-08-17 MED ORDER — FENTANYL-BUPIVACAINE-NACL 0.5-0.125-0.9 MG/250ML-% EP SOLN
12.0000 mL/h | EPIDURAL | Status: DC | PRN
  Administered 2023-08-17: 12 mL/h via EPIDURAL
  Filled 2023-08-17: qty 250

## 2023-08-17 MED ORDER — SOD CITRATE-CITRIC ACID 500-334 MG/5ML PO SOLN: 30.0000 mL | ORAL | Status: DC | PRN

## 2023-08-17 MED ORDER — THYROID 60 MG PO TABS
90.0000 mg | ORAL_TABLET | Freq: Every day | ORAL | Status: DC
  Administered 2023-08-17 – 2023-08-18 (×2): 90 mg via ORAL
  Filled 2023-08-17 (×2): qty 1

## 2023-08-17 MED ORDER — OXYTOCIN BOLUS FROM INFUSION
333.0000 mL | Freq: Once | INTRAVENOUS | Status: AC
  Administered 2023-08-18: 333 mL via INTRAVENOUS

## 2023-08-17 MED ORDER — MISOPROSTOL 25 MCG QUARTER TABLET
25.0000 ug | ORAL_TABLET | Freq: Once | ORAL | Status: AC
  Administered 2023-08-17: 25 ug via VAGINAL
  Filled 2023-08-17: qty 1

## 2023-08-17 MED ORDER — TERBUTALINE SULFATE 1 MG/ML IJ SOLN: 0.2500 mg | Freq: Once | INTRAMUSCULAR | Status: DC | PRN

## 2023-08-17 MED ORDER — LACTATED RINGERS IV SOLN: 500.0000 mL | Freq: Once | INTRAVENOUS | Status: DC

## 2023-08-17 MED ORDER — ACETAMINOPHEN 325 MG PO TABS
650.0000 mg | ORAL_TABLET | ORAL | Status: DC | PRN
  Administered 2023-08-17: 650 mg via ORAL
  Filled 2023-08-17: qty 2

## 2023-08-17 MED ORDER — PHENYLEPHRINE 80 MCG/ML (10ML) SYRINGE FOR IV PUSH (FOR BLOOD PRESSURE SUPPORT)
80.0000 ug | PREFILLED_SYRINGE | INTRAVENOUS | Status: DC | PRN
  Filled 2023-08-17: qty 10

## 2023-08-17 MED ORDER — LIDOCAINE HCL (PF) 1 % IJ SOLN
INTRAMUSCULAR | Status: DC | PRN
  Administered 2023-08-17 (×2): 4 mL via EPIDURAL

## 2023-08-17 MED ORDER — TERBUTALINE SULFATE 1 MG/ML IJ SOLN
0.2500 mg | Freq: Once | INTRAMUSCULAR | Status: AC | PRN
  Administered 2023-08-17: 0.25 mg via SUBCUTANEOUS
  Filled 2023-08-17: qty 1

## 2023-08-17 MED ORDER — EPHEDRINE 5 MG/ML INJ: 10.0000 mg | INTRAVENOUS | Status: DC | PRN

## 2023-08-17 MED ORDER — DIPHENHYDRAMINE HCL 50 MG/ML IJ SOLN: 12.5000 mg | INTRAMUSCULAR | Status: DC | PRN

## 2023-08-17 MED ORDER — FENTANYL CITRATE (PF) 100 MCG/2ML IJ SOLN: 50.0000 ug | INTRAMUSCULAR | Status: DC | PRN

## 2023-08-17 MED ORDER — LORATADINE 10 MG PO TABS
10.0000 mg | ORAL_TABLET | Freq: Every day | ORAL | Status: DC
Start: 2023-08-17 — End: 2023-08-18
  Administered 2023-08-17: 10 mg via ORAL
  Filled 2023-08-17 (×2): qty 1

## 2023-08-17 NOTE — Procedures (Signed)
 After informed verbal consent, patient signed consent form.  RN administered Terbutaline  0.25 mg SQ  Patient was placed in the supine position. ECV was attempted under Ultrasound guidance.  ECV was successfully performed.   FHR was reactive before and after the procedure.   Pt. Tolerated the procedure well. Binder placed   FHR was checked throughout the procedure and at the end of the procedure was 154 and reactive.   Suzen Maryan Masters, MD, MPH, ABFM, [facility] for [facility]

## 2023-08-17 NOTE — H&P (Signed)
 OBSTETRIC ADMISSION HISTORY AND PHYSICAL  Cheyenne Moore is a 33 y.o. female [phone] with IUP at [redacted]w[redacted]d by LMP presenting for IOL for CHTN and PG BMI 46. She reports +FMs, No LOF, no VB, no blurry vision, headaches or peripheral edema, and RUQ pain.  She plans on breast feeding. She request vasectomy with depo-provera bridge for birth control. She received her prenatal care at [facility]   Dating: By LMP --->  Estimated Date of Delivery: 08/22/23  Sono:   @[redacted]w[redacted]d , CWD, mild bilateral hydronephrosis,right renal pelvis 8 mm,left renal pelvis 9 mm otherwise normal female anatomy, cephalic presentation, fundal placental lie,  3610  grams, 77 % EFW   Prenatal History/Complications:  - CHTN not on Rx - Hypothyroidism; TSH 1.080 (6/12) on 90 mg Armour Thyroid  daily) - BMI 46 - Hx of GDM - Vitamin D  deficiency - mild bilateral fetal hydronephrosis,right renal pelvis 8 mm,left renal pelvis 9 mm    Past Medical History: Past Medical History:  Diagnosis Date   Allergic rhinitis    GERD (gastroesophageal reflux disease)    Gestational diabetes    metformin    History of gestational diabetes 07/26/2019   Hx A2DM>2hr pp GTT normal; early HgbA1c 5.3   Hypertension    Hypothyroidism    Neck pain 02/07/2019   Thyroid  disease    hypothyroid    Past Surgical History: Past Surgical History:  Procedure Laterality Date   NO PAST SURGERIES      Obstetrical History: OB History     Gravida  4   Para  2   Term  2   Preterm  0   AB  1   Living  2      SAB  1   IAB  0   Ectopic  0   Multiple  0   Live Births  2           Social History Social History   Socioeconomic History   Marital status: Married    Spouse name: Alm    Number of children: 2   Years of education: Not on file   Highest education level: Not on file  Occupational History   Not on file  Tobacco Use   Smoking status: Never   Smokeless tobacco: Never  Vaping Use   Vaping status: Never Used   Substance and Sexual Activity   Alcohol use: No   Drug use: No   Sexual activity: Yes  Other Topics Concern   Not on file  Social History Narrative   Not on file   Social Drivers of Health   Financial Resource Strain: Low Risk  (02/07/2023)   Overall Financial Resource Strain (CARDIA)    Difficulty of Paying Living Expenses: Not very hard  Food Insecurity: No Food Insecurity (02/07/2023)   Hunger Vital Sign    Worried About Running Out of Food in the Last Year: Never true    Ran Out of Food in the Last Year: Never true  Transportation Needs: No Transportation Needs (02/07/2023)   PRAPARE - Administrator, Civil Service (Medical): No    Lack of Transportation (Non-Medical): No  Physical Activity: Insufficiently Active (02/07/2023)   Exercise Vital Sign    Days of Exercise per Week: 2 days    Minutes of Exercise per Session: 30 min  Stress: No Stress Concern Present (02/07/2023)   [employer] of Occupational Health - Occupational Stress Questionnaire    Feeling of Stress : Not at all  Social Connections: Socially Integrated (02/07/2023)   Social Connection and Isolation Panel    Frequency of Communication with Friends and Family: More than three times a week    Frequency of Social Gatherings with Friends and Family: More than three times a week    Attends Religious Services: More than 4 times per year    Active Member of [employer] or Organizations: Yes    Attends Engineer, structural: More than 4 times per year    Marital Status: Married    Family History: Family History  Problem Relation Age of Onset   Diabetes Mother    Hypertension Mother    Kidney failure Mother    Diabetes Father    Hypertension Father    Heart failure Father    Cancer Maternal Grandmother        kidney   Colon cancer Maternal Grandmother    Hypertension Paternal Grandmother    Heart attack Paternal Grandmother    Cancer Paternal Grandfather        lung     Allergies: Allergies  Allergen Reactions   Red Dye #40 (Allura Red) Other (See Comments)    GI upset    Medications Prior to Admission  Medication Sig Dispense Refill Last Dose/Taking   acetaminophen  (TYLENOL ) 325 MG tablet Take 2 tablets (650 mg total) by mouth every 4 (four) hours as needed (for pain scale < 4).      aspirin  EC 81 MG tablet Take 2 tablets (162 mg total) by mouth daily. Swallow whole. 180 tablet 2    cholecalciferol  (VITAMIN D3) 25 MCG (1000 UNIT) tablet Take 1,000 Units by mouth daily.      ferrous sulfate  325 (65 FE) MG tablet Take 1 tablet (325 mg total) by mouth every other day. (Patient not taking: Reported on 08/10/2023) 15 tablet 0    loratadine  (CLARITIN ) 10 MG tablet Take 10 mg by mouth daily.      omeprazole  (PRILOSEC) 10 MG capsule Take 1 capsule (10 mg total) by mouth daily. 30 capsule 6    Prenatal Vit-Fe Fumarate-FA (PRENATAL VITAMIN PO) Take by mouth.      promethazine  (PHENERGAN ) 25 MG tablet Take 0.5-1 tablets (12.5-25 mg total) by mouth every 6 (six) hours as needed for nausea or vomiting. 30 tablet 6    thyroid  (ARMOUR THYROID ) 90 MG tablet Take 1 tablet (90 mg total) by mouth daily before breakfast. 30 tablet 11      Review of Systems   All systems reviewed and negative except as stated in HPI  Blood pressure (!) 153/87, pulse (!) 101, temperature 98 F (36.7 C), temperature source Oral, resp. rate 18, last menstrual period 11/15/2022, SpO2 97%, currently breastfeeding. General appearance: alert, cooperative, appears stated age, and no distress Lungs: clear to auscultation bilaterally Heart: regular rate and rhythm Abdomen: soft, non-tender; bowel sounds normal Pelvic: adequate, proven to 3690g Extremities: Homans sign is negative, no sign of DVT DTR's 2+ Presentation: cephalic Fetal monitoringBaseline: 125 bpm, Variability: Good {> 6 bpm), Accelerations: Reactive, and Decelerations: Absent Uterine activityFrequency: irritability   SVE:  1/50/-3  Prenatal labs: ABO, Rh: O/Positive/-- (01/21 1613) Antibody: Negative (05/13 0828) Rubella: 1.06 (01/21 1613) RPR: Non Reactive (05/13 0828)  HBsAg: Negative (01/21 1613)  HIV: Non Reactive (05/13 0828)  GBS: Negative/-- (07/11 1201)    Lab Results  Component Value Date   GBS Negative 07/28/2023   GTT wnl Genetic screening  negative, low risk female Anatomy US  mild bilateral hydronephrosis,right renal pelvis 8  mm,left renal pelvis 9 mm; otherwise normal female  Immunization History  Administered Date(s) Administered   Influenza, Seasonal, Injecte, Preservative Fre 02/07/2023   Influenza,inj,Quad PF,6+ Mos 10/21/2019, 11/12/2021   Influenza-Unspecified 10/18/2018   Tdap 07/24/2019, 11/12/2021, 06/29/2023    Prenatal Transfer Tool  Maternal Diabetes: No Genetic Screening: Normal Maternal Ultrasounds/Referrals: Fetal Kidney Anomalies Fetal Ultrasounds or other Referrals:  None Maternal Substance Abuse:  No Significant Maternal Medications:  Meds include: Syntroid Significant Maternal Lab Results: Group B Strep negative Number of Prenatal Visits:greater than 3 verified prenatal visits Maternal Vaccinations:TDap and Flu Other Comments:  None   No results found for this or any previous visit (from the past 24 hours).  Patient Active Problem List   Diagnosis Date Noted   Chronic hypertension affecting pregnancy 08/17/2023   Supervision of high-risk pregnancy 02/07/2023   History of gestational diabetes in prior pregnancy, currently pregnant 02/07/2023   Chronic hypertension 07/24/2019   Hypothyroid 02/07/2019   Vitamin D  deficiency 02/07/2019   Morbid obesity (HCC) 02/07/2019    Assessment/Plan:  NAVIE LAMOREAUX is a 33 y.o. H5E7987 at [redacted]w[redacted]d here for IOL for CHTN and PG BMI 46  #Labor:Cytotec  > FB > AROM > Pitocin  as indicated by SVE & Pt preference #Pain: Per pt request #FWB: Cat I #GBS status:  negative #Feeding: Breastmilk  #Reproductive Life  planning: Depo Provera and Vasectomy #Circ:  not applicable  #CHTN not on Rx Mild range, asymptomatic - CBC, BMP and Urine pro/cr pending  Mardy Shropshire, MD  08/17/2023, 12:45 AM

## 2023-08-17 NOTE — Progress Notes (Signed)
 Patient Vitals for the past 4 hrs:  BP Temp Temp src Pulse Resp SpO2  08/17/23 2232 120/78 (!) 97.4 F (36.3 C) Oral 83 16 --  08/17/23 2231 -- -- -- -- -- 100 %  08/17/23 2201 (!) 149/103 -- -- 92 -- --  08/17/23 2200 -- -- -- -- -- 100 %  08/17/23 2141 -- -- -- -- -- 99 %  08/17/23 2137 (!) 145/96 -- -- 87 -- --  08/17/23 2136 -- -- -- -- -- 100 %  08/17/23 2132 (!) 146/99 -- -- 88 -- --  08/17/23 2131 -- -- -- -- -- 100 %  08/17/23 2127 136/79 -- -- 83 -- --  08/17/23 2126 -- -- -- -- -- 98 %  08/17/23 2125 -- -- -- -- -- 98 %  08/17/23 2122 125/81 -- -- 89 -- --  08/17/23 2121 -- -- -- -- -- 99 %  08/17/23 2117 125/88 -- -- 91 -- --  08/17/23 2116 -- -- -- -- -- 98 %  08/17/23 2112 129/85 -- -- (!) 101 -- --  08/17/23 2111 -- -- -- -- -- 99 %  08/17/23 2109 (!) 135/90 -- -- 88 -- --  08/17/23 2106 -- -- -- -- -- 99 %  08/17/23 2104 (!) 147/101 -- -- 88 16 --  08/17/23 2101 -- -- -- -- -- 100 %  08/17/23 2020 (!) 145/87 -- -- 89 16 --  08/17/23 1913 -- -- -- -- 16 --   Comfortable w/epidural.  FHR Cat 1.  Not picking up any ctx on external monitor. IUPC placed, cx 5.5/80/-2. Pitocin  at 26 mu/min. MVUs ~ 180.  Progressing well.

## 2023-08-17 NOTE — Progress Notes (Signed)
 LABOR PROGRESS NOTE  Patient Name: Cheyenne Moore, female   DOB: [birth year], 33 y.o.  MRN: [id number]  FB out. Now cervix 5.5/80, however babe now breech with head maternal LUQ. Discussed possible options including ECV, pLTCS, breech vaginal delivery with recommendation for ECV. After discussion of R/B/A, she is amenable to ECV. Last ate at 0830. Will contact OR and anesthesia for planning. Plan to AROM ASAP if ECV successful.  Cat I. Normotensive to mild range pressures.  Almarie CHRISTELLA Moats, MD

## 2023-08-17 NOTE — Anesthesia Procedure Notes (Signed)
 Epidural Patient location during procedure: OB Start time: 08/17/2023 8:57 PM End time: 08/17/2023 9:02 PM  Staffing Anesthesiologist: Peggye Delon Brunswick, MD Performed: anesthesiologist   Preanesthetic Checklist Completed: patient identified, IV checked, site marked, risks and benefits discussed, surgical consent, monitors and equipment checked, pre-op evaluation and timeout performed  Epidural Patient position: sitting Prep: DuraPrep and site prepped and draped Patient monitoring: continuous pulse ox and blood pressure Approach: midline Location: L3-L4 Injection technique: LOR saline  Needle:  Needle type: Tuohy  Needle gauge: 17 G Needle length: 9 cm and 9 Needle insertion depth: 8 cm Catheter type: closed end flexible Catheter size: 19 Gauge Catheter at skin depth: 13 cm Test dose: negative  Assessment Events: blood not aspirated, no cerebrospinal fluid, injection not painful, no injection resistance, no paresthesia and negative IV test  Additional Notes The patient has requested an epidural for labor pain management. Risks and benefits including, but not limited to, infection, bleeding, local anesthetic toxicity, headache, hypotension, back pain, block failure, etc. were discussed with the patient. The patient expressed understanding and consented to the procedure. I confirmed that the patient has no bleeding disorders and is not taking blood thinners. I confirmed the patient's last platelet count with the nurse. A time-out was performed immediately prior to the procedure. Please see nursing documentation for vital signs. Sterile technique was used throughout the whole procedure. Once LOR achieved, the epidural catheter threaded easily without resistance. Aspiration of the catheter was negative for blood and CSF. The epidural was dosed slowly and an infusion was started.  1 attempt(s)Reason for block:procedure for pain

## 2023-08-17 NOTE — Progress Notes (Signed)
 BPs mildly elevated.  Not having painful ctx.  Cx 5/60/-2, vtx well applied to cx and no compound appendage palpable. AROM w/light mec.  Pitocin  at 14mu/min. Hopeful AROM will increase ctx, but will increase pitocin  until labor adequate.  FHR Cat 1

## 2023-08-17 NOTE — Progress Notes (Signed)
 LABOR PROGRESS NOTE  Patient Name: Cheyenne Moore, female   DOB: 26-Mar-1990, 33 y.o.  MRN: [id number]  Pt coping well, 3/10 cramping. Discussed role, risks, benefits of FB; pt agreeable.   Blood pressure (!) 149/89, pulse 91, temperature 98.3 F (36.8 C), temperature source Oral, resp. rate 18, height 5' 6 (1.676 m), weight (!) 145.8 kg, last menstrual period 11/15/2022, SpO2 97%, currently breastfeeding.  Dilation: 2.5 Effacement (%): 60 Station: -3 Presentation: Vertex Exam by:: Dr Loyola  EFM: baseline 125, accels, no decels, moderate variability TOCO: 1-6.74min contractions  FB placed Cat I Ctx every few minutes will leave FB alone If ctx space greater than 5 mins could add Pitocin  Reassess for AROM when FB comes out  Mardy Loyola, MD

## 2023-08-17 NOTE — Progress Notes (Signed)
 LABOR PROGRESS NOTE  Patient Name: Cheyenne Moore, female   DOB: [birth year], 33 y.o.  MRN: [id number]  To bedside to reassess patient. Cervix still incredibly posterior and babe ballotable. Did ultrasound to confirm vertex still. Is vertex however hand next to head on maternal right. Patient will walk/be in upright positions to promote descent. Continue to titrate pit. Cat I.  Cheyenne CHRISTELLA Moats, MD

## 2023-08-17 NOTE — Anesthesia Preprocedure Evaluation (Signed)
 Anesthesia Evaluation  Patient identified by MRN, date of birth, ID band Patient awake    Reviewed: Allergy & Precautions, NPO status , Patient's Chart, lab work & pertinent test results  History of Anesthesia Complications Negative for: history of anesthetic complications  Airway Mallampati: III  TM Distance: >3 FB Neck ROM: Full    Dental   Pulmonary neg pulmonary ROS   Pulmonary exam normal breath sounds clear to auscultation       Cardiovascular hypertension,  Rhythm:Regular Rate:Normal     Neuro/Psych negative neurological ROS     GI/Hepatic Neg liver ROS,GERD  Medicated,,  Endo/Other  Hypothyroidism  Class 4 obesity  Renal/GU negative Renal ROS     Musculoskeletal   Abdominal  (+) + obese  Peds  Hematology negative hematology ROS (+) Lab Results      Component                Value               Date                      WBC                      10.1                08/17/2023                HGB                      12.0                08/17/2023                HCT                      34.3 (L)            08/17/2023                MCV                      87.9                08/17/2023                PLT                      194                 08/17/2023              Anesthesia Other Findings   Reproductive/Obstetrics (+) Pregnancy                              Anesthesia Physical Anesthesia Plan  ASA: 3  Anesthesia Plan: Epidural   Post-op Pain Management:    Induction:   PONV Risk Score and Plan:   Airway Management Planned: Natural Airway  Additional Equipment:   Intra-op Plan:   Post-operative Plan:   Informed Consent: I have reviewed the patients History and Physical, chart, labs and discussed the procedure including the risks, benefits and alternatives for the proposed anesthesia with the patient or authorized representative who has indicated his/her  understanding and acceptance.       Plan Discussed with: Anesthesiologist  Anesthesia Plan  Comments: (I have discussed risks of neuraxial anesthesia including but not limited to infection, bleeding, nerve injury, back pain, headache, seizures, and failure of block. Patient denies bleeding disorders and is not currently anticoagulated. Labs have been reviewed. Risks and benefits discussed. All patient's questions answered.  )         Anesthesia Quick Evaluation

## 2023-08-18 ENCOUNTER — Encounter (HOSPITAL_COMMUNITY): Payer: Self-pay | Admitting: Obstetrics & Gynecology

## 2023-08-18 DIAGNOSIS — O99284 Endocrine, nutritional and metabolic diseases complicating childbirth: Secondary | ICD-10-CM | POA: Diagnosis not present

## 2023-08-18 DIAGNOSIS — Z3A39 39 weeks gestation of pregnancy: Secondary | ICD-10-CM

## 2023-08-18 DIAGNOSIS — O1002 Pre-existing essential hypertension complicating childbirth: Secondary | ICD-10-CM | POA: Diagnosis not present

## 2023-08-18 DIAGNOSIS — O99214 Obesity complicating childbirth: Secondary | ICD-10-CM | POA: Diagnosis not present

## 2023-08-18 MED ORDER — TRANEXAMIC ACID-NACL 1000-0.7 MG/100ML-% IV SOLN
1000.0000 mg | INTRAVENOUS | Status: AC
  Administered 2023-08-18: 1000 mg via INTRAVENOUS
  Filled 2023-08-18: qty 100

## 2023-08-18 MED ORDER — IBUPROFEN 600 MG PO TABS
600.0000 mg | ORAL_TABLET | Freq: Four times a day (QID) | ORAL | Status: DC
  Administered 2023-08-18 – 2023-08-19 (×5): 600 mg via ORAL
  Filled 2023-08-18 (×5): qty 1

## 2023-08-18 MED ORDER — COCONUT OIL OIL: 1.0000 | TOPICAL_OIL | Status: DC | PRN

## 2023-08-18 MED ORDER — METHYLERGONOVINE MALEATE 0.2 MG/ML IJ SOLN
0.2000 mg | INTRAMUSCULAR | Status: DC | PRN
  Administered 2023-08-18: 0.2 mg via INTRAMUSCULAR
  Filled 2023-08-18: qty 1

## 2023-08-18 MED ORDER — TRANEXAMIC ACID-NACL 1000-0.7 MG/100ML-% IV SOLN: 1000.0000 mg | Freq: Once | INTRAVENOUS | Status: DC | PRN

## 2023-08-18 MED ORDER — POLYETHYLENE GLYCOL 3350 17 G PO PACK
17.0000 g | PACK | Freq: Every day | ORAL | Status: DC
  Administered 2023-08-18 – 2023-08-19 (×2): 17 g via ORAL
  Filled 2023-08-18 (×2): qty 1

## 2023-08-18 MED ORDER — FUROSEMIDE 20 MG PO TABS
10.0000 mg | ORAL_TABLET | Freq: Every day | ORAL | Status: DC
  Administered 2023-08-19: 10 mg via ORAL
  Filled 2023-08-18: qty 1

## 2023-08-18 MED ORDER — BENZOCAINE-MENTHOL 20-0.5 % EX AERO
1.0000 | INHALATION_SPRAY | CUTANEOUS | Status: DC | PRN
Start: 2023-08-18 — End: 2023-08-19

## 2023-08-18 MED ORDER — COMPLETENATE 29-1 MG PO CHEW
1.0000 | CHEWABLE_TABLET | Freq: Every day | ORAL | Status: DC
  Administered 2023-08-18 – 2023-08-19 (×2): 1 via ORAL
  Filled 2023-08-18 (×2): qty 1

## 2023-08-18 MED ORDER — LACTATED RINGERS IV SOLN: INTRAVENOUS | Status: DC

## 2023-08-18 MED ORDER — DIBUCAINE (PERIANAL) 1 % EX OINT: 1.0000 | TOPICAL_OINTMENT | CUTANEOUS | Status: DC | PRN

## 2023-08-18 MED ORDER — SIMETHICONE 80 MG PO CHEW: 80.0000 mg | CHEWABLE_TABLET | ORAL | Status: DC | PRN

## 2023-08-18 MED ORDER — ONDANSETRON HCL 4 MG/2ML IJ SOLN: 4.0000 mg | INTRAMUSCULAR | Status: DC | PRN

## 2023-08-18 MED ORDER — WITCH HAZEL-GLYCERIN EX PADS: 1.0000 | MEDICATED_PAD | CUTANEOUS | Status: DC | PRN

## 2023-08-18 MED ORDER — ONDANSETRON HCL 4 MG PO TABS: 4.0000 mg | ORAL_TABLET | ORAL | Status: DC | PRN

## 2023-08-18 MED ORDER — OXYCODONE HCL 5 MG PO TABS: 5.0000 mg | ORAL_TABLET | ORAL | Status: DC | PRN

## 2023-08-18 MED ORDER — OXYCODONE HCL 5 MG PO TABS: 10.0000 mg | ORAL_TABLET | ORAL | Status: DC | PRN

## 2023-08-18 MED ORDER — ACETAMINOPHEN 325 MG PO TABS
650.0000 mg | ORAL_TABLET | ORAL | Status: DC | PRN
  Administered 2023-08-18 – 2023-08-19 (×4): 650 mg via ORAL
  Filled 2023-08-18 (×4): qty 2

## 2023-08-18 MED ORDER — CALCIUM CARBONATE ANTACID 500 MG PO CHEW
2.0000 | CHEWABLE_TABLET | Freq: Once | ORAL | Status: AC
  Administered 2023-08-18: 400 mg via ORAL
  Filled 2023-08-18: qty 2

## 2023-08-18 MED ORDER — POTASSIUM CHLORIDE CRYS ER 20 MEQ PO TBCR
10.0000 meq | EXTENDED_RELEASE_TABLET | Freq: Every day | ORAL | Status: DC
  Administered 2023-08-19: 10 meq via ORAL
  Filled 2023-08-18: qty 1

## 2023-08-18 NOTE — Discharge Summary (Addendum)
 Postpartum Discharge Summary     Patient Name: Cheyenne Moore DOB: 1990-09-12 MRN: [id number]  Date of admission: 08/17/2023 Delivery date:08/18/2023 Delivering provider: VANNIE MATAR R Date of discharge: 08/19/2023  Admitting diagnosis: Chronic hypertension affecting pregnancy [O10.919] Intrauterine pregnancy: [redacted]w[redacted]d     Secondary diagnosis:  Principal Problem:   Chronic hypertension affecting pregnancy Active Problems:   Hypothyroid   Morbid obesity (HCC)   Supervision of high-risk pregnancy   Malpresentation before onset of labor   Vertex presentation of fetus in third trimester  Additional problems: None    Discharge diagnosis: Term Pregnancy Delivered and CHTN                                              Post partum procedures: None Augmentation: AROM, Pitocin , and IP Foley Complications: None  Hospital course: Induction of Labor With Vaginal Delivery   33 y.o. yo [phone] at [redacted]w[redacted]d was admitted to the hospital 08/17/2023 for induction of labor.  Indication for induction: cHTN (no meds).  Patient had an labor course complicated by unstable lie with ECV in labor. Membrane Rupture Time/Date: 7:08 PM,08/17/2023  Delivery Method:Vaginal, Spontaneous Operative Delivery:N/A Episiotomy: None Lacerations:  None Details of delivery can be found in separate delivery note.  Patient had an uncomplicated postpartum course complicated. Patient is discharged home 08/19/23.  Newborn Data: Birth date:08/18/2023 Birth time:11:25 AM Gender:Female Living status:Living Apgars:8 ,9  Weight:3660 g  Magnesium  Sulfate received: No BMZ received: No Rhophylac:N/A MMR:N/A T-DaP:Given prenatally Flu: Yes RSV Vaccine received: No Transfusion:No  Immunizations received: Immunization History  Administered Date(s) Administered   Influenza, Seasonal, Injecte, Preservative Fre 02/07/2023   Influenza,inj,Quad PF,6+ Mos 10/21/2019, 11/12/2021   Influenza-Unspecified 10/18/2018   Tdap  07/24/2019, 11/12/2021, 06/29/2023    Physical exam  Vitals:   08/18/23 1800 08/19/23 0018 08/19/23 0516 08/19/23 1428  BP: 123/71 122/86 110/77 118/80  Pulse: (!) 109 92 81 90  Resp: 18 18 18 18   Temp: 98 F (36.7 C) 98.6 F (37 C) 98.2 F (36.8 C) 97.8 F (36.6 C)  TempSrc: Oral Oral Oral Oral  SpO2: 99% 99% 100% 100%  Weight:      Height:       General: alert and cooperative Lochia: appropriate Uterine Fundus: firm Incision: N/A DVT Evaluation: Negative Homan's sign. No cords or calf tenderness. No significant calf/ankle edema. Labs: Lab Results  Component Value Date   WBC 11.4 (H) 08/19/2023   HGB 8.7 (L) 08/19/2023   HCT 25.4 (L) 08/19/2023   MCV 90.1 08/19/2023   PLT 189 08/19/2023      Latest Ref Rng & Units 08/17/2023   12:42 AM  CMP  Glucose 70 - 99 mg/dL 96   BUN 6 - 20 mg/dL 6   Creatinine 9.55 - 8.99 mg/dL 9.66   Sodium 864 - 854 mmol/L 137   Potassium 3.5 - 5.1 mmol/L 3.7   Chloride 98 - 111 mmol/L 112   CO2 22 - 32 mmol/L 20   Calcium  8.9 - 10.3 mg/dL 9.4    Edinburgh Score:    08/19/2023    8:02 AM  Edinburgh Postnatal Depression Scale Screening Tool  I have been able to laugh and see the funny side of things. 0  I have looked forward with enjoyment to things. 0  I have blamed myself unnecessarily when things went wrong. 0  I  have been anxious or worried for no good reason. 0  I have felt scared or panicky for no good reason. 1  Things have been getting on top of me. 1  I have been so unhappy that I have had difficulty sleeping. 0  I have felt sad or miserable. 1  I have been so unhappy that I have been crying. 1  The thought of harming myself has occurred to me. 0  Edinburgh Postnatal Depression Scale Total 4   Edinburgh Postnatal Depression Scale Total: 4   After visit meds:  Allergies as of 08/19/2023       Reactions   Red Dye #40 (allura Red) Other (See Comments)   GI upset        Medication List     STOP taking these  medications    aspirin  EC 81 MG tablet       TAKE these medications    acetaminophen  325 MG tablet Commonly known as: Tylenol  Take 2 tablets (650 mg total) by mouth every 4 (four) hours as needed (for pain scale < 4).   cholecalciferol  25 MCG (1000 UNIT) tablet Commonly known as: VITAMIN D3 Take 1,000 Units by mouth daily.   FeroSul 325 (65 Fe) MG tablet Generic drug: ferrous sulfate  Take 1 tablet (325 mg total) by mouth every other day. What changed: Another medication with the same name was added. Make sure you understand how and when to take each.   FeroSul 325 (65 Fe) MG tablet Generic drug: ferrous sulfate  Take 1 tablet (325 mg total) by mouth every other day. What changed: You were already taking a medication with the same name, and this prescription was added. Make sure you understand how and when to take each.   furosemide  20 MG tablet Commonly known as: LASIX  Take 0.5 tablet (10 mg total) by mouth daily. Start taking on: August 20, 2023   ibuprofen  600 MG tablet Commonly known as: ADVIL  Take 1 tablet (600 mg total) by mouth every 6 (six) hours.   Incassia  0.35 MG tablet Generic drug: norethindrone  Take 1 tablet (0.35 mg total) by mouth daily.   loratadine  10 MG tablet Commonly known as: CLARITIN  Take 10 mg by mouth daily.   omeprazole  10 MG capsule Commonly known as: PRILOSEC Take 1 capsule (10 mg total) by mouth daily.   polyethylene glycol powder 17 GM/SCOOP powder Commonly known as: GLYCOLAX /MIRALAX  Dissolve 1 capful (17 g) in liquid and take by mouth daily. Start taking on: August 20, 2023   potassium chloride  10 MEQ tablet Commonly known as: KLOR-CON  M Take 1 tablet (10 mEq total) by mouth daily. Start taking on: August 20, 2023   PRENATAL VITAMIN PO Take by mouth.   promethazine  25 MG tablet Commonly known as: PHENERGAN  Take 0.5-1 tablets (12.5-25 mg total) by mouth every 6 (six) hours as needed for nausea or vomiting.   thyroid  90 MG  tablet Commonly known as: Armour Thyroid  Take 1 tablet (90 mg total) by mouth daily before breakfast.      Discharge home in stable condition Infant Feeding: Breast Infant Disposition:home with mother Discharge instruction: per After Visit Summary and Postpartum booklet. Activity: Advance as tolerated. Pelvic rest for 6 weeks.  Diet: routine diet Future Appointments:No future appointments.  Follow up Visit: Message sent to [facility] by JINNY Finder on 08/19/23  Please schedule this patient for a In person postpartum visit in 4 weeks with the following provider: APP. Additional Postpartum F/U:BP check 1 week  High risk pregnancy  complicated by: HTN Delivery mode:  Vaginal, Spontaneous Anticipated Birth Control:  POPs  Vernell DELENA School, MD  Evaluation and management procedures were performed by the [facility] Medicine Resident under my supervision. I was immediately available for direct supervision, assistance and direction throughout this encounter.  I also confirm that I have verified the information documented in the resident's note, and that I have also personally reperformed the pertinent components of the physical exam and all of the medical decision making activities.  I have also made any necessary editorial changes.   Augustin Slade, MD Family Medicine - Obstetrics Fellow    08/19/2023 Augustin JAYSON Slade, MD

## 2023-08-18 NOTE — Progress Notes (Signed)
 Patient ID: Cheyenne Moore, female   DOB: August 29, 1990, 33 y.o.   MRN: [id number]  BP 139/86   Pulse (!) 103   Temp 98.3 F (36.8 C) (Oral)   Resp 16   Ht 5' 6 (1.676 m)   Wt (!) 145.8 kg   LMP 11/15/2022 (Exact Date)   SpO2 97%   BMI 51.89 kg/m   Dilation: 9 Effacement (%): 90 Cervical Position: Posterior Station: 0 Presentation: Vertex Exam by:: J.Cox, RN  Patient resting on right side with epidural in place. UC q 2.5-3 min apart. FHR Cat 2; occasional variables, reassured by moderate variability and 15x15 accels. RN plans to recheck cervix shortly.  Vernell Ruddle, SNM 08/18/23 1008

## 2023-08-18 NOTE — Progress Notes (Signed)
 Patient Vitals for the past 4 hrs:  BP Temp Temp src Pulse Resp SpO2  08/18/23 0436 (!) 87/48 -- -- 87 -- 98 %  08/18/23 0405 (!) 90/49 97.8 F (36.6 C) Oral 84 15 98 %  08/18/23 0332 115/71 -- -- 91 16 --  08/18/23 0331 -- -- -- -- -- 98 %  08/18/23 0302 120/72 -- -- 86 16 --  08/18/23 0301 -- -- -- -- -- 98 %  08/18/23 0232 110/67 -- -- 83 -- --  08/18/23 0231 -- -- -- -- -- 98 %  08/18/23 0202 117/74 -- -- 92 16 --  08/18/23 0201 -- -- -- -- -- 99 %  08/18/23 0141 -- (!) 97.5 F (36.4 C) Oral -- -- --  08/18/23 0132 (!) 112/53 -- -- 89 16 --  08/18/23 0131 -- -- -- -- -- 98 %  08/18/23 0102 (!) 119/58 -- -- 81 16 --  08/18/23 0101 -- -- -- -- -- 99 %   Pt reached 30 mu/min pitocin  with labor still inadequate and MVUs decreasing around 0130. Pitocin  turned off for 1.5 hours, restarted at 14 m u/min.  Hopeful to reach adequate labor this time around.  Maximum cx dilation 6.5/80/-2.  FHR remains Cat 1

## 2023-08-18 NOTE — Anesthesia Postprocedure Evaluation (Signed)
 Anesthesia Post Note  Patient: CRISTEL RAIL  Procedure(s) Performed: AN AD HOC LABOR EPIDURAL     Patient location during evaluation: Mother Baby Anesthesia Type: Epidural Level of consciousness: awake and alert Pain management: pain level controlled Vital Signs Assessment: post-procedure vital signs reviewed and stable Respiratory status: spontaneous breathing, nonlabored ventilation and respiratory function stable Cardiovascular status: stable Postop Assessment: no headache, no backache and epidural receding Anesthetic complications: no   No notable events documented.  Last Vitals:  Vitals:   08/18/23 1430 08/18/23 1800  BP: 101/65 123/71  Pulse: (!) 105 (!) 109  Resp: 18 18  Temp: 37.2 C 36.7 C  SpO2:  99%    Last Pain:  Vitals:   08/18/23 1800  TempSrc: Oral  PainSc:    Pain Goal:                   Jayron Maqueda

## 2023-08-19 ENCOUNTER — Other Ambulatory Visit (HOSPITAL_COMMUNITY): Payer: Self-pay

## 2023-08-19 LAB — CBC
HCT: 25.4 % — ABNORMAL LOW (ref 36.0–46.0)
Hemoglobin: 8.7 g/dL — ABNORMAL LOW (ref 12.0–15.0)
MCH: 30.9 pg (ref 26.0–34.0)
MCHC: 34.3 g/dL (ref 30.0–36.0)
MCV: 90.1 fL (ref 80.0–100.0)
Platelets: 189 K/uL (ref 150–400)
RBC: 2.82 MIL/uL — ABNORMAL LOW (ref 3.87–5.11)
RDW: 14.1 % (ref 11.5–15.5)
WBC: 11.4 K/uL — ABNORMAL HIGH (ref 4.0–10.5)
nRBC: 0 % (ref 0.0–0.2)

## 2023-08-19 MED ORDER — POLYETHYLENE GLYCOL 3350 17 GM/SCOOP PO POWD
17.0000 g | Freq: Every day | ORAL | 0 refills | Status: DC
  Filled 2023-08-19: qty 238, 14d supply, fill #0

## 2023-08-19 MED ORDER — FUROSEMIDE 20 MG PO TABS
10.0000 mg | ORAL_TABLET | Freq: Every day | ORAL | 0 refills | Status: DC
  Filled 2023-08-19: qty 5, 10d supply, fill #0

## 2023-08-19 MED ORDER — NORETHINDRONE 0.35 MG PO TABS
1.0000 | ORAL_TABLET | Freq: Every day | ORAL | 11 refills | Status: DC
  Filled 2023-08-19: qty 28, 28d supply, fill #0

## 2023-08-19 MED ORDER — FERROUS SULFATE 325 (65 FE) MG PO TABS
325.0000 mg | ORAL_TABLET | ORAL | 0 refills | Status: AC
  Filled 2023-08-19: qty 60, 120d supply, fill #0

## 2023-08-19 MED ORDER — SLYND 4 MG PO TABS
1.0000 | ORAL_TABLET | Freq: Every day | ORAL | 4 refills | Status: DC
  Filled 2023-08-19: qty 28, 28d supply, fill #0

## 2023-08-19 MED ORDER — POTASSIUM CHLORIDE CRYS ER 10 MEQ PO TBCR
10.0000 meq | EXTENDED_RELEASE_TABLET | Freq: Every day | ORAL | 0 refills | Status: DC
  Filled 2023-08-19: qty 5, 5d supply, fill #0

## 2023-08-19 MED ORDER — IBUPROFEN 600 MG PO TABS
600.0000 mg | ORAL_TABLET | Freq: Four times a day (QID) | ORAL | 0 refills | Status: AC
  Filled 2023-08-19: qty 30, 8d supply, fill #0

## 2023-08-19 MED ORDER — THYROID 60 MG PO TABS
90.0000 mg | ORAL_TABLET | Freq: Every day | ORAL | Status: DC
  Administered 2023-08-19: 90 mg via ORAL
  Filled 2023-08-19 (×2): qty 1

## 2023-08-19 MED ORDER — FERROUS SULFATE 325 (65 FE) MG PO TABS
325.0000 mg | ORAL_TABLET | ORAL | Status: DC
  Administered 2023-08-19: 325 mg via ORAL
  Filled 2023-08-19: qty 1

## 2023-08-19 NOTE — Lactation Note (Signed)
 This note was copied from a baby's chart. Lactation Consultation Note  Patient Name: Cheyenne Moore Date: 08/19/2023 Age:31 hours Reason for consult: Initial assessment;Term Experienced BF mom stated BF going well. Mom has no questions or concerns. Mom stated baby has a good latch , denies painful latch. Mom encouraged to feed baby 8-12 times/24 hours and with feeding cues.  Reminded mom of BF effects, cramping, thirsty, sleepiness. Mom stated she is having those effects. Encouraged mom to call for assistance as needed.  Maternal Data Does the patient have breastfeeding experience prior to this delivery?: Yes How long did the patient breastfeed?: 1st child now 53 yrs old for 16 months, 2nd child now 19 months for 1 yr.  Feeding    LATCH Score Latch: Grasps breast easily, tongue down, lips flanged, rhythmical sucking. (LC didn't see latch.)     Type of Nipple: Everted at rest and after stimulation  Comfort (Breast/Nipple): Soft / non-tender  Hold (Positioning): No assistance needed to correctly position infant at breast.      Lactation Tools Discussed/Used    Interventions Interventions: Breast feeding basics reviewed;LC Services brochure;Education;CDC milk storage guidelines  Discharge    Consult Status Consult Status: PRN    Oseph Imburgia G 08/19/2023, 2:25 AM

## 2023-08-19 NOTE — Progress Notes (Addendum)
 POSTPARTUM PROGRESS NOTE  Post Partum Day 1  Subjective:  Cheyenne Moore is a 33 y.o. [id number] s/p SVD over IP at [redacted]w[redacted]d.  She reports she is doing well. No acute events overnight. She denies any problems with ambulating, voiding or po intake. Denies nausea or vomiting.  Pain is well controlled.  Lochia is less than a period.  Objective: Blood pressure 110/77, pulse 81, temperature 98.2 F (36.8 C), temperature source Oral, resp. rate 18, height 5' 6 (1.676 m), weight (!) 145.8 kg, last menstrual period 11/15/2022, SpO2 100%, unknown if currently breastfeeding.  Physical Exam:  General: alert, cooperative and no distress Chest: no respiratory distress Heart:regular rate, distal pulses intact Uterine Fundus: firm, appropriately tender DVT Evaluation: No calf swelling or tenderness Extremities: Mild non-pitting edema to the mid-calf Skin: warm, dry  Recent Labs    08/17/23 1123 08/19/23 0503  HGB 12.0 8.7*  HCT 34.3* 25.4*    Assessment/Plan: Cheyenne Moore is a 33 y.o. [phone] s/p SVD over IP at [redacted]w[redacted]d   PPD#1 - Doing well  Routine postpartum care  cHTN: - No medications during pregnancy - Normotensive since delivery - Continue on 5 days of lasix  and KCl   Contraception: POP bridge to vasectomy Feeding: Breast Dispo: Plan for discharge today.   LOS: 2 days   Vernell DELENA School, MD OB Fellow  08/19/2023, 1:00 PM   Evaluation and management procedures were performed by the [facility] Medicine Resident under my supervision. I was immediately available for direct supervision, assistance and direction throughout this encounter.  I also confirm that I have verified the information documented in the resident's note, and that I have also personally reperformed the pertinent components of the physical exam and all of the medical decision making activities.  I have also made any necessary editorial changes.   Augustin Slade, MD Family Medicine - Obstetrics Fellow

## 2023-08-20 LAB — BIRTH TISSUE RECOVERY COLLECTION (PLACENTA DONATION)

## 2023-08-20 NOTE — Lactation Note (Signed)
 This note was copied from a baby's chart. Lactation Consultation Note  Patient Name: Cheyenne Moore Unijb'd Date: 08/20/2023 Age:31 hours Reason for consult: Follow-up assessment;Term;Nipple pain/trauma  P2- MOB reports that infant has been cluster feeding, latching well overall, but infant's tongue tie (caught by the midwife per MOB) has caused some discomfort and a compression line on MOB's nipple. MOB unlatched infant from her breast to show LC that tie. MOB has been provided with the physician resource. MOB then placed infant back on the left breast in the football hold. Infant was eager and latched immediately with flanged lips. Infant had a strong rhythmic suck and needed no stimulation to sustain rhythmic. MOB denies needing any supplies and denies having any concerns. MOB has a manual pump in her room as well as coconut oil for her bruised nipple. LC encouraged MOB to call for further assistance as needed. LC encouraged MOB to follow up with the outpatient LC team to continue working on feedings post tie release. (MOB reports planning to release the tongue tie). Maternal Data Does the patient have breastfeeding experience prior to this delivery?: Yes  Feeding Mother's Current Feeding Choice: Breast Milk  LATCH Score Latch: Grasps breast easily, tongue down, lips flanged, rhythmical sucking.  Audible Swallowing: A few with stimulation  Type of Nipple: Everted at rest and after stimulation  Comfort (Breast/Nipple): Filling, red/small blisters or bruises, mild/mod discomfort  Hold (Positioning): No assistance needed to correctly position infant at breast.  LATCH Score: 8   Lactation Tools Discussed/Used Tools: Pump Breast pump type: Manual Pump Education: Setup, frequency, and cleaning;Milk Storage Reason for Pumping: MOB request Pumping frequency: 15-20 min every 3 hrs  Interventions Interventions: Breast feeding basics reviewed;Education;LC Services  brochure  Discharge Discharge Education: Engorgement and breast care;Warning signs for feeding baby  Consult Status Consult Status: PRN    Recardo Hoit BS, IBCLC 08/20/2023, 12:00 AM

## 2023-08-20 NOTE — Lactation Note (Signed)
 This note was copied from a baby's chart. Lactation Consultation Note  Patient Name: Cheyenne Moore Unijb'd Date: 08/20/2023 Age:31 hours Reason for consult: Follow-up assessment;Maternal discharge;Term  P2, 39 wks, @ 52 hrs of life. Per mom baby is feeding well in spit of tongue tie. Discussed keeping milk moving with baby and pump as needed. Coconut oil provided. Encouraged mom to keep working on big mouth latch with baby and use EBM or coconut oil after each feed. Discussed cluster feeding overnight/ early morning brings in our milk supply, shared expectations of milk coming in. Highlighted risk of engorgement. Discussed hand pump/express to soften breasts, motrin  as anti-inflammatory, and ice packs for 10-20 minutes post feed/pumping if still over-full is the best treatments for inflamed/engorged breasts.  Maternal Data Has patient been taught Hand Expression?: Yes Does the patient have breastfeeding experience prior to this delivery?: Yes  Feeding Mother's Current Feeding Choice: Breast Milk  Lactation Tools Discussed/Used Pump Education: Milk Storage  Interventions Interventions: Breast feeding basics reviewed;Expressed milk;Coconut oil;Hand pump;Education;LC Services brochure;CDC milk storage guidelines  Discharge Discharge Education: Engorgement and breast care Pump: DEBP;Manual;Personal (Per mom has ordered a Medela pump, and provided a medela hand pump here, encouraged mom to keep all parts, interchangable)  Consult Status Consult Status: Complete    Jonee Lamore 08/20/2023, 4:05 PM

## 2023-08-21 ENCOUNTER — Other Ambulatory Visit

## 2023-08-21 ENCOUNTER — Ambulatory Visit: Payer: Self-pay | Admitting: Obstetrics & Gynecology

## 2023-09-02 ENCOUNTER — Telehealth (HOSPITAL_COMMUNITY): Payer: Self-pay | Admitting: *Deleted

## 2023-09-02 NOTE — Telephone Encounter (Signed)
 09/02/2023  Name: Cheyenne Moore MRN: [id number] DOB: 1990-09-15  Reason for Call:  Transition of Care Hospital Discharge Call  Contact Status: Patient Contact Status: Complete  Language assistant needed:          Follow-Up Questions: Do You Have Any Concerns About Your Health As You Heal From Delivery?: No Do You Have Any Concerns About Your Infants Health?: No  Edinburgh Postnatal Depression Scale:  In the Past 7 Days:   Patient stated, I did one of those at my [facility] appointment just the other day. Patient unsure of score, but no areas of concern identified per her report. EPDS not completed at this time. PHQ2-9 Depression Scale:     Discharge Follow-up: Edinburgh score requires follow up?: N/A Patient was advised of the following resources:: Breastfeeding Support Group, Support Group Requested email information - sent by RN. Post-discharge interventions: Reviewed Newborn Safe Sleep Practices  Signature [name], RN, 09/02/23, [phone]

## 2023-09-03 ENCOUNTER — Encounter: Payer: Self-pay | Admitting: Obstetrics and Gynecology

## 2023-09-21 ENCOUNTER — Encounter: Payer: Self-pay | Admitting: Advanced Practice Midwife

## 2023-09-21 ENCOUNTER — Ambulatory Visit (INDEPENDENT_AMBULATORY_CARE_PROVIDER_SITE_OTHER): Admitting: Advanced Practice Midwife

## 2023-09-21 NOTE — Progress Notes (Signed)
 Post Partum Visit Note   Chief Complaint:   Postpartum Care  History of Present Illness:   Cheyenne Moore is a 33 y.o. [phone] Caucasian female being seen today for a postpartum visit. She is 5 weeks postpartum following a spontaneous vaginal delivery at 39.3 gestational weeks. IOL: Yes, for Unstable lie and CHTN. Anesthesia: epidural.  Laceration: none.  Complications: none. Inpatient contraception: no.   Pregnancy complicated by [facility]. Was not on meds prior to pregnancy Tobacco use: no. Substance use disorder: no. Last pap smear:     Component Value Date/Time   DIAGPAP  02/02/2021 1456    - Negative for intraepithelial lesion or malignancy (NILM)   DIAGPAP  10/31/2017 0000    NEGATIVE FOR INTRAEPITHELIAL LESIONS OR MALIGNANCY.   HPVHIGH Negative 02/02/2021 1456   ADEQPAP  02/02/2021 1456    Satisfactory for evaluation; transformation zone component PRESENT.   ADEQPAP  10/31/2017 0000    Satisfactory for evaluation  endocervical/transformation zone component PRESENT.     Next pap smear due: 2026 Patient's last menstrual period was 11/15/2022 (exact date).  Postpartum course has been uncomplicated. Bleeding staining only. Starte POPs  Bowel function is normal. Bladder function is normal. Urinary incontinence? No, fecal incontinence? No Patient is not sexually active. Last sexual activity: prior to birth.    Upstream - 09/21/23 0956       Pregnancy Intention Screening   Does the patient want to become pregnant in the next year? No    Does the patient's partner want to become pregnant in the next year? No    Would the patient like to discuss contraceptive options today? No      Contraception Wrap Up   Current Method Oral Contraceptive    End Method Oral Contraceptive    Contraception Counseling Provided No         The pregnancy intention screening data noted above was reviewed. Potential methods of contraception were discussed. The patient elected to proceed with Oral  Contraceptive.  Edinburgh Postpartum Depression Screening: Negative  Edinburgh Postnatal Depression Scale - 09/21/23 0949       Edinburgh Postnatal Depression Scale:  In the Past 7 Days   I have been able to laugh and see the funny side of things. 0    I have looked forward with enjoyment to things. 0    I have blamed myself unnecessarily when things went wrong. 0    I have been anxious or worried for no good reason. 2    I have felt scared or panicky for no good reason. 0    Things have been getting on top of me. 1    I have been so unhappy that I have had difficulty sleeping. 0    I have felt sad or miserable. 0    I have been so unhappy that I have been crying. 1    The thought of harming myself has occurred to me. 0    Edinburgh Postnatal Depression Scale Total 4         Baby's course has been uncomplicated. Baby is feeding by breast: milk supply adequate. Infant has a pediatrician/family doctor? Yes.  Childcare strategy if returning to work/school: yesi.  Pt has material needs met for her and baby: Yes.   Review of Systems:   Pertinent items are noted in HPI Denies Abnormal vaginal discharge w/ itching/odor/irritation, headaches, visual changes, shortness of breath, chest pain, abdominal pain, severe nausea/vomiting, or problems with urination or bowel movements. Pertinent History  Reviewed:  Reviewed past medical,surgical, obstetrical and family history.  Reviewed problem list, medications and allergies. OB History  Gravida Para Term Preterm AB Living  4 3 3  0 1 3  SAB IAB Ectopic Multiple Live Births  1 0 0 0 3    # Outcome Date GA Lbr Len/2nd Weight Sex Type Anes PTL Lv  4 Term 08/18/23 [redacted]w[redacted]d 16:06 / 00:19 8 lb 1.1 oz (3.66 kg) F Vag-Spont EPI  LIV  3 Term 12/26/21 [redacted]w[redacted]d 05:21 / 01:29 8 lb 2.2 oz (3.69 kg) M Vag-Spont EPI N LIV     Complications: Chronic hypertension, Gestational diabetes  2 Term 10/21/19 [redacted]w[redacted]d 12:38 / 02:08 7 lb 13 oz (3.544 kg) F Vag-Spont EPI N LIV      Birth Comments: WDL     Complications: Gestational diabetes, Chronic hypertension  1 SAB 07/01/18 [redacted]w[redacted]d            Birth Comments: 9.4 week sized fetus   Physical Assessment:   Vitals:   09/21/23 0951 09/21/23 1001  BP: (!) 141/86 120/81  Pulse: 66 66  Weight: 295 lb (133.8 kg)   Height: 5' 6 (1.676 m)   Body mass index is 47.61 kg/m.  Objective:  Blood pressure 120/81, pulse 66, height 5' 6 (1.676 m), weight 295 lb (133.8 kg), last menstrual period 11/15/2022, currently breastfeeding.  General:  alert, cooperative, and no distress   Breasts:  negative  Lungs: Normal respiratory effort  Heart:  regular rate and rhythm  Abdomen: soft, non-tender,    Vulva:  not evaluated  Vagina: not evaluated  Cervix:  normal  Corpus: Well involuted  Adnexa:  not evaluated  Rectal Exam: no hemorrhoids          No results found for this or any previous visit (from the past 24 hours).  Assessment & Plan:  1) Postpartum exam 2) 5 wks s/p spontaneous vaginal delivery 3) breast feeding 4) Depression screening 5) Contraception counseling  On POPs; husband had vasectomy, plans to stop POPs after his checkup  Essential components of care per ACOG recommendations:  1.  Mood and well being:  If positive depression screen, discussed and plan developed.  If using tobacco we discussed reduction/cessation and risk of relapse If current substance abuse, we discussed and referral to local resources was offered.   2. Infant care and feeding:  If breastfeeding, discussed returning to work, pumping, breastfeeding-associated pain, guidance regarding return to fertility while lactating if not using another method. If needed, patient was provided with a letter to be allowed to pump q 2-3hrs to support lactation in a private location with access to a refrigerator to store breastmilk.   Recommended that all caregivers be immunized for flu, pertussis and other preventable communicable diseases If pt  does not have material needs met for her/baby, referred to local resources for help obtaining these.  3. Sexuality, contraception and birth spacing Provided guidance regarding sexuality, management of dyspareunia, and resumption of intercourse Discussed avoiding interpregnancy interval <19mths and recommended birth spacing of 18 months  4. Sleep and fatigue Discussed coping options for fatigue and sleep disruption Encouraged family/partner/community support of 4 hrs of uninterrupted sleep to help with mood and fatigue  5. Physical recovery  If pt had a C/S, assessed incisional pain and providing guidance on normal vs prolonged recovery If pt had a laceration, perineal healing and pain reviewed.  If urinary or fecal incontinence, discussed management and referred to PT or uro/gyn if indicated  Patient is  safe to resume physical activity. Discussed attainment of healthy weight.  6.  Chronic disease management Discussed pregnancy complications if any, and their implications for future childbearing and long-term maternal health. Review recommendations for prevention of recurrent pregnancy complications, such as aspirin  to reduce risk of preeclampsia yes. Pt had GDM: No. If yes, 2hr GTT scheduled: not applicable. Reviewed medications and non-pregnant dosing including consideration of whether pt is breastfeeding using a reliable resource such as [employer]: not applicable Referred for f/u w/ PCP or subspecialist providers as indicated: not applicable  7. Health maintenance Mammogram at 31yo or earlier if indicated Pap smears as indicated  Meds: No orders of the defined types were placed in this encounter.   Follow-up: No follow-ups on file.   No orders of the defined types were placed in this encounter.   Take BP at home. If consistently >135/85, make appt w/PCP.    Cathlean Ely DNP, CNM [facility] [employer], [facility] 09/21/2023 10:20 AM

## 2023-09-21 NOTE — Patient Instructions (Addendum)
[facility]:        [employer] [phone]                 [facility] [phone] (usually not accepting new patients unless you have family there already, you are always welcome to call and ask)       [facility] Department [phone]    [facility]:  [phone]  [facility]:  [phone]     [facility] Doctors:   [facility]: [phone]  [facility]: [phone]  [facility]:   [facility]: [phone]   [facility]: [phone]  [facility] Doctors:  [facility]: [phone]

## 2024-01-13 ENCOUNTER — Other Ambulatory Visit: Payer: Self-pay | Admitting: Women's Health
